# Patient Record
Sex: Female | Born: 1975
Health system: Southern US, Community
[De-identification: ages and names within clinical notes are randomized; demographics above are authoritative.]

## PROBLEM LIST (undated history)

## (undated) DIAGNOSIS — I871 Compression of vein: Secondary | ICD-10-CM

## (undated) DIAGNOSIS — Z8489 Family history of other specified conditions: Secondary | ICD-10-CM

## (undated) DIAGNOSIS — F329 Major depressive disorder, single episode, unspecified: Secondary | ICD-10-CM

## (undated) DIAGNOSIS — D649 Anemia, unspecified: Secondary | ICD-10-CM

## (undated) DIAGNOSIS — I82409 Acute embolism and thrombosis of unspecified deep veins of unspecified lower extremity: Secondary | ICD-10-CM

## (undated) DIAGNOSIS — F4310 Post-traumatic stress disorder, unspecified: Secondary | ICD-10-CM

## (undated) DIAGNOSIS — M797 Fibromyalgia: Secondary | ICD-10-CM

## (undated) DIAGNOSIS — F603 Borderline personality disorder: Secondary | ICD-10-CM

## (undated) DIAGNOSIS — T753XXA Motion sickness, initial encounter: Secondary | ICD-10-CM

## (undated) DIAGNOSIS — Z972 Presence of dental prosthetic device (complete) (partial): Secondary | ICD-10-CM

## (undated) DIAGNOSIS — F32A Depression, unspecified: Secondary | ICD-10-CM

## (undated) DIAGNOSIS — Z95828 Presence of other vascular implants and grafts: Secondary | ICD-10-CM

## (undated) DIAGNOSIS — J45909 Unspecified asthma, uncomplicated: Secondary | ICD-10-CM

## (undated) HISTORY — PX: IVC FILTER PLACEMENT (ARMC HX): HXRAD1551

## (undated) HISTORY — PX: CHOLECYSTECTOMY: SHX55

## (undated) HISTORY — PX: LEG SURGERY: SHX1003

## (undated) HISTORY — PX: BREAST CYST ASPIRATION: SHX578

## (undated) HISTORY — PX: TUBAL LIGATION: SHX77

---

## 2004-06-16 ENCOUNTER — Emergency Department: Payer: Self-pay | Admitting: Emergency Medicine

## 2005-07-10 ENCOUNTER — Emergency Department: Payer: Self-pay | Admitting: Emergency Medicine

## 2006-03-05 ENCOUNTER — Emergency Department: Payer: Self-pay | Admitting: Emergency Medicine

## 2006-07-30 ENCOUNTER — Emergency Department: Payer: Self-pay | Admitting: General Practice

## 2006-10-05 ENCOUNTER — Emergency Department: Payer: Self-pay | Admitting: Internal Medicine

## 2006-11-22 ENCOUNTER — Emergency Department: Payer: Self-pay | Admitting: Emergency Medicine

## 2007-10-27 ENCOUNTER — Emergency Department: Payer: Self-pay | Admitting: Emergency Medicine

## 2008-01-16 ENCOUNTER — Emergency Department: Payer: Self-pay | Admitting: Emergency Medicine

## 2008-06-20 ENCOUNTER — Emergency Department: Payer: Self-pay | Admitting: Emergency Medicine

## 2008-08-20 ENCOUNTER — Emergency Department: Payer: Self-pay | Admitting: Unknown Physician Specialty

## 2008-11-05 ENCOUNTER — Emergency Department: Payer: Self-pay | Admitting: Emergency Medicine

## 2009-01-06 ENCOUNTER — Ambulatory Visit: Payer: Self-pay | Admitting: Oncology

## 2009-01-25 ENCOUNTER — Inpatient Hospital Stay: Payer: Self-pay | Admitting: *Deleted

## 2009-02-03 ENCOUNTER — Emergency Department: Payer: Self-pay | Admitting: Unknown Physician Specialty

## 2009-02-06 ENCOUNTER — Ambulatory Visit: Payer: Self-pay | Admitting: Oncology

## 2009-02-09 ENCOUNTER — Ambulatory Visit: Payer: Self-pay | Admitting: Oncology

## 2009-03-09 ENCOUNTER — Ambulatory Visit: Payer: Self-pay | Admitting: Oncology

## 2009-03-18 ENCOUNTER — Ambulatory Visit: Payer: Self-pay | Admitting: Pain Medicine

## 2009-03-22 ENCOUNTER — Ambulatory Visit: Payer: Self-pay | Admitting: Pain Medicine

## 2009-04-20 ENCOUNTER — Ambulatory Visit: Payer: Self-pay | Admitting: Pain Medicine

## 2009-05-05 ENCOUNTER — Ambulatory Visit: Payer: Self-pay | Admitting: Pain Medicine

## 2009-06-01 ENCOUNTER — Ambulatory Visit: Payer: Self-pay | Admitting: Pain Medicine

## 2009-09-23 ENCOUNTER — Emergency Department: Payer: Self-pay | Admitting: Emergency Medicine

## 2010-04-25 ENCOUNTER — Emergency Department: Payer: Self-pay | Admitting: Emergency Medicine

## 2010-06-15 DIAGNOSIS — F4310 Post-traumatic stress disorder, unspecified: Secondary | ICD-10-CM | POA: Insufficient documentation

## 2010-06-15 DIAGNOSIS — F603 Borderline personality disorder: Secondary | ICD-10-CM | POA: Insufficient documentation

## 2010-06-15 DIAGNOSIS — I82509 Chronic embolism and thrombosis of unspecified deep veins of unspecified lower extremity: Secondary | ICD-10-CM | POA: Insufficient documentation

## 2010-06-15 DIAGNOSIS — G609 Hereditary and idiopathic neuropathy, unspecified: Secondary | ICD-10-CM | POA: Insufficient documentation

## 2010-07-05 DIAGNOSIS — I87009 Postthrombotic syndrome without complications of unspecified extremity: Secondary | ICD-10-CM | POA: Insufficient documentation

## 2010-11-02 DIAGNOSIS — F1190 Opioid use, unspecified, uncomplicated: Secondary | ICD-10-CM | POA: Insufficient documentation

## 2011-02-01 ENCOUNTER — Emergency Department: Payer: Self-pay | Admitting: Emergency Medicine

## 2011-03-04 ENCOUNTER — Emergency Department: Payer: Self-pay | Admitting: Emergency Medicine

## 2011-03-04 LAB — BASIC METABOLIC PANEL
BUN: 5 mg/dL — ABNORMAL LOW (ref 7–18)
Chloride: 108 mmol/L — ABNORMAL HIGH (ref 98–107)
Creatinine: 0.73 mg/dL (ref 0.60–1.30)
EGFR (Non-African Amer.): >60
Glucose: 116 mg/dL — ABNORMAL HIGH (ref 65–99)
Osmolality: 287 (ref 275–301)
Potassium: 4 mmol/L (ref 3.5–5.1)
Sodium: 145 mmol/L (ref 136–145)

## 2011-03-04 LAB — CBC
HCT: 39.6 % (ref 35.0–47.0)
HGB: 13 g/dL (ref 12.0–16.0)
MCV: 89 fL (ref 80–100)
Platelet: 373 10*3/uL (ref 150–440)
RBC: 4.42 10*6/uL (ref 3.80–5.20)
RDW: 17.1 % — ABNORMAL HIGH (ref 11.5–14.5)
WBC: 9.6 10*3/uL (ref 3.6–11.0)

## 2011-03-04 LAB — HEPATIC FUNCTION PANEL A (ARMC)
Bilirubin, Direct: <0.1 mg/dL (ref 0.00–0.20)
Bilirubin,Total: 0.4 mg/dL (ref 0.2–1.0)

## 2011-03-04 LAB — LIPASE, BLOOD: Lipase: 52 U/L — ABNORMAL LOW (ref 73–393)

## 2011-03-04 LAB — PROTIME-INR: INR: 1.2

## 2011-05-08 ENCOUNTER — Emergency Department: Payer: Self-pay | Admitting: Emergency Medicine

## 2011-05-08 LAB — CBC WITH DIFFERENTIAL/PLATELET
Basophil %: 1.1 %
Eosinophil %: 1.3 %
HCT: 41.8 % (ref 35.0–47.0)
Lymphocyte %: 32.1 %
MCV: 88 fL (ref 80–100)
Monocyte %: 5.6 %
Neutrophil #: 8.8 10*3/uL — ABNORMAL HIGH (ref 1.4–6.5)
Neutrophil %: 59.9 %
RBC: 4.74 10*6/uL (ref 3.80–5.20)
RDW: 16.6 % — ABNORMAL HIGH (ref 11.5–14.5)

## 2011-05-08 LAB — URINALYSIS, COMPLETE
RBC,UR: 3014 /HPF (ref 0–5)
Squamous Epithelial: 1
Squamous Epithelial: 2
WBC UR: 37 /HPF (ref 0–5)
WBC UR: 14 /HPF (ref 0–5)

## 2011-05-08 LAB — COMPREHENSIVE METABOLIC PANEL
Albumin: 3.5 g/dL (ref 3.4–5.0)
BUN: 9 mg/dL (ref 7–18)
Bilirubin,Total: 0.4 mg/dL (ref 0.2–1.0)
Calcium, Total: 8.9 mg/dL (ref 8.5–10.1)
Creatinine: 0.96 mg/dL (ref 0.60–1.30)
Glucose: 114 mg/dL — ABNORMAL HIGH (ref 65–99)
Osmolality: 275 (ref 275–301)
SGOT(AST): 29 U/L (ref 15–37)
SGPT (ALT): 27 U/L
Sodium: 138 mmol/L (ref 136–145)
Total Protein: 7.8 g/dL (ref 6.4–8.2)

## 2011-05-08 LAB — DRUG SCREEN, URINE
Amphetamines, Ur Screen: NEGATIVE (ref ?–1000)
Barbiturates, Ur Screen: NEGATIVE (ref ?–200)
Cannabinoid 50 Ng, Ur ~~LOC~~: NEGATIVE (ref ?–50)
Cocaine Metabolite,Ur ~~LOC~~: NEGATIVE (ref ?–300)
MDMA (Ecstasy)Ur Screen: NEGATIVE (ref ?–500)
Methadone, Ur Screen: NEGATIVE (ref ?–300)
Phencyclidine (PCP) Ur S: NEGATIVE (ref ?–25)
Tricyclic, Ur Screen: NEGATIVE (ref ?–1000)

## 2011-05-08 LAB — PREGNANCY, URINE: Pregnancy Test, Urine: NEGATIVE m[IU]/mL

## 2011-05-10 LAB — URINE CULTURE

## 2012-01-22 DIAGNOSIS — G4700 Insomnia, unspecified: Secondary | ICD-10-CM | POA: Insufficient documentation

## 2012-01-22 DIAGNOSIS — J984 Other disorders of lung: Secondary | ICD-10-CM | POA: Insufficient documentation

## 2012-01-22 DIAGNOSIS — G8929 Other chronic pain: Secondary | ICD-10-CM | POA: Insufficient documentation

## 2012-01-22 DIAGNOSIS — F1210 Cannabis abuse, uncomplicated: Secondary | ICD-10-CM | POA: Insufficient documentation

## 2012-01-22 DIAGNOSIS — I2699 Other pulmonary embolism without acute cor pulmonale: Secondary | ICD-10-CM | POA: Insufficient documentation

## 2012-01-22 DIAGNOSIS — Z720 Tobacco use: Secondary | ICD-10-CM | POA: Insufficient documentation

## 2012-01-22 DIAGNOSIS — F32A Depression, unspecified: Secondary | ICD-10-CM | POA: Insufficient documentation

## 2012-01-22 DIAGNOSIS — F329 Major depressive disorder, single episode, unspecified: Secondary | ICD-10-CM | POA: Insufficient documentation

## 2012-01-22 DIAGNOSIS — M545 Low back pain, unspecified: Secondary | ICD-10-CM | POA: Insufficient documentation

## 2012-01-22 DIAGNOSIS — M797 Fibromyalgia: Secondary | ICD-10-CM | POA: Insufficient documentation

## 2012-01-22 DIAGNOSIS — F489 Nonpsychotic mental disorder, unspecified: Secondary | ICD-10-CM | POA: Insufficient documentation

## 2012-07-02 ENCOUNTER — Emergency Department: Payer: Self-pay | Admitting: Emergency Medicine

## 2012-07-02 LAB — CBC
HCT: 31.4 % — ABNORMAL LOW (ref 35.0–47.0)
HGB: 10.1 g/dL — ABNORMAL LOW (ref 12.0–16.0)
MCHC: 32.1 g/dL (ref 32.0–36.0)
Platelet: 658 10*3/uL — ABNORMAL HIGH (ref 150–440)
RBC: 4.5 10*6/uL (ref 3.80–5.20)
RDW: 20.7 % — ABNORMAL HIGH (ref 11.5–14.5)
WBC: 14.1 10*3/uL — ABNORMAL HIGH (ref 3.6–11.0)

## 2012-07-02 LAB — COMPREHENSIVE METABOLIC PANEL
Alkaline Phosphatase: 149 U/L — ABNORMAL HIGH (ref 50–136)
Anion Gap: 7 (ref 7–16)
Bilirubin,Total: 0.2 mg/dL (ref 0.2–1.0)
Calcium, Total: 9.2 mg/dL (ref 8.5–10.1)
Chloride: 105 mmol/L (ref 98–107)
Co2: 25 mmol/L (ref 21–32)
Creatinine: 0.68 mg/dL (ref 0.60–1.30)
EGFR (Non-African Amer.): >60
Osmolality: 272 (ref 275–301)
SGPT (ALT): 14 U/L (ref 12–78)
Total Protein: 7.6 g/dL (ref 6.4–8.2)

## 2012-07-06 ENCOUNTER — Emergency Department: Payer: Self-pay | Admitting: Emergency Medicine

## 2012-07-06 LAB — COMPREHENSIVE METABOLIC PANEL
Alkaline Phosphatase: 147 U/L — ABNORMAL HIGH (ref 50–136)
Anion Gap: 6 — ABNORMAL LOW (ref 7–16)
BUN: 10 mg/dL (ref 7–18)
Calcium, Total: 8.6 mg/dL (ref 8.5–10.1)
EGFR (African American): >60
Osmolality: 280 (ref 275–301)
Potassium: 3.9 mmol/L (ref 3.5–5.1)
SGOT(AST): 11 U/L — ABNORMAL LOW (ref 15–37)
SGPT (ALT): 9 U/L — ABNORMAL LOW (ref 12–78)

## 2012-07-06 LAB — CBC
HCT: 29.4 % — ABNORMAL LOW (ref 35.0–47.0)
HGB: 9.2 g/dL — ABNORMAL LOW (ref 12.0–16.0)
MCHC: 31.3 g/dL — ABNORMAL LOW (ref 32.0–36.0)
MCV: 70 fL — ABNORMAL LOW (ref 80–100)
Platelet: 375 10*3/uL (ref 150–440)
RBC: 4.18 10*6/uL (ref 3.80–5.20)

## 2012-07-06 LAB — PROTIME-INR
INR: 1
Prothrombin Time: 13.1 secs (ref 11.5–14.7)

## 2012-07-13 DIAGNOSIS — F3341 Major depressive disorder, recurrent, in partial remission: Secondary | ICD-10-CM | POA: Insufficient documentation

## 2012-10-19 DIAGNOSIS — D649 Anemia, unspecified: Secondary | ICD-10-CM | POA: Insufficient documentation

## 2012-11-01 ENCOUNTER — Emergency Department: Payer: Self-pay | Admitting: Emergency Medicine

## 2012-11-02 LAB — CBC
HCT: 36.8 % (ref 35.0–47.0)
HGB: 11.8 g/dL — ABNORMAL LOW (ref 12.0–16.0)
MCH: 24.8 pg — ABNORMAL LOW (ref 26.0–34.0)
MCHC: 32.1 g/dL (ref 32.0–36.0)
Platelet: 481 10*3/uL — ABNORMAL HIGH (ref 150–440)
RBC: 4.75 10*6/uL (ref 3.80–5.20)
RDW: 32.3 % — ABNORMAL HIGH (ref 11.5–14.5)
WBC: 12.2 10*3/uL — ABNORMAL HIGH (ref 3.6–11.0)

## 2012-11-02 LAB — COMPREHENSIVE METABOLIC PANEL
BUN: 14 mg/dL (ref 7–18)
Bilirubin,Total: 0.3 mg/dL (ref 0.2–1.0)
Calcium, Total: 9.5 mg/dL (ref 8.5–10.1)
EGFR (African American): >60
Glucose: 128 mg/dL — ABNORMAL HIGH (ref 65–99)
Osmolality: 270 (ref 275–301)
Potassium: 3.8 mmol/L (ref 3.5–5.1)
SGPT (ALT): 17 U/L (ref 12–78)
Sodium: 134 mmol/L — ABNORMAL LOW (ref 136–145)

## 2012-11-02 LAB — URINALYSIS, COMPLETE
Glucose,UR: NEGATIVE mg/dL (ref 0–75)
Hyaline Cast: 10
Leukocyte Esterase: NEGATIVE
RBC,UR: 2 /HPF (ref 0–5)
Specific Gravity: 1.029 (ref 1.003–1.030)
WBC UR: 10 /HPF (ref 0–5)

## 2012-11-02 LAB — LIPASE, BLOOD: Lipase: 130 U/L (ref 73–393)

## 2013-06-20 DIAGNOSIS — I770 Arteriovenous fistula, acquired: Secondary | ICD-10-CM | POA: Insufficient documentation

## 2013-11-21 DIAGNOSIS — M2550 Pain in unspecified joint: Secondary | ICD-10-CM | POA: Insufficient documentation

## 2014-06-18 ENCOUNTER — Ambulatory Visit (HOSPITAL_COMMUNITY)
Admission: RE | Admit: 2014-06-18 | Discharge: 2014-06-18 | Disposition: A | Discharge status: Home / Self Care | Payer: Self-pay | Source: Ambulatory Visit | Attending: Family Medicine | Admitting: Family Medicine

## 2014-06-18 DIAGNOSIS — Z8269 Family history of other diseases of the musculoskeletal system and connective tissue: Secondary | ICD-10-CM | POA: Insufficient documentation

## 2014-06-18 DIAGNOSIS — Z31430 Encounter of female for testing for genetic disease carrier status for procreative management: Secondary | ICD-10-CM | POA: Insufficient documentation

## 2014-07-24 ENCOUNTER — Other Ambulatory Visit (HOSPITAL_COMMUNITY): Payer: Self-pay | Admitting: Maternal and Fetal Medicine

## 2015-02-09 ENCOUNTER — Inpatient Hospital Stay
Admission: EM | Admit: 2015-02-09 | Discharge: 2015-02-09 | DRG: 081 | Disposition: A | Discharge status: Home / Self Care | Payer: PPO | Attending: Internal Medicine | Admitting: Internal Medicine

## 2015-02-09 ENCOUNTER — Emergency Department: Payer: PPO

## 2015-02-09 ENCOUNTER — Encounter: Payer: Self-pay | Admitting: Emergency Medicine

## 2015-02-09 DIAGNOSIS — Z888 Allergy status to other drugs, medicaments and biological substances status: Secondary | ICD-10-CM | POA: Diagnosis not present

## 2015-02-09 DIAGNOSIS — R4781 Slurred speech: Secondary | ICD-10-CM | POA: Diagnosis not present

## 2015-02-09 DIAGNOSIS — R4182 Altered mental status, unspecified: Secondary | ICD-10-CM

## 2015-02-09 DIAGNOSIS — Z86718 Personal history of other venous thrombosis and embolism: Secondary | ICD-10-CM

## 2015-02-09 DIAGNOSIS — F17200 Nicotine dependence, unspecified, uncomplicated: Secondary | ICD-10-CM | POA: Diagnosis not present

## 2015-02-09 DIAGNOSIS — Z886 Allergy status to analgesic agent status: Secondary | ICD-10-CM

## 2015-02-09 DIAGNOSIS — Z881 Allergy status to other antibiotic agents status: Secondary | ICD-10-CM

## 2015-02-09 DIAGNOSIS — W1830XA Fall on same level, unspecified, initial encounter: Secondary | ICD-10-CM | POA: Diagnosis present

## 2015-02-09 DIAGNOSIS — T43025A Adverse effect of tetracyclic antidepressants, initial encounter: Secondary | ICD-10-CM | POA: Diagnosis not present

## 2015-02-09 DIAGNOSIS — F419 Anxiety disorder, unspecified: Secondary | ICD-10-CM | POA: Diagnosis present

## 2015-02-09 DIAGNOSIS — R400 Somnolence: Principal | ICD-10-CM | POA: Diagnosis present

## 2015-02-09 DIAGNOSIS — I82519 Chronic embolism and thrombosis of unspecified femoral vein: Secondary | ICD-10-CM | POA: Diagnosis not present

## 2015-02-09 DIAGNOSIS — Y92000 Kitchen of unspecified non-institutional (private) residence as  the place of occurrence of the external cause: Secondary | ICD-10-CM | POA: Diagnosis not present

## 2015-02-09 DIAGNOSIS — T50901A Poisoning by unspecified drugs, medicaments and biological substances, accidental (unintentional), initial encounter: Secondary | ICD-10-CM | POA: Diagnosis present

## 2015-02-09 DIAGNOSIS — F329 Major depressive disorder, single episode, unspecified: Secondary | ICD-10-CM | POA: Diagnosis not present

## 2015-02-09 LAB — URINE DRUG SCREEN, QUALITATIVE (ARMC ONLY)
AMPHETAMINES, UR SCREEN: NOT DETECTED
Barbiturates, Ur Screen: NOT DETECTED
Benzodiazepine, Ur Scrn: NOT DETECTED
CANNABINOID 50 NG, UR ~~LOC~~: POSITIVE — AB
COCAINE METABOLITE, UR ~~LOC~~: NOT DETECTED
MDMA (ECSTASY) UR SCREEN: NOT DETECTED
Methadone Scn, Ur: NOT DETECTED
OPIATE, UR SCREEN: NOT DETECTED
PHENCYCLIDINE (PCP) UR S: NOT DETECTED
Tricyclic, Ur Screen: NOT DETECTED

## 2015-02-09 LAB — BASIC METABOLIC PANEL
ANION GAP: 6 (ref 5–15)
BUN: 18 mg/dL (ref 6–20)
CALCIUM: 9.4 mg/dL (ref 8.9–10.3)
CHLORIDE: 104 mmol/L (ref 101–111)
CO2: 30 mmol/L (ref 22–32)
Creatinine, Ser: 0.89 mg/dL (ref 0.44–1.00)
GFR calc Af Amer: >60 mL/min (ref >60)
GFR calc non Af Amer: >60 mL/min (ref >60)
GLUCOSE: 100 mg/dL — AB (ref 65–99)
Potassium: 4.3 mmol/L (ref 3.5–5.1)
Sodium: 140 mmol/L (ref 135–145)

## 2015-02-09 LAB — CBC
HEMATOCRIT: 38 % (ref 35.0–47.0)
HEMOGLOBIN: 12.8 g/dL (ref 12.0–16.0)
MCH: 32.5 pg (ref 26.0–34.0)
MCHC: 33.6 g/dL (ref 32.0–36.0)
MCV: 96.7 fL (ref 80.0–100.0)
Platelets: 391 10*3/uL (ref 150–440)
RBC: 3.93 MIL/uL (ref 3.80–5.20)
RDW: 16.2 % — ABNORMAL HIGH (ref 11.5–14.5)
WBC: 13.3 10*3/uL — ABNORMAL HIGH (ref 3.6–11.0)

## 2015-02-09 LAB — TROPONIN I

## 2015-02-09 MED ORDER — POLYETHYLENE GLYCOL 3350 17 G PO PACK: 17.0000 g | PACK | Freq: Every day | ORAL | Status: DC | PRN

## 2015-02-09 MED ORDER — ACETAMINOPHEN 650 MG RE SUPP: 650.0000 mg | Freq: Four times a day (QID) | RECTAL | Status: DC | PRN

## 2015-02-09 MED ORDER — ACETAMINOPHEN 325 MG PO TABS: 650.0000 mg | ORAL_TABLET | Freq: Four times a day (QID) | ORAL | Status: DC | PRN

## 2015-02-09 MED ORDER — ONDANSETRON HCL 4 MG PO TABS: 4.0000 mg | ORAL_TABLET | Freq: Four times a day (QID) | ORAL | Status: DC | PRN

## 2015-02-09 MED ORDER — ONDANSETRON HCL 4 MG/2ML IJ SOLN: 4.0000 mg | Freq: Four times a day (QID) | INTRAMUSCULAR | Status: DC | PRN

## 2015-02-09 MED ORDER — SODIUM CHLORIDE 0.9 % IV SOLN: INTRAVENOUS | Status: DC

## 2015-02-09 MED ORDER — BISACODYL 5 MG PO TBEC: 5.0000 mg | DELAYED_RELEASE_TABLET | Freq: Every day | ORAL | Status: DC | PRN

## 2015-02-09 MED ORDER — ALBUTEROL SULFATE (2.5 MG/3ML) 0.083% IN NEBU: 2.5000 mg | INHALATION_SOLUTION | RESPIRATORY_TRACT | Status: DC | PRN

## 2015-02-09 MED ORDER — SODIUM CHLORIDE 0.9 % IJ SOLN: 3.0000 mL | Freq: Two times a day (BID) | INTRAMUSCULAR | Status: DC
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
## 2015-02-09 NOTE — ED Notes (Signed)
Pt went to CT

## 2015-02-09 NOTE — ED Provider Notes (Signed)
[facility] Emergency Department Provider Note  ____________________________________________  Time seen: 12:40 AM  I have reviewed the triage vital signs and the nursing notes.  History Limited secondary to altered mental status HISTORY  Chief Complaint Altered Mental Status    HPI Lindsey Willis is a 40 y.o. female presents via EMS with altered mental status. Per EMS initial call was for loss of consciousness and patient was found to be minimally conscious on the presentation. EMS personnel stated of 2 mg of Narcan was given at which time the patient alertness improved and she was able to converse 8. Patient very somnolent on presentation to the emergency department with a respiratory rate of 10. Patient admits to recent stent placement in her left lower extremity at outside hospital. Patient states that she "feels fine"    Past medical history  There are no active problems to display for this patient.   Past surgical history Bilateral lower extremity vascular stent placement No current outpatient prescriptions on file.  Allergies Review of patient's allergies indicates not on file.  No family history on file.  Social History Social History  Substance Use Topics  . Smoking status: Not on file  . Smokeless tobacco: Not on file  . Alcohol Use: Not on file    Review of Systems  Constitutional: Negative for fever. Eyes: Negative for visual changes. ENT: Negative for sore throat. Cardiovascular: Negative for chest pain. Respiratory: Negative for shortness of breath. Gastrointestinal: Negative for abdominal pain, vomiting and diarrhea. Genitourinary: Negative for dysuria. Musculoskeletal: Negative for back pain. Skin: Negative for rash. Neurological: Negative for headaches, focal weakness or numbness.   10-point ROS otherwise negative.  ____________________________________________   PHYSICAL EXAM:  VITAL SIGNS: ED Triage Vitals  Enc  Vitals Group     BP --      Pulse --      Resp --      Temp --      Temp src --      SpO2 --      Weight --      Height --      Head Cir --      Peak Flow --      Pain Score --      Pain Loc --      Pain Edu? --      Excl. in GC? --     Constitutional: Very somnolent but arousable to verbal stimuli with rapid return to sleep Eyes: Conjunctivae are normal. PERRL. Normal extraocular movements. ENT   Head: Normocephalic and atraumatic.   Nose: No congestion/rhinnorhea.   Mouth/Throat: Mucous membranes are moist.   Neck: No stridor. Hematological/Lymphatic/Immunilogical: No cervical lymphadenopathy. Cardiovascular: Normal rate, regular rhythm. Normal and symmetric distal pulses are present in all extremities. No murmurs, rubs, or gallops. Respiratory: Normal respiratory effort without tachypnea nor retractions. Breath sounds are clear and equal bilaterally. No wheezes/rales/rhonchi. Gastrointestinal: Soft and nontender. No distention. There is no CVA tenderness. Genitourinary: deferred Musculoskeletal: Nontender with normal range of motion in all extremities. No joint effusions.  No lower extremity tenderness nor edema. Neurologic:  Slurred speech. No gross focal neurologic deficits are appreciated. Speech is normal.  Skin:  Skin is warm, dry and intact. No rash noted. Psychiatric: Mood and affect are normal. Speech and behavior are normal. Patient exhibits appropriate insight and judgment.  ____________________________________________    LABS (pertinent positives/negatives)  Labs Reviewed  BASIC METABOLIC PANEL - Abnormal; Notable for the following:    Glucose,  Bld 100 (*)    All other components within normal limits  CBC - Abnormal; Notable for the following:    WBC 13.3 (*)    RDW 16.2 (*)    All other components within normal limits  URINE DRUG SCREEN, QUALITATIVE ([facility] ONLY) - Abnormal; Notable for the following:    Cannabinoid 50 Ng, Ur [place] POSITIVE (*)     All other components within normal limits  TROPONIN I     ____________________________________________   EKG  ED ECG REPORT I, Coner Gibbard, [place], the attending physician, personally viewed and interpreted this ECG.   Date: 02/09/2015  EKG Time: 12:30 AM  Rate: 75  Rhythm: Normal sinus rhythm  Axis: None  Intervals: Normal  ST&T Change: None   ____________________________________________    RADIOLOGY  CT Head Wo Contrast (Final result) Result time: 02/09/15 02:35:03   Final result by Rad Results In Interface (02/09/15 02:35:03)   Narrative:   CLINICAL DATA: Acute onset of loss of consciousness. Slurred speech. Initial encounter.  EXAM: CT HEAD WITHOUT CONTRAST  TECHNIQUE: Contiguous axial images were obtained from the base of the skull through the vertex without intravenous contrast.  COMPARISON: CT of the head performed 01/25/2009  FINDINGS: There is no evidence of acute infarction, mass lesion, or intra- or extra-axial hemorrhage on CT.  The posterior fossa, including the cerebellum, brainstem and fourth ventricle, is within normal limits. The third and lateral ventricles, and basal ganglia are unremarkable in appearance. The cerebral hemispheres are symmetric in appearance, with normal gray-white differentiation. No mass effect or midline shift is seen.  There is no evidence of fracture; visualized osseous structures are unremarkable in appearance. The orbits are within normal limits. Mucosal thickening is noted at the right maxillary sinus. The remaining paranasal sinuses and mastoid air cells are well-aerated. No significant soft tissue abnormalities are seen.  IMPRESSION: 1. No acute intracranial pathology seen on CT. 2. Mucosal thickening at the right maxillary sinus.   Electronically Signed By: Roanna Raider M.D. On: 02/09/2015 02:35        INITIAL IMPRESSION / ASSESSMENT AND PLAN / ED COURSE  Pertinent labs & imaging  results that were available during my care of the patient were reviewed by me and considered in my medical decision making (see chart for details).  Patient remained very somnolent but aroused to verbal stimuli with rapid return to sleep during emergency Department stay laboratory data positive for marijuana. Patient admits to taking 1 Percocet yesterday. No clear etiology for the patient's profound somnolence and slurred speech. I discussed with the patient and her daughters which she gave permission for me to speak freely in front of them that I recommend she be admitted to the hospital for further evaluation. Patient and agrees to plan. Patient evaluated by Dr. Marlene Bast at which point she adamantly stated that she wants to leave AGAINST MEDICAL ADVICE ____________________________________________   FINAL CLINICAL IMPRESSION(S) / ED DIAGNOSES  Final diagnoses:  Altered mental status, unspecified altered mental status type      Darci Current, MD 02/09/15 [id number]

## 2015-02-09 NOTE — Consult Note (Signed)
[facility] [place] at [facility]   PATIENT NAME: Lindsey Willis    MR#:  [id number]  DATE OF BIRTH:  [birth year]  DATE OF ADMISSION:  02/09/2015  PRIMARY CARE PHYSICIAN: No primary care provider on file.   CONSULT REQUESTING/REFERRING PHYSICIAN: Dr. Manson Passey - ED  REASON FOR CONSULT: Drowziness  CHIEF COMPLAINT:   Chief Complaint  Patient presents with  . Altered Mental Status    HISTORY OF PRESENT ILLNESS:  Lindsey Willis  is a 40 y.o. female with a known history of depression, DVT, anxiety presented to the emergency room after family found her on the floor in the kitchen extremely drowsy. Patient mentions that she has been feeling lightheaded with increased somnolence since starting Remeron 15 mg daily which was prescribed in place of Abilify recently by her primary care physician. She was trying to fix herself some fluid in the kitchen felt lightheaded and fell to the floor. Patient has been drowsy in the emergency room. Presently patient is alert and oriented 3 and is able to clearly give me history off her medication changes and also regarding her recent hospital admission at [facility]. No suicidal ideation. Patient has ambulated on her own to the bathroom and back. She does have some pain in her left lower extremity for which she has been investigated at [facility] and has chronic DVT.  PAST MEDICAL HISTORY:  History reviewed. No pertinent past medical history.  PAST SURGICAL HISTOIRY:  History reviewed. No pertinent past surgical history.  SOCIAL HISTORY:   Social History  Substance Use Topics  . Smoking status: Current Every Day Smoker  . Smokeless tobacco: Not on file  . Alcohol Use: No    FAMILY HISTORY:  History reviewed. No pertinent family history.  DRUG ALLERGIES:   Allergies  Allergen Reactions  . Vicodin [Hydrocodone-Acetaminophen] Other (See Comments)    Liver problems   . Iodine Rash  . Keflex [Cephalexin] Rash  . Levaquin [Levofloxacin In  D5w] Rash  . Percocet [Oxycodone-Acetaminophen] Rash    REVIEW OF SYSTEMS:   ROS  CONSTITUTIONAL: No fever. Positive Fatigue. EYES: No blurred or double vision.  EARS, NOSE, AND THROAT: No tinnitus or ear pain.  RESPIRATORY: No cough, shortness of breath, wheezing or hemoptysis.  CARDIOVASCULAR: No chest pain, orthopnea, edema.  GASTROINTESTINAL: No nausea, vomiting, diarrhea or abdominal pain.  GENITOURINARY: No dysuria, hematuria.  ENDOCRINE: No polyuria, nocturia,  HEMATOLOGY: No anemia, easy bruising or bleeding. SKIN: No rash or lesion. MUSCULOSKELETAL: No joint pain or arthritis. NEUROLOGIC: No tingling, numbness, weakness. PSYCHIATRY: anxiety and depression.  MEDICATIONS AT HOME:   Prior to Admission medications   Medication Sig Start Date End Date Taking? Authorizing Provider  albuterol (PROVENTIL HFA;VENTOLIN HFA) 108 (90 Base) MCG/ACT inhaler Inhale 1-2 puffs into the lungs every 6 (six) hours as needed for wheezing or shortness of breath (Patient does not use but is active prescription).   Yes Historical Provider, MD  clonazePAM (KLONOPIN) 0.5 MG tablet Take 0.5 mg by mouth 2 (two) times daily.   Yes Historical Provider, MD  clopidogrel (PLAVIX) 75 MG tablet Take 75 mg by mouth daily.   Yes Historical Provider, MD  DULoxetine (CYMBALTA) 60 MG capsule Take 60 mg by mouth daily.   Yes Historical Provider, MD  gabapentin (NEURONTIN) 800 MG tablet Take 800 mg by mouth 4 (four) times daily.   Yes Historical Provider, MD  mirtazapine (REMERON) 15 MG tablet Take 30 mg by mouth at bedtime.   Yes Historical Provider, MD  rivaroxaban (XARELTO) 20 MG TABS tablet Take 20 mg by mouth daily.   Yes Historical Provider, MD  rOPINIRole (REQUIP) 1 MG tablet Take 1 mg by mouth at bedtime.   Yes Historical Provider, MD  tiotropium (SPIRIVA) 18 MCG inhalation capsule Place 18 mcg into inhaler and inhale daily. Active prescription, patient just doesn't use   Yes Historical Provider, MD       VITAL SIGNS:  Blood pressure 106/66, pulse 83, temperature 97.5 F (36.4 C), temperature source Oral, resp. rate 16, height 5\' 3"  (1.6 m), weight 83.915 kg (185 lb), last menstrual period 01/27/2015, SpO2 95 %.  PHYSICAL EXAMINATION:  GENERAL:  40 y.o.-year-old patient lying in the bed with no acute distress.  EYES: Pupils equal, round, reactive to light and accommodation. No scleral icterus. Extraocular muscles intact.  HEENT: Head atraumatic, normocephalic. Oropharynx and nasopharynx clear.  NECK:  Supple, no jugular venous distention. No thyroid enlargement, no tenderness.  LUNGS: Normal breath sounds bilaterally, no wheezing, rales,rhonchi or crepitation. No use of accessory muscles of respiration.  CARDIOVASCULAR: S1, S2 normal. No murmurs, rubs, or gallops.  ABDOMEN: Soft, nontender, nondistended. Bowel sounds present. No organomegaly or mass.  EXTREMITIES: No pedal edema, cyanosis, or clubbing.  NEUROLOGIC: Cranial nerves II through XII are intact. Muscle strength 5/5 in all extremities. Sensation intact. Gait not checked.  PSYCHIATRIC: The patient is alert and oriented x 3.  SKIN: No obvious rash, lesion, or ulcer.   LABORATORY PANEL:   CBC  Recent Labs Lab 02/09/15 0049  WBC 13.3*  HGB 12.8  HCT 38.0  PLT 391   ------------------------------------------------------------------------------------------------------------------  Chemistries   Recent Labs Lab 02/09/15 0049  NA 140  K 4.3  CL 104  CO2 30  GLUCOSE 100*  BUN 18  CREATININE 0.89  CALCIUM 9.4   ------------------------------------------------------------------------------------------------------------------  Cardiac Enzymes  Recent Labs Lab 02/09/15 0049  TROPONINI <0.03   ------------------------------------------------------------------------------------------------------------------  RADIOLOGY:  Ct Head Wo Contrast  02/09/2015  CLINICAL DATA:  Acute onset of loss of consciousness.  Slurred speech. Initial encounter. EXAM: CT HEAD WITHOUT CONTRAST TECHNIQUE: Contiguous axial images were obtained from the base of the skull through the vertex without intravenous contrast. COMPARISON:  CT of the head performed 01/25/2009 FINDINGS: There is no evidence of acute infarction, mass lesion, or intra- or extra-axial hemorrhage on CT. The posterior fossa, including the cerebellum, brainstem and fourth ventricle, is within normal limits. The third and lateral ventricles, and basal ganglia are unremarkable in appearance. The cerebral hemispheres are symmetric in appearance, with normal gray-white differentiation. No mass effect or midline shift is seen. There is no evidence of fracture; visualized osseous structures are unremarkable in appearance. The orbits are within normal limits. Mucosal thickening is noted at the right maxillary sinus. The remaining paranasal sinuses and mastoid air cells are well-aerated. No significant soft tissue abnormalities are seen. IMPRESSION: 1. No acute intracranial pathology seen on CT. 2. Mucosal thickening at the right maxillary sinus. Electronically Signed   By: Roanna RaiderJeffery  Chang M.D.   On: 02/09/2015 02:35    EKG:   Orders placed or performed during the hospital encounter of 02/09/15  . ED EKG  . ED EKG  . EKG 12-Lead  . EKG 12-Lead    IMPRESSION AND PLAN:   * Somnolence due to new medication Remeron. Patient was found extremely drowsy on the floor at home and brought to the emergency room.  Advised patient to cut Remeron in half to 7.5 mg daily at bedtime for bed. Follow-up with  primary care physician. Urine drug screen negative for opiates or benzos. No suicidal ideation. Patient has ambulated to the bathroom and back on her own. Is alert and oriented 3 and is requesting discharge home.  * History of DVT Continue blood thinners    All the records are reviewed and case discussed with Consulting provider. Management plans discussed with the  patient, family and they are in agreement.  CODE STATUS: FULL  TOTAL TIME TAKING CARE OF THIS PATIENT: 40 minutes.    Milagros Loll R M.D on 02/09/2015 at 5:36 AM  Between 7am to 6pm - Pager - [phone]  After 6pm go to [email or web address] - password [email or web address]  [facility]  Office  [phone]  CC: Primary care Physician: No primary care provider on file.   Note: This dictation was prepared with Dragon dictation along with smaller phrase technology. Any transcriptional errors that result from this process are unintentional.

## 2015-02-09 NOTE — ED Notes (Signed)
Pt came out of her room in the ED saying: "take this F###   thing off of my arm, take all this cables off of me and give me my discharge papers so that I can F### leave. There is nothing wrong with me, my doctor changed my medication and is making me more tired than usual but I am fine. I am going to see my doctor in 2 days and I will tell him to adjust my medication. YOU GUYS CAN NOT KEEP ME HERE, TAKE ALL THIS SH### OFF CUS I AM LEAVING."  Pt was advised by Dr. Manson PasseyBrown to stay in the hospital for observation, and the Pt decided to leave against medical advise.

## 2015-02-11 DIAGNOSIS — I82402 Acute embolism and thrombosis of unspecified deep veins of left lower extremity: Secondary | ICD-10-CM | POA: Diagnosis not present

## 2015-02-11 DIAGNOSIS — M2550 Pain in unspecified joint: Secondary | ICD-10-CM | POA: Diagnosis not present

## 2015-02-11 DIAGNOSIS — F331 Major depressive disorder, recurrent, moderate: Secondary | ICD-10-CM | POA: Diagnosis not present

## 2015-02-11 DIAGNOSIS — F419 Anxiety disorder, unspecified: Secondary | ICD-10-CM | POA: Diagnosis not present

## 2015-02-22 ENCOUNTER — Emergency Department
Admission: EM | Admit: 2015-02-22 | Discharge: 2015-02-22 | Disposition: A | Discharge status: Home / Self Care | Payer: PPO | Attending: Emergency Medicine | Admitting: Emergency Medicine

## 2015-02-22 ENCOUNTER — Encounter: Payer: Self-pay | Admitting: Emergency Medicine

## 2015-02-22 DIAGNOSIS — Z7902 Long term (current) use of antithrombotics/antiplatelets: Secondary | ICD-10-CM | POA: Insufficient documentation

## 2015-02-22 DIAGNOSIS — F17200 Nicotine dependence, unspecified, uncomplicated: Secondary | ICD-10-CM | POA: Diagnosis not present

## 2015-02-22 DIAGNOSIS — Z79899 Other long term (current) drug therapy: Secondary | ICD-10-CM | POA: Insufficient documentation

## 2015-02-22 DIAGNOSIS — J020 Streptococcal pharyngitis: Secondary | ICD-10-CM | POA: Diagnosis not present

## 2015-02-22 DIAGNOSIS — J029 Acute pharyngitis, unspecified: Secondary | ICD-10-CM | POA: Diagnosis not present

## 2015-02-22 HISTORY — DX: Post-traumatic stress disorder, unspecified: F43.10

## 2015-02-22 HISTORY — DX: Depression, unspecified: F32.A

## 2015-02-22 HISTORY — DX: Unspecified asthma, uncomplicated: J45.909

## 2015-02-22 HISTORY — DX: Borderline personality disorder: F60.3

## 2015-02-22 HISTORY — DX: Anemia, unspecified: D64.9

## 2015-02-22 HISTORY — DX: Compression of vein: I87.1

## 2015-02-22 HISTORY — DX: Major depressive disorder, single episode, unspecified: F32.9

## 2015-02-22 HISTORY — DX: Acute embolism and thrombosis of unspecified deep veins of unspecified lower extremity: I82.409

## 2015-02-22 HISTORY — DX: Fibromyalgia: M79.7

## 2015-02-22 MED ORDER — MAGIC MOUTHWASH W/LIDOCAINE: 5.0000 mL | Freq: Four times a day (QID) | ORAL | Status: DC

## 2015-02-22 MED ORDER — DOXYCYCLINE HYCLATE 100 MG PO TABS: 100.0000 mg | ORAL_TABLET | Freq: Two times a day (BID) | ORAL | Status: DC

## 2015-02-22 NOTE — Discharge Instructions (Signed)

## 2015-02-22 NOTE — ED Notes (Signed)
C/o "white stuff in my throat. I think I have strep".  Woke up this morning with sore throat.  Reports that the rapid strep test never works on her.  States " can I just have a shot instead of pills.  I take enough pills".
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
## 2015-02-24 DIAGNOSIS — J029 Acute pharyngitis, unspecified: Secondary | ICD-10-CM | POA: Diagnosis not present

## 2015-03-10 DIAGNOSIS — H538 Other visual disturbances: Secondary | ICD-10-CM | POA: Diagnosis not present

## 2015-03-11 DIAGNOSIS — F419 Anxiety disorder, unspecified: Secondary | ICD-10-CM | POA: Diagnosis not present

## 2015-04-08 ENCOUNTER — Encounter: Payer: Self-pay | Admitting: Psychiatry

## 2015-04-08 ENCOUNTER — Ambulatory Visit (INDEPENDENT_AMBULATORY_CARE_PROVIDER_SITE_OTHER): Payer: PPO | Admitting: Psychiatry

## 2015-04-08 ENCOUNTER — Ambulatory Visit: Payer: PPO | Admitting: Psychiatry

## 2015-04-08 DIAGNOSIS — F339 Major depressive disorder, recurrent, unspecified: Secondary | ICD-10-CM

## 2015-04-08 DIAGNOSIS — I871 Compression of vein: Secondary | ICD-10-CM | POA: Insufficient documentation

## 2015-04-08 DIAGNOSIS — I82409 Acute embolism and thrombosis of unspecified deep veins of unspecified lower extremity: Secondary | ICD-10-CM | POA: Insufficient documentation

## 2015-04-08 MED ORDER — DULOXETINE HCL 60 MG PO CPEP: 60.0000 mg | ORAL_CAPSULE | Freq: Two times a day (BID) | ORAL | Status: DC

## 2015-04-08 MED ORDER — MIRTAZAPINE 30 MG PO TABS: 30.0000 mg | ORAL_TABLET | Freq: Every day | ORAL | Status: AC
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
## 2015-05-06 ENCOUNTER — Ambulatory Visit: Payer: PPO | Admitting: Psychiatry

## 2015-05-28 DIAGNOSIS — Z79899 Other long term (current) drug therapy: Secondary | ICD-10-CM | POA: Diagnosis not present

## 2015-05-28 DIAGNOSIS — F1310 Sedative, hypnotic or anxiolytic abuse, uncomplicated: Secondary | ICD-10-CM | POA: Diagnosis not present

## 2015-05-28 DIAGNOSIS — Z888 Allergy status to other drugs, medicaments and biological substances status: Secondary | ICD-10-CM | POA: Diagnosis not present

## 2015-05-28 DIAGNOSIS — Z885 Allergy status to narcotic agent status: Secondary | ICD-10-CM | POA: Diagnosis not present

## 2015-05-28 DIAGNOSIS — G8929 Other chronic pain: Secondary | ICD-10-CM | POA: Diagnosis not present

## 2015-05-28 DIAGNOSIS — F329 Major depressive disorder, single episode, unspecified: Secondary | ICD-10-CM | POA: Diagnosis not present

## 2015-05-28 DIAGNOSIS — Z7951 Long term (current) use of inhaled steroids: Secondary | ICD-10-CM | POA: Diagnosis not present

## 2015-05-28 DIAGNOSIS — F4310 Post-traumatic stress disorder, unspecified: Secondary | ICD-10-CM | POA: Diagnosis not present

## 2015-05-28 DIAGNOSIS — R109 Unspecified abdominal pain: Secondary | ICD-10-CM | POA: Diagnosis not present

## 2015-05-28 DIAGNOSIS — Z7902 Long term (current) use of antithrombotics/antiplatelets: Secondary | ICD-10-CM | POA: Diagnosis not present

## 2015-05-28 DIAGNOSIS — R110 Nausea: Secondary | ICD-10-CM | POA: Diagnosis not present

## 2015-05-28 DIAGNOSIS — Z881 Allergy status to other antibiotic agents status: Secondary | ICD-10-CM | POA: Diagnosis not present

## 2015-05-28 DIAGNOSIS — F1290 Cannabis use, unspecified, uncomplicated: Secondary | ICD-10-CM | POA: Diagnosis not present

## 2015-05-28 DIAGNOSIS — Z91041 Radiographic dye allergy status: Secondary | ICD-10-CM | POA: Diagnosis not present

## 2015-05-31 DIAGNOSIS — R1084 Generalized abdominal pain: Secondary | ICD-10-CM | POA: Diagnosis not present

## 2015-06-10 DIAGNOSIS — F329 Major depressive disorder, single episode, unspecified: Secondary | ICD-10-CM | POA: Diagnosis not present

## 2015-06-10 DIAGNOSIS — I871 Compression of vein: Secondary | ICD-10-CM | POA: Diagnosis not present

## 2015-06-10 DIAGNOSIS — K760 Fatty (change of) liver, not elsewhere classified: Secondary | ICD-10-CM | POA: Diagnosis not present

## 2015-06-10 DIAGNOSIS — R918 Other nonspecific abnormal finding of lung field: Secondary | ICD-10-CM | POA: Diagnosis not present

## 2015-06-10 DIAGNOSIS — G8929 Other chronic pain: Secondary | ICD-10-CM | POA: Diagnosis not present

## 2015-06-10 DIAGNOSIS — R1084 Generalized abdominal pain: Secondary | ICD-10-CM | POA: Diagnosis not present

## 2015-06-10 DIAGNOSIS — N179 Acute kidney failure, unspecified: Secondary | ICD-10-CM | POA: Diagnosis not present

## 2015-06-10 DIAGNOSIS — R1011 Right upper quadrant pain: Secondary | ICD-10-CM | POA: Diagnosis not present

## 2015-06-10 DIAGNOSIS — R9431 Abnormal electrocardiogram [ECG] [EKG]: Secondary | ICD-10-CM | POA: Diagnosis not present

## 2015-06-10 DIAGNOSIS — R109 Unspecified abdominal pain: Secondary | ICD-10-CM | POA: Diagnosis not present

## 2015-06-10 DIAGNOSIS — I82513 Chronic embolism and thrombosis of femoral vein, bilateral: Secondary | ICD-10-CM | POA: Diagnosis not present

## 2015-06-10 DIAGNOSIS — R0602 Shortness of breath: Secondary | ICD-10-CM | POA: Diagnosis not present

## 2015-06-10 DIAGNOSIS — K659 Peritonitis, unspecified: Secondary | ICD-10-CM | POA: Diagnosis not present

## 2015-06-10 DIAGNOSIS — R079 Chest pain, unspecified: Secondary | ICD-10-CM | POA: Diagnosis not present

## 2015-06-10 DIAGNOSIS — A549 Gonococcal infection, unspecified: Secondary | ICD-10-CM | POA: Diagnosis not present

## 2015-06-10 DIAGNOSIS — K529 Noninfective gastroenteritis and colitis, unspecified: Secondary | ICD-10-CM | POA: Diagnosis not present

## 2015-06-10 DIAGNOSIS — R112 Nausea with vomiting, unspecified: Secondary | ICD-10-CM | POA: Diagnosis not present

## 2015-06-10 DIAGNOSIS — K91872 Postprocedural seroma of a digestive system organ or structure following a digestive system procedure: Secondary | ICD-10-CM | POA: Diagnosis not present

## 2015-06-10 DIAGNOSIS — Z1619 Resistance to other specified beta lactam antibiotics: Secondary | ICD-10-CM | POA: Diagnosis not present

## 2015-06-10 DIAGNOSIS — N733 Female acute pelvic peritonitis: Secondary | ICD-10-CM | POA: Diagnosis not present

## 2015-06-10 DIAGNOSIS — I82509 Chronic embolism and thrombosis of unspecified deep veins of unspecified lower extremity: Secondary | ICD-10-CM | POA: Diagnosis not present

## 2015-06-10 DIAGNOSIS — G2581 Restless legs syndrome: Secondary | ICD-10-CM | POA: Diagnosis not present

## 2015-06-10 DIAGNOSIS — M797 Fibromyalgia: Secondary | ICD-10-CM | POA: Diagnosis not present

## 2015-06-10 DIAGNOSIS — F4310 Post-traumatic stress disorder, unspecified: Secondary | ICD-10-CM | POA: Diagnosis not present

## 2015-06-10 DIAGNOSIS — K210 Gastro-esophageal reflux disease with esophagitis: Secondary | ICD-10-CM | POA: Diagnosis not present

## 2015-06-10 DIAGNOSIS — K5100 Ulcerative (chronic) pancolitis without complications: Secondary | ICD-10-CM | POA: Diagnosis not present

## 2015-06-10 DIAGNOSIS — F17210 Nicotine dependence, cigarettes, uncomplicated: Secondary | ICD-10-CM | POA: Diagnosis not present

## 2015-06-10 DIAGNOSIS — K650 Generalized (acute) peritonitis: Secondary | ICD-10-CM | POA: Diagnosis not present

## 2015-06-10 DIAGNOSIS — F1210 Cannabis abuse, uncomplicated: Secondary | ICD-10-CM | POA: Diagnosis not present

## 2015-06-10 DIAGNOSIS — Z9049 Acquired absence of other specified parts of digestive tract: Secondary | ICD-10-CM | POA: Diagnosis not present

## 2015-06-10 DIAGNOSIS — R188 Other ascites: Secondary | ICD-10-CM | POA: Diagnosis not present

## 2015-06-10 DIAGNOSIS — G894 Chronic pain syndrome: Secondary | ICD-10-CM | POA: Diagnosis not present

## 2015-06-10 DIAGNOSIS — N730 Acute parametritis and pelvic cellulitis: Secondary | ICD-10-CM | POA: Diagnosis not present

## 2015-06-10 DIAGNOSIS — I959 Hypotension, unspecified: Secondary | ICD-10-CM | POA: Diagnosis not present

## 2015-06-10 DIAGNOSIS — F39 Unspecified mood [affective] disorder: Secondary | ICD-10-CM | POA: Diagnosis not present

## 2015-06-10 DIAGNOSIS — A5424 Gonococcal female pelvic inflammatory disease: Secondary | ICD-10-CM | POA: Diagnosis not present

## 2015-06-10 DIAGNOSIS — Z7901 Long term (current) use of anticoagulants: Secondary | ICD-10-CM | POA: Diagnosis not present

## 2015-06-10 DIAGNOSIS — R000 Tachycardia, unspecified: Secondary | ICD-10-CM | POA: Diagnosis not present

## 2015-06-10 DIAGNOSIS — R740 Nonspecific elevation of levels of transaminase and lactic acid dehydrogenase [LDH]: Secondary | ICD-10-CM | POA: Diagnosis not present

## 2015-06-10 DIAGNOSIS — G8918 Other acute postprocedural pain: Secondary | ICD-10-CM | POA: Diagnosis not present

## 2015-06-10 DIAGNOSIS — N760 Acute vaginitis: Secondary | ICD-10-CM | POA: Diagnosis not present

## 2015-06-10 DIAGNOSIS — I82593 Chronic embolism and thrombosis of other specified deep vein of lower extremity, bilateral: Secondary | ICD-10-CM | POA: Diagnosis not present

## 2015-06-10 DIAGNOSIS — R234 Changes in skin texture: Secondary | ICD-10-CM | POA: Diagnosis not present

## 2015-06-10 DIAGNOSIS — F603 Borderline personality disorder: Secondary | ICD-10-CM | POA: Diagnosis not present

## 2015-06-10 DIAGNOSIS — G629 Polyneuropathy, unspecified: Secondary | ICD-10-CM | POA: Diagnosis not present

## 2015-06-10 DIAGNOSIS — Z516 Encounter for desensitization to allergens: Secondary | ICD-10-CM | POA: Diagnosis not present

## 2015-06-10 DIAGNOSIS — Z881 Allergy status to other antibiotic agents status: Secondary | ICD-10-CM | POA: Diagnosis not present

## 2015-06-10 DIAGNOSIS — A419 Sepsis, unspecified organism: Secondary | ICD-10-CM | POA: Diagnosis not present

## 2015-06-11 DIAGNOSIS — I82513 Chronic embolism and thrombosis of femoral vein, bilateral: Secondary | ICD-10-CM | POA: Diagnosis not present

## 2015-06-11 DIAGNOSIS — Z1619 Resistance to other specified beta lactam antibiotics: Secondary | ICD-10-CM | POA: Diagnosis not present

## 2015-06-11 DIAGNOSIS — M797 Fibromyalgia: Secondary | ICD-10-CM | POA: Diagnosis not present

## 2015-06-11 DIAGNOSIS — Z516 Encounter for desensitization to allergens: Secondary | ICD-10-CM | POA: Diagnosis not present

## 2015-06-11 DIAGNOSIS — A5424 Gonococcal female pelvic inflammatory disease: Secondary | ICD-10-CM | POA: Diagnosis not present

## 2015-06-11 DIAGNOSIS — N760 Acute vaginitis: Secondary | ICD-10-CM | POA: Diagnosis not present

## 2015-06-11 DIAGNOSIS — R740 Nonspecific elevation of levels of transaminase and lactic acid dehydrogenase [LDH]: Secondary | ICD-10-CM | POA: Diagnosis not present

## 2015-06-11 DIAGNOSIS — K529 Noninfective gastroenteritis and colitis, unspecified: Secondary | ICD-10-CM | POA: Diagnosis not present

## 2015-06-11 DIAGNOSIS — K5100 Ulcerative (chronic) pancolitis without complications: Secondary | ICD-10-CM | POA: Diagnosis not present

## 2015-06-11 DIAGNOSIS — G8929 Other chronic pain: Secondary | ICD-10-CM | POA: Diagnosis not present

## 2015-06-11 DIAGNOSIS — I959 Hypotension, unspecified: Secondary | ICD-10-CM | POA: Diagnosis not present

## 2015-06-11 DIAGNOSIS — R1011 Right upper quadrant pain: Secondary | ICD-10-CM | POA: Diagnosis not present

## 2015-06-11 DIAGNOSIS — R188 Other ascites: Secondary | ICD-10-CM | POA: Diagnosis not present

## 2015-06-11 DIAGNOSIS — F603 Borderline personality disorder: Secondary | ICD-10-CM | POA: Diagnosis not present

## 2015-06-11 DIAGNOSIS — K210 Gastro-esophageal reflux disease with esophagitis: Secondary | ICD-10-CM | POA: Diagnosis not present

## 2015-06-11 DIAGNOSIS — F1210 Cannabis abuse, uncomplicated: Secondary | ICD-10-CM | POA: Diagnosis not present

## 2015-06-11 DIAGNOSIS — R9431 Abnormal electrocardiogram [ECG] [EKG]: Secondary | ICD-10-CM | POA: Diagnosis not present

## 2015-06-11 DIAGNOSIS — G629 Polyneuropathy, unspecified: Secondary | ICD-10-CM | POA: Diagnosis not present

## 2015-06-11 DIAGNOSIS — F4310 Post-traumatic stress disorder, unspecified: Secondary | ICD-10-CM | POA: Diagnosis not present

## 2015-06-11 DIAGNOSIS — R234 Changes in skin texture: Secondary | ICD-10-CM | POA: Diagnosis not present

## 2015-06-11 DIAGNOSIS — K659 Peritonitis, unspecified: Secondary | ICD-10-CM | POA: Diagnosis not present

## 2015-06-11 DIAGNOSIS — F329 Major depressive disorder, single episode, unspecified: Secondary | ICD-10-CM | POA: Diagnosis not present

## 2015-06-11 DIAGNOSIS — I82509 Chronic embolism and thrombosis of unspecified deep veins of unspecified lower extremity: Secondary | ICD-10-CM | POA: Diagnosis not present

## 2015-06-11 DIAGNOSIS — R112 Nausea with vomiting, unspecified: Secondary | ICD-10-CM | POA: Diagnosis not present

## 2015-06-11 DIAGNOSIS — Z881 Allergy status to other antibiotic agents status: Secondary | ICD-10-CM | POA: Diagnosis not present

## 2015-06-11 DIAGNOSIS — N179 Acute kidney failure, unspecified: Secondary | ICD-10-CM | POA: Diagnosis not present

## 2015-06-11 DIAGNOSIS — F39 Unspecified mood [affective] disorder: Secondary | ICD-10-CM | POA: Diagnosis not present

## 2015-06-11 DIAGNOSIS — Z9049 Acquired absence of other specified parts of digestive tract: Secondary | ICD-10-CM | POA: Diagnosis not present

## 2015-06-11 DIAGNOSIS — Z7901 Long term (current) use of anticoagulants: Secondary | ICD-10-CM | POA: Diagnosis not present

## 2015-06-11 DIAGNOSIS — F17210 Nicotine dependence, cigarettes, uncomplicated: Secondary | ICD-10-CM | POA: Diagnosis not present

## 2015-06-11 DIAGNOSIS — G2581 Restless legs syndrome: Secondary | ICD-10-CM | POA: Diagnosis not present

## 2015-06-11 DIAGNOSIS — A419 Sepsis, unspecified organism: Secondary | ICD-10-CM | POA: Diagnosis not present

## 2015-06-11 DIAGNOSIS — R079 Chest pain, unspecified: Secondary | ICD-10-CM | POA: Diagnosis not present

## 2015-06-11 DIAGNOSIS — N733 Female acute pelvic peritonitis: Secondary | ICD-10-CM | POA: Diagnosis not present

## 2015-06-11 DIAGNOSIS — N730 Acute parametritis and pelvic cellulitis: Secondary | ICD-10-CM | POA: Diagnosis not present

## 2015-06-11 DIAGNOSIS — G8918 Other acute postprocedural pain: Secondary | ICD-10-CM | POA: Diagnosis not present

## 2015-06-12 DIAGNOSIS — A419 Sepsis, unspecified organism: Secondary | ICD-10-CM | POA: Diagnosis not present

## 2015-06-12 DIAGNOSIS — A5424 Gonococcal female pelvic inflammatory disease: Secondary | ICD-10-CM | POA: Diagnosis not present

## 2015-06-12 DIAGNOSIS — K650 Generalized (acute) peritonitis: Secondary | ICD-10-CM | POA: Diagnosis not present

## 2015-06-12 DIAGNOSIS — M797 Fibromyalgia: Secondary | ICD-10-CM | POA: Diagnosis not present

## 2015-06-13 DIAGNOSIS — K650 Generalized (acute) peritonitis: Secondary | ICD-10-CM | POA: Diagnosis not present

## 2015-06-13 DIAGNOSIS — A419 Sepsis, unspecified organism: Secondary | ICD-10-CM | POA: Diagnosis not present

## 2015-06-13 DIAGNOSIS — M797 Fibromyalgia: Secondary | ICD-10-CM | POA: Diagnosis not present

## 2015-06-13 DIAGNOSIS — A5424 Gonococcal female pelvic inflammatory disease: Secondary | ICD-10-CM | POA: Diagnosis not present

## 2015-06-14 DIAGNOSIS — K650 Generalized (acute) peritonitis: Secondary | ICD-10-CM | POA: Diagnosis not present

## 2015-06-14 DIAGNOSIS — A5424 Gonococcal female pelvic inflammatory disease: Secondary | ICD-10-CM | POA: Diagnosis not present

## 2015-06-14 DIAGNOSIS — M797 Fibromyalgia: Secondary | ICD-10-CM | POA: Diagnosis not present

## 2015-06-14 DIAGNOSIS — A419 Sepsis, unspecified organism: Secondary | ICD-10-CM | POA: Diagnosis not present

## 2015-06-15 DIAGNOSIS — A419 Sepsis, unspecified organism: Secondary | ICD-10-CM | POA: Diagnosis not present

## 2015-06-15 DIAGNOSIS — M797 Fibromyalgia: Secondary | ICD-10-CM | POA: Diagnosis not present

## 2015-06-15 DIAGNOSIS — K650 Generalized (acute) peritonitis: Secondary | ICD-10-CM | POA: Diagnosis not present

## 2015-06-15 DIAGNOSIS — A5424 Gonococcal female pelvic inflammatory disease: Secondary | ICD-10-CM | POA: Diagnosis not present

## 2015-06-16 DIAGNOSIS — K91872 Postprocedural seroma of a digestive system organ or structure following a digestive system procedure: Secondary | ICD-10-CM | POA: Diagnosis not present

## 2015-06-16 DIAGNOSIS — N730 Acute parametritis and pelvic cellulitis: Secondary | ICD-10-CM | POA: Diagnosis not present

## 2015-06-16 DIAGNOSIS — R234 Changes in skin texture: Secondary | ICD-10-CM | POA: Diagnosis not present

## 2015-06-16 DIAGNOSIS — R109 Unspecified abdominal pain: Secondary | ICD-10-CM | POA: Diagnosis not present

## 2015-06-17 DIAGNOSIS — F4310 Post-traumatic stress disorder, unspecified: Secondary | ICD-10-CM | POA: Diagnosis not present

## 2015-06-17 DIAGNOSIS — A5424 Gonococcal female pelvic inflammatory disease: Secondary | ICD-10-CM | POA: Diagnosis not present

## 2015-06-17 DIAGNOSIS — G2581 Restless legs syndrome: Secondary | ICD-10-CM | POA: Diagnosis not present

## 2015-06-17 DIAGNOSIS — I871 Compression of vein: Secondary | ICD-10-CM | POA: Diagnosis not present

## 2015-06-17 DIAGNOSIS — M797 Fibromyalgia: Secondary | ICD-10-CM | POA: Diagnosis not present

## 2015-06-18 DIAGNOSIS — F39 Unspecified mood [affective] disorder: Secondary | ICD-10-CM | POA: Diagnosis not present

## 2015-06-18 DIAGNOSIS — F603 Borderline personality disorder: Secondary | ICD-10-CM | POA: Diagnosis not present

## 2015-06-18 DIAGNOSIS — A549 Gonococcal infection, unspecified: Secondary | ICD-10-CM | POA: Diagnosis not present

## 2015-06-18 DIAGNOSIS — Z7901 Long term (current) use of anticoagulants: Secondary | ICD-10-CM | POA: Diagnosis not present

## 2015-06-18 DIAGNOSIS — Z4800 Encounter for change or removal of nonsurgical wound dressing: Secondary | ICD-10-CM | POA: Diagnosis not present

## 2015-06-18 DIAGNOSIS — Z452 Encounter for adjustment and management of vascular access device: Secondary | ICD-10-CM | POA: Diagnosis not present

## 2015-06-18 DIAGNOSIS — N733 Female acute pelvic peritonitis: Secondary | ICD-10-CM | POA: Diagnosis not present

## 2015-06-18 DIAGNOSIS — I82502 Chronic embolism and thrombosis of unspecified deep veins of left lower extremity: Secondary | ICD-10-CM | POA: Diagnosis not present

## 2015-06-22 ENCOUNTER — Other Ambulatory Visit
Admission: RE | Admit: 2015-06-22 | Discharge: 2015-06-22 | Disposition: A | Discharge status: Home / Self Care | Payer: PPO | Source: Skilled Nursing Facility | Attending: Internal Medicine | Admitting: Internal Medicine

## 2015-06-22 DIAGNOSIS — N739 Female pelvic inflammatory disease, unspecified: Secondary | ICD-10-CM | POA: Diagnosis not present

## 2015-06-22 DIAGNOSIS — A549 Gonococcal infection, unspecified: Secondary | ICD-10-CM | POA: Insufficient documentation

## 2015-06-23 LAB — CBC WITH DIFFERENTIAL/PLATELET
BASOS ABS: 0.1 10*3/uL (ref 0–0.1)
Eosinophils Absolute: 0.1 10*3/uL (ref 0–0.7)
Eosinophils Relative: 1 %
HCT: 39.6 % (ref 35.0–47.0)
Hemoglobin: 13.3 g/dL (ref 12.0–16.0)
Lymphocytes Relative: 26 %
Lymphs Abs: 3.2 10*3/uL (ref 1.0–3.6)
MCH: 30 pg (ref 26.0–34.0)
MCHC: 33.5 g/dL (ref 32.0–36.0)
MCV: 89.7 fL (ref 80.0–100.0)
Monocytes Absolute: 0.4 10*3/uL (ref 0.2–0.9)
NEUTROS ABS: 8.7 10*3/uL — AB (ref 1.4–6.5)
PLATELETS: 492 10*3/uL — AB (ref 150–440)
RBC: 4.42 MIL/uL (ref 3.80–5.20)
RDW: 15.6 % — AB (ref 11.5–14.5)
WBC: 12.5 10*3/uL — ABNORMAL HIGH (ref 3.6–11.0)

## 2015-06-23 LAB — CREATININE, SERUM
Creatinine, Ser: 0.86 mg/dL (ref 0.44–1.00)
GFR calc non Af Amer: >60 mL/min (ref >60)

## 2015-06-23 LAB — BUN: BUN: 7 mg/dL (ref 6–20)

## 2015-06-24 DIAGNOSIS — R1011 Right upper quadrant pain: Secondary | ICD-10-CM | POA: Diagnosis not present

## 2015-06-29 DIAGNOSIS — I82509 Chronic embolism and thrombosis of unspecified deep veins of unspecified lower extremity: Secondary | ICD-10-CM | POA: Diagnosis not present

## 2015-06-29 DIAGNOSIS — Z7901 Long term (current) use of anticoagulants: Secondary | ICD-10-CM | POA: Diagnosis not present

## 2015-06-29 DIAGNOSIS — F17210 Nicotine dependence, cigarettes, uncomplicated: Secondary | ICD-10-CM | POA: Diagnosis not present

## 2015-06-29 DIAGNOSIS — I2699 Other pulmonary embolism without acute cor pulmonale: Secondary | ICD-10-CM | POA: Diagnosis not present

## 2015-06-29 DIAGNOSIS — D72829 Elevated white blood cell count, unspecified: Secondary | ICD-10-CM | POA: Diagnosis not present

## 2015-06-29 DIAGNOSIS — R102 Pelvic and perineal pain: Secondary | ICD-10-CM | POA: Diagnosis not present

## 2015-06-29 DIAGNOSIS — F4310 Post-traumatic stress disorder, unspecified: Secondary | ICD-10-CM | POA: Diagnosis not present

## 2015-06-29 DIAGNOSIS — M545 Low back pain: Secondary | ICD-10-CM | POA: Diagnosis not present

## 2015-06-29 DIAGNOSIS — N760 Acute vaginitis: Secondary | ICD-10-CM | POA: Diagnosis not present

## 2015-06-29 DIAGNOSIS — K429 Umbilical hernia without obstruction or gangrene: Secondary | ICD-10-CM | POA: Diagnosis not present

## 2015-06-29 DIAGNOSIS — Z79899 Other long term (current) drug therapy: Secondary | ICD-10-CM | POA: Diagnosis not present

## 2015-06-29 DIAGNOSIS — F3341 Major depressive disorder, recurrent, in partial remission: Secondary | ICD-10-CM | POA: Diagnosis not present

## 2015-06-29 DIAGNOSIS — F1210 Cannabis abuse, uncomplicated: Secondary | ICD-10-CM | POA: Diagnosis not present

## 2015-06-29 DIAGNOSIS — Z8619 Personal history of other infectious and parasitic diseases: Secondary | ICD-10-CM | POA: Diagnosis not present

## 2015-06-29 DIAGNOSIS — G609 Hereditary and idiopathic neuropathy, unspecified: Secondary | ICD-10-CM | POA: Diagnosis not present

## 2015-06-29 DIAGNOSIS — G4700 Insomnia, unspecified: Secondary | ICD-10-CM | POA: Diagnosis not present

## 2015-06-29 DIAGNOSIS — R188 Other ascites: Secondary | ICD-10-CM | POA: Diagnosis not present

## 2015-06-29 DIAGNOSIS — K210 Gastro-esophageal reflux disease with esophagitis: Secondary | ICD-10-CM | POA: Diagnosis not present

## 2015-06-29 DIAGNOSIS — F603 Borderline personality disorder: Secondary | ICD-10-CM | POA: Diagnosis not present

## 2015-06-29 DIAGNOSIS — R1011 Right upper quadrant pain: Secondary | ICD-10-CM | POA: Diagnosis not present

## 2015-06-29 DIAGNOSIS — J984 Other disorders of lung: Secondary | ICD-10-CM | POA: Diagnosis not present

## 2015-06-29 DIAGNOSIS — L03311 Cellulitis of abdominal wall: Secondary | ICD-10-CM | POA: Diagnosis not present

## 2015-06-29 DIAGNOSIS — M797 Fibromyalgia: Secondary | ICD-10-CM | POA: Diagnosis not present

## 2015-06-29 DIAGNOSIS — D649 Anemia, unspecified: Secondary | ICD-10-CM | POA: Diagnosis not present

## 2015-06-29 DIAGNOSIS — G8929 Other chronic pain: Secondary | ICD-10-CM | POA: Diagnosis not present

## 2015-06-29 DIAGNOSIS — G629 Polyneuropathy, unspecified: Secondary | ICD-10-CM | POA: Diagnosis not present

## 2015-06-29 DIAGNOSIS — F17200 Nicotine dependence, unspecified, uncomplicated: Secondary | ICD-10-CM | POA: Diagnosis not present

## 2015-06-30 DIAGNOSIS — T814XXA Infection following a procedure, initial encounter: Secondary | ICD-10-CM | POA: Diagnosis not present

## 2015-06-30 DIAGNOSIS — M797 Fibromyalgia: Secondary | ICD-10-CM | POA: Diagnosis not present

## 2015-06-30 DIAGNOSIS — K429 Umbilical hernia without obstruction or gangrene: Secondary | ICD-10-CM | POA: Diagnosis not present

## 2015-06-30 DIAGNOSIS — L03311 Cellulitis of abdominal wall: Secondary | ICD-10-CM | POA: Diagnosis not present

## 2015-06-30 DIAGNOSIS — I82509 Chronic embolism and thrombosis of unspecified deep veins of unspecified lower extremity: Secondary | ICD-10-CM | POA: Diagnosis not present

## 2015-06-30 DIAGNOSIS — F4310 Post-traumatic stress disorder, unspecified: Secondary | ICD-10-CM | POA: Diagnosis not present

## 2015-06-30 DIAGNOSIS — R109 Unspecified abdominal pain: Secondary | ICD-10-CM | POA: Diagnosis not present

## 2015-07-01 DIAGNOSIS — L03311 Cellulitis of abdominal wall: Secondary | ICD-10-CM | POA: Diagnosis not present

## 2015-07-01 DIAGNOSIS — M797 Fibromyalgia: Secondary | ICD-10-CM | POA: Diagnosis not present

## 2015-07-01 DIAGNOSIS — F4310 Post-traumatic stress disorder, unspecified: Secondary | ICD-10-CM | POA: Diagnosis not present

## 2015-07-01 DIAGNOSIS — I82509 Chronic embolism and thrombosis of unspecified deep veins of unspecified lower extremity: Secondary | ICD-10-CM | POA: Diagnosis not present

## 2015-07-16 DIAGNOSIS — F39 Unspecified mood [affective] disorder: Secondary | ICD-10-CM | POA: Diagnosis not present

## 2015-07-16 DIAGNOSIS — N733 Female acute pelvic peritonitis: Secondary | ICD-10-CM | POA: Diagnosis not present

## 2015-07-16 DIAGNOSIS — Z7901 Long term (current) use of anticoagulants: Secondary | ICD-10-CM | POA: Diagnosis not present

## 2015-07-16 DIAGNOSIS — A549 Gonococcal infection, unspecified: Secondary | ICD-10-CM | POA: Diagnosis not present

## 2015-07-16 DIAGNOSIS — Z452 Encounter for adjustment and management of vascular access device: Secondary | ICD-10-CM | POA: Diagnosis not present

## 2015-07-16 DIAGNOSIS — F603 Borderline personality disorder: Secondary | ICD-10-CM | POA: Diagnosis not present

## 2015-07-16 DIAGNOSIS — I82502 Chronic embolism and thrombosis of unspecified deep veins of left lower extremity: Secondary | ICD-10-CM | POA: Diagnosis not present

## 2015-07-16 DIAGNOSIS — Z4800 Encounter for change or removal of nonsurgical wound dressing: Secondary | ICD-10-CM | POA: Diagnosis not present

## 2015-08-01 DIAGNOSIS — I82502 Chronic embolism and thrombosis of unspecified deep veins of left lower extremity: Secondary | ICD-10-CM | POA: Diagnosis not present

## 2015-08-01 DIAGNOSIS — Z452 Encounter for adjustment and management of vascular access device: Secondary | ICD-10-CM | POA: Diagnosis not present

## 2015-08-01 DIAGNOSIS — F603 Borderline personality disorder: Secondary | ICD-10-CM | POA: Diagnosis not present

## 2015-08-01 DIAGNOSIS — N733 Female acute pelvic peritonitis: Secondary | ICD-10-CM | POA: Diagnosis not present

## 2015-08-01 DIAGNOSIS — Z7901 Long term (current) use of anticoagulants: Secondary | ICD-10-CM | POA: Diagnosis not present

## 2015-08-01 DIAGNOSIS — F39 Unspecified mood [affective] disorder: Secondary | ICD-10-CM | POA: Diagnosis not present

## 2015-08-01 DIAGNOSIS — Z4800 Encounter for change or removal of nonsurgical wound dressing: Secondary | ICD-10-CM | POA: Diagnosis not present

## 2015-08-01 DIAGNOSIS — A549 Gonococcal infection, unspecified: Secondary | ICD-10-CM | POA: Diagnosis not present

## 2015-08-11 DIAGNOSIS — R1011 Right upper quadrant pain: Secondary | ICD-10-CM | POA: Diagnosis not present

## 2015-08-11 DIAGNOSIS — G8929 Other chronic pain: Secondary | ICD-10-CM | POA: Diagnosis not present

## 2015-08-11 DIAGNOSIS — Z23 Encounter for immunization: Secondary | ICD-10-CM | POA: Diagnosis not present

## 2015-08-11 DIAGNOSIS — Z720 Tobacco use: Secondary | ICD-10-CM | POA: Diagnosis not present

## 2015-08-11 DIAGNOSIS — Z13220 Encounter for screening for lipoid disorders: Secondary | ICD-10-CM | POA: Diagnosis not present

## 2015-08-11 DIAGNOSIS — I871 Compression of vein: Secondary | ICD-10-CM | POA: Diagnosis not present

## 2015-08-11 DIAGNOSIS — F419 Anxiety disorder, unspecified: Secondary | ICD-10-CM | POA: Diagnosis not present

## 2015-08-11 DIAGNOSIS — J4540 Moderate persistent asthma, uncomplicated: Secondary | ICD-10-CM | POA: Diagnosis not present

## 2015-08-11 DIAGNOSIS — R319 Hematuria, unspecified: Secondary | ICD-10-CM | POA: Diagnosis not present

## 2015-08-11 DIAGNOSIS — F332 Major depressive disorder, recurrent severe without psychotic features: Secondary | ICD-10-CM | POA: Diagnosis not present

## 2015-08-11 DIAGNOSIS — Z1239 Encounter for other screening for malignant neoplasm of breast: Secondary | ICD-10-CM | POA: Diagnosis not present

## 2015-08-11 DIAGNOSIS — M5442 Lumbago with sciatica, left side: Secondary | ICD-10-CM | POA: Diagnosis not present

## 2015-08-11 DIAGNOSIS — Z79899 Other long term (current) drug therapy: Secondary | ICD-10-CM | POA: Diagnosis not present

## 2015-08-13 DIAGNOSIS — R319 Hematuria, unspecified: Secondary | ICD-10-CM | POA: Diagnosis not present

## 2015-09-28 DIAGNOSIS — I871 Compression of vein: Secondary | ICD-10-CM | POA: Diagnosis not present

## 2015-10-06 ENCOUNTER — Other Ambulatory Visit: Payer: Self-pay | Admitting: Family Medicine

## 2015-10-06 DIAGNOSIS — Z1231 Encounter for screening mammogram for malignant neoplasm of breast: Secondary | ICD-10-CM

## 2015-10-12 ENCOUNTER — Ambulatory Visit: Admission: RE | Admit: 2015-10-12 | Payer: PPO | Source: Ambulatory Visit

## 2015-10-18 DIAGNOSIS — M79604 Pain in right leg: Secondary | ICD-10-CM | POA: Diagnosis not present

## 2015-10-18 DIAGNOSIS — K0889 Other specified disorders of teeth and supporting structures: Secondary | ICD-10-CM | POA: Diagnosis not present

## 2015-10-18 DIAGNOSIS — G8929 Other chronic pain: Secondary | ICD-10-CM | POA: Diagnosis not present

## 2015-10-18 DIAGNOSIS — M5442 Lumbago with sciatica, left side: Secondary | ICD-10-CM | POA: Diagnosis not present

## 2015-10-18 DIAGNOSIS — K644 Residual hemorrhoidal skin tags: Secondary | ICD-10-CM | POA: Diagnosis not present

## 2015-10-18 DIAGNOSIS — Z972 Presence of dental prosthetic device (complete) (partial): Secondary | ICD-10-CM | POA: Diagnosis not present

## 2015-10-20 ENCOUNTER — Other Ambulatory Visit: Payer: Self-pay | Admitting: *Deleted

## 2015-10-20 NOTE — Patient Outreach (Addendum)
[facility]) Care Management  10/20/2015  Roberts GaudyWendy M Pines 09/14/1975 [id number]   Subjective: Telephone call to patient's home number, message states you have reached the [employer] national payment system, call disconnected, and unable to leave a message.  Objective: Per chart review:  Patient has not had any recent inpatient hospitalization.   Patient had ED visit on 02/22/15 for sore throat and strep throat.   Also has ED visit on 02/09/15 for altered mental status.   Assessment : HTA Self Referral received on 10/07/15.    Patient has May Thurner Syndrome and other medical issues.  Has a MidwifeHealthcare Power of Attorney  and Kelly Servicesdvanced Directives in place.    Telephone screen, pending patient contact.    Plan: RNCM will attempt to locate additional contact numbers for patient via primary MD's office or patient's emergency contacts, within 10 business days. RNCM will call patient for 2nd telephone outreach attempt,  telephone screen, within 10 business days, if no return call.   Jasalyn Frysinger H. Gardiner Barefootooper RN, BSN, CCM [facility] Care [facility] Telephonic CM Phone: [phone] Fax: [phone]

## 2015-10-21 ENCOUNTER — Other Ambulatory Visit: Payer: PPO | Admitting: *Deleted

## 2015-10-21 DIAGNOSIS — F419 Anxiety disorder, unspecified: Secondary | ICD-10-CM | POA: Diagnosis not present

## 2015-10-21 DIAGNOSIS — F331 Major depressive disorder, recurrent, moderate: Secondary | ICD-10-CM | POA: Diagnosis not present

## 2015-10-21 NOTE — Patient Outreach (Addendum)
[facility]) Care Management  10/21/2015  Roberts GaudyWendy M Townshend 04/09/1975 [id number]   Subjective: Telephone call to patient's home number, message states you have reached the [employer] national payment system, call disconnected, and unable to leave a message. Telephone call to patient's emergency contact, daughter Toribio HarbourRaja Mims ( [phone]) home number, no answer, left HIPAA compliant message for patient, and request call back.  Telephone call to patient's primary MD's office (Dr. Pollyann SavoySheets), spoke with Marcelino DusterMichelle, and advised to call clinic [phone]) to reach provider.  Telephone call to patient's primary MD's clinic office times 3, no answer, message states you have reached [facility], and phone call disconnected automatically, unable to reach attendant.   Objective: Per chart review:  Patient has not had any recent inpatient hospitalization.   Patient had ED visit on 02/22/15 for sore throat and strep throat.   Also has ED visit on 02/09/15 for altered mental status.   Assessment : HTA Self Referral received on 10/07/15.    Patient has May Thurner Syndrome and other medical issues.  Has a MidwifeHealthcare Power of Attorney  and Kelly Servicesdvanced Directives in place.    Telephone screen, pending patient contact.    Plan: RNCM will call patient for 3rd telephone outreach attempt, will attempt to reach patient through emergency contact number,  To complete telephone screen, within 10 business days, if no return call.   Phill Steck H. Gardiner Barefootooper RN, BSN, CCM [facility] Care Management [facility] Telephonic CM Phone: [phone] Fax: [phone]

## 2015-10-22 ENCOUNTER — Other Ambulatory Visit: Payer: Self-pay | Admitting: *Deleted

## 2015-10-22 NOTE — Patient Outreach (Addendum)
[facility]) Care Management  10/22/2015  Lindsey Willis [birth year] [id number]   Subjective: Case discussed with Lindsey Willis at [facility] Care Management Assistant Director.  Per referral patient's primary provider is Lindsey Willis. Telephone call to patient's provider Lindsey Willis Nurse Willis) office, spoke with Lindsey Willis, and HIPAA verified.   Verified patient's primary provider is Lindsey Willis and received updated telephone number [phone]).   RNCM advised Lindsey Willis of RNCM's outreach attempts to patient and next steps.  Lindsey Willis states she will give update to provider and RNCM's Willis information if she has any questions.  Telephone call to patient's home number per provider's office, spoke with patient and HIPAA verified.  Discussed [facility] Care Management services and patient voiced understanding.   States she is doing ok and is in agreement to receive [facility] Care Management services.  Patient gave verbal authorization to speak with daugter Lindsey Willis) and son Lindsey Willis) regarding her healthcare needs as needed. Patient states her power of attorney is her son Lindsey Willis).   States she would like the following changes made to her chart: remove daughter Lindsey Willis and add daughter Lindsey Willis) with her Willis number [phone]).  Update patient's home number from [phone] to  [phone].   Update patient's primary provider from Dr. Starr Willis to Lindsey Willis.    Patient states she has May-Thurner syndrome (MTS), possible asthma, fibromyalgia, major depression, borderline personality disorder, and some type of heart valve issue (unsure of the name).  States her most recent hospitalization was in May of 2017 at [facility] for peritonitis.  States she was diagnosed with May-Thurner syndrome (MTS), in December of 2010.   Has stent in bilateral legs and IV filter in  heart placed in 2011.  Stent in  left leg is currently clotted and has a follow up appointment on 11/02/15 to discuss treatment options.   Patient states she has balance issues, considered about falling, and is unable to run due to May-Thurner syndrome (MTS).  Her hospitalization of leg issues was  February 2017.    Patient states she saw her therapist on 10/21/15 for depression follow up, and is planning to follow up on the treatment recommendation (to follow with provider for chronic pain management). States she has a very high pain threshold and rating of 6 is her baseline pain tolerance.   Patient in agreement to [facility] Care Management [facility] referral for home safety evaluation, possible assistance with obtaining shower chair care coordination (not covered by insurance, and care coordination of pending treatment options for May-Thurner syndrome (MTS) and clotted leg stent.    Objective:Per chart review: Patient has not had any recent inpatient hospitalization. Patient had ED visit on 02/22/15 for sore throat and strep throat. Also has ED visit on 02/09/15 for altered mental status.   Assessment : HTA Self Referral received on 10/07/15. Patient has May Thurner Syndrome and other medical issues. Has a Midwife and Kelly Services in place. Telephone screen completed and patient will be referred to [facility] Care Management [facility] for follow up.   No Telephonic RNCM needs at this time.    Plan: RNCM will refer patient to [facility] Care Management Community [facility] referral for home safety evaluation, possible assistance with obtaining shower chair care coordination (not covered by insurance, and care coordination of pending treatment options for May-Thurner syndrome (MTS) and clotted leg stent.   RNCM will send request to Lindsey Willis at  [facility]  Care Management to update the following information in patient's chart: remove daughter Lindsey Willis as emergency Willis and add daughter Lindsey Willis(Lindsey Willis) with her  Willis number [phone]).  Update patient's home number from [phone] to  [phone].   Update patient's primary provider from Dr. Starr SinclairJulia Willis to [facility] Nurse Willis.     Lindsey Willis H. Lindsey Barefootooper RN, BSN, CCM [facility] Care Management [facility] Telephonic CM Phone: [phone] Fax: [phone]

## 2015-10-26 ENCOUNTER — Ambulatory Visit
Admission: EM | Admit: 2015-10-26 | Discharge: 2015-10-26 | Disposition: A | Discharge status: Home / Self Care | Payer: PPO | Attending: Family Medicine | Admitting: Family Medicine

## 2015-10-26 ENCOUNTER — Encounter: Payer: Self-pay | Admitting: *Deleted

## 2015-10-26 DIAGNOSIS — K0889 Other specified disorders of teeth and supporting structures: Secondary | ICD-10-CM | POA: Diagnosis not present

## 2015-10-26 MED ORDER — PROMETHAZINE HCL 25 MG PO TABS: 25.0000 mg | ORAL_TABLET | Freq: Four times a day (QID) | ORAL | 0 refills | Status: DC | PRN

## 2015-10-26 NOTE — ED Provider Notes (Signed)
CSN: [id number]     Arrival date & time 10/26/15  1044 History   First MD Initiated Contact with Patient 10/26/15 1114     Chief Complaint  Patient presents with  . Dental Pain   (Consider location/radiation/quality/duration/timing/severity/associated sxs/prior Treatment) HPI   This is a 40 year old female who presents with dental pain. States that she had 6 teeth pulled yesterday was given Norco by her dentist but has made her very nauseated and she been vomiting them up. In review of her medical records she is allergic to almost all narcotic medications. She also tells me that she cannot take Toradol because she is currently on xeralto, and she does not tolerate Zofran. She states that the only medication that she can take for nausea as Phenergan. Unfortunately she drove today and I am unable to provide her with injectable Phenergan. She is also has a long history of narcotic abuse that is documented in her records.       Past Medical History:  Diagnosis Date  . Anemia   . Asthma   . Borderline personality disorder   . Depression   . DVT of leg (deep venous thrombosis) (HCC)   . Fibromyalgia   . May-Thurner syndrome   . PTSD (post-traumatic stress disorder)    Past Surgical History:  Procedure Laterality Date  . CHOLECYSTECTOMY    . IVC FILTER PLACEMENT ([facility] HX)    . LEG SURGERY    . TUBAL LIGATION     Family History  Problem Relation Age of Onset  . Schizophrenia Mother   . Drug abuse Mother   . Heart defect Father   . Heart Problems Father   . Drug abuse Father   . Bipolar disorder Sister   . Bipolar disorder Maternal Uncle   . Schizophrenia Maternal Uncle    Social History  Substance Use Topics  . Smoking status: Current Every Day Smoker    Packs/day: 1.00    Types: Cigarettes    Start date: 04/07/1988  . Smokeless tobacco: Never Used  . Alcohol use No   OB History    Gravida Para Term Preterm AB Living   3 3 2 1  0 3   SAB TAB Ectopic Multiple Live Births    0 0 0 0       Review of Systems  Constitutional: Positive for activity change and appetite change. Negative for chills, fatigue and fever.  HENT: Positive for dental problem.   All other systems reviewed and are negative.   Allergies  Amitriptyline; Hydrocodone; Levofloxacin; Vicodin [hydrocodone-acetaminophen]; Iodine; Keflex [cephalexin]; Levaquin [levofloxacin in d5w]; and Percocet [oxycodone-acetaminophen]  Home Medications   Prior to Admission medications   Medication Sig Start Date End Date Taking? Authorizing Provider  albuterol (PROVENTIL HFA;VENTOLIN HFA) 108 (90 Base) MCG/ACT inhaler Inhale 1-2 puffs into the lungs every 6 (six) hours as needed for wheezing or shortness of breath (Patient does not use but is active prescription).   Yes Historical Provider, MD  beclomethasone (QVAR) 40 MCG/ACT inhaler Inhale 2 puffs into the lungs 2 (two) times daily.   Yes Historical Provider, MD  clonazePAM (KLONOPIN) 0.5 MG tablet Take 0.5 mg by mouth 2 (two) times daily.   Yes Historical Provider, MD  DULoxetine (CYMBALTA) 60 MG capsule Take 1 capsule (60 mg total) by mouth 2 (two) times daily. 04/08/15  Yes Himabindu Ravi, MD  gabapentin (NEURONTIN) 800 MG tablet Take 800 mg by mouth 4 (four) times daily.   Yes Historical Provider, MD  mirtazapine (  REMERON) 30 MG tablet Take 1 tablet (30 mg total) by mouth at bedtime. 04/08/15  Yes Himabindu Ravi, MD  rivaroxaban (XARELTO) 20 MG TABS tablet Take 20 mg by mouth daily.   Yes Historical Provider, MD  rOPINIRole (REQUIP) 1 MG tablet Take 1 mg by mouth at bedtime.   Yes Historical Provider, MD  tiotropium (SPIRIVA) 18 MCG inhalation capsule Place 18 mcg into inhaler and inhale daily. Active prescription, patient just doesn't use   Yes Historical Provider, MD  clopidogrel (PLAVIX) 75 MG tablet Take 75 mg by mouth daily.    Historical Provider, MD  doxycycline (VIBRA-TABS) 100 MG tablet Take 1 tablet (100 mg total) by mouth 2 (two) times daily.  02/22/15   Delorise Royals Cuthriell, PA-C  magic mouthwash w/lidocaine SOLN Take 5 mLs by mouth 4 (four) times daily. 02/22/15   Delorise Royals Cuthriell, PA-C  promethazine (PHENERGAN) 25 MG tablet Take 1 tablet (25 mg total) by mouth every 6 (six) hours as needed for nausea or vomiting. 10/26/15   Lutricia Feil, PA-C   Meds Ordered and Administered this Visit  Medications - No data to display  BP 140/84 (BP Location: Left Arm)   Pulse 80   Temp 98 F (36.7 C) (Tympanic)   Resp 16   Ht 5\' 3"  (1.6 m)   Wt 168 lb (76.2 kg)   LMP 10/11/2015 (Approximate)   SpO2 99%   BMI 29.76 kg/m  No data found.   Physical Exam  Constitutional: She is oriented to person, place, and time. She appears well-developed and well-nourished. No distress.  HENT:  Head: Normocephalic and atraumatic.  Examination of her mouth shows her to be edentulous. She has had sutures on the gumline from the extractions. There is no drainage seen from any of the areas.  Eyes: EOM are normal. Pupils are equal, round, and reactive to light.  Neck: Normal range of motion. Neck supple.  Musculoskeletal: Normal range of motion. She exhibits no edema, tenderness or deformity.  Neurological: She is alert and oriented to person, place, and time.  Skin: Skin is warm and dry. She is not diaphoretic.  Psychiatric: She has a normal mood and affect. Her behavior is normal. Judgment and thought content normal.  Nursing note and vitals reviewed.   Urgent Care Course   Clinical Course    Procedures (including critical care time)  Labs Review Labs Reviewed - No data to display  Imaging Review No results found.   Visual Acuity Review  Right Eye Distance:   Left Eye Distance:   Bilateral Distance:    Right Eye Near:   Left Eye Near:    Bilateral Near:         MDM   1. Pain, dental    I have discussed with the patient her dental pain in her need to return to the dentist who extracted her teeth. I told her I can help  her with her nausea and vomiting but her pain will need to be addressed by the person who performed the extractions. Since she is driving I cannot give her injectable Phenergan but I will give her a limited supply that she can take orally in hopes that she will be able to tolerate the pain medication prescribed for her. She is going to leave our office and proceed to her dentist in [place] since she has not heard from him from her initial phone call this morning. If she continues to have difficulties she should go to the  emergency department where they have a larger formulary to choose from.    Lutricia Feil, PA-C 10/26/15 1143

## 2015-10-26 NOTE — ED Triage Notes (Signed)
Pt had 6 teeth extracted yesterday and was rx Norco for post-op pain. Pt is vomiting medication and now in 10/10 pain.

## 2015-10-28 ENCOUNTER — Other Ambulatory Visit: Payer: Self-pay | Admitting: *Deleted

## 2015-10-28 NOTE — Patient Outreach (Signed)
[facility]) Care Management  10/28/2015  Roberts GaudyWendy M Curb 12/02/1975 [id number]   Referral received from telephonic health coach, placed initial Community Care Coordinator call to updated telephone number provided,  unsuccessful call and unable to leave a message.  Plan Will await return call, if no response will attempt call in the next business day.   Egbert GaribaldiKimberly Glover, RN, [facility] Care Management [phone]- Mobile [phone]- Toll Free Main Office

## 2015-10-29 ENCOUNTER — Other Ambulatory Visit: Payer: Self-pay | Admitting: *Deleted

## 2015-10-29 NOTE — Patient Outreach (Signed)
[facility]) Care Management  10/29/2015  [name] 10/23/1975 [id number]   Placed call to Lindsey Willis to updated telephone number listed, no answer, message states "customer unable to take calls at this time"  Plan Will place 3rd telephone attempt in the next 7 days.    Egbert GaribaldiKimberly Ronda Rajkumar, RN, [facility] Care Management [phone] Mobile [phone]- Toll Free Main Office

## 2015-11-02 DIAGNOSIS — I871 Compression of vein: Secondary | ICD-10-CM | POA: Diagnosis not present

## 2015-11-08 ENCOUNTER — Encounter: Payer: Self-pay | Admitting: *Deleted

## 2015-11-08 ENCOUNTER — Other Ambulatory Visit: Payer: Self-pay | Admitting: *Deleted

## 2015-11-08 NOTE — Patient Outreach (Addendum)
[facility](THN) Care Management  11/08/2015  Lindsey GaudyWendy M Willis 09/12/1975 [id number]   Successful telephone call to Lindsey Willis, Hippa questions answered correctly. Explained reason for call , verified understanding. Lindsey Willis discussed her main concern now is being able to get a shower chair, and how this would help her in daily chair, she discussed that it is difficult to stand for long periods of time due to her chronic leg pain. Lindsey Willis discussed her daily pain level is a 6 and she is able to manage most days with this.  Patient discussed that she has medicare and medicaid at this time and unsure about the process of obtaining a shower chair.   Lindsey Willis is agreeable to a how visit for assessment of other needs.  Patient reports that she is able to drive herself to appointments, she discussed that she her Medical doctor and [facility] and [facility].   Lindsey Willis states she continues to smoke and declines further education, states she has done everything else the doctors told her to do, smoking is the one thing she will continue to do, and she verbalizes the effects of smoking on her current condition.   Patient discussed her weight loss since her surgery in May, at least 30 lbs, but reports she is eating well and tries to eat a balanced diet avoiding fried foods.   Plan Will plan home visit on next week for home safety evaluation and assess for further care management needs.  Will send barrier letter to PCP Will place social worker consult regarding assistance with obtaining a shower chair.    [facility] CM Care Plan Problem One   Flowsheet Row Most Recent Value  Care Plan Problem One  Knowledge  deficit of self care managment needs  evidenced by request of assistance with obtaiing needed equiipment   Role Documenting the Problem One  Care Management Coordinator  Care Plan for Problem One  Active  THN Long Term Goal (31-90 days)  Patient will  report increased knowledge of chronic disease safety  self care managment in the next 31 day   THN Long Term Goal Start Date  11/08/15  Interventions for Problem One Long Term Goal  Discussed importance of attending all medical appointments and notifying MD of worsening symptoms   THN CM Short Term Goal #1 (0-30 days)  Patient will report being able to obtain a shower chair in the next 30 days   THN CM Short Term Goal #1 Start Date  11/08/15  Interventions for Short Term Goal #1  Identiifed patient reported need for shower chair, will contact PCP if order needed   THN CM Short Term Goal #2 (0-30 days)  Patient will be able to report at least 2 fall prevention strategies in the next 30 days   THN CM Short Term Goal #2 Start Date  11/08/15  Interventions for Short Term Goal #2  Discussed importance of consistent use of cane to help with balance , will schedule home visit for further safety evaluation      Egbert GaribaldiKimberly Domanique Huesman, RN, [facility] Care Management [phone] Mobile [phone]- Toll Free Main Office .

## 2015-11-08 NOTE — Addendum Note (Signed)
Addended by: Egbert GaribaldiGLOVER, Aarushi Hemric A on: 11/08/2015 12:09 PM   Modules accepted: Orders

## 2015-11-11 ENCOUNTER — Ambulatory Visit
Admission: RE | Admit: 2015-11-11 | Discharge: 2015-11-11 | Disposition: A | Discharge status: Home / Self Care | Payer: PPO | Source: Ambulatory Visit | Attending: Family Medicine | Admitting: Family Medicine

## 2015-11-11 DIAGNOSIS — Z1231 Encounter for screening mammogram for malignant neoplasm of breast: Secondary | ICD-10-CM | POA: Diagnosis not present

## 2015-11-15 ENCOUNTER — Other Ambulatory Visit: Payer: Self-pay | Admitting: *Deleted

## 2015-11-15 NOTE — Patient Outreach (Signed)
[facility](THN) Care Management  11/15/2015  Roberts GaudyWendy M Gazzola 03/14/1975 [id number]   [facility] Ander PurpuraKim Glover to assist patient with a shower chair.  Per patient, she has chronic leg pain and it is difficult for her to stand for long periods of time.  Patient receives Medicare/Medicaid.  She resides with her daughter and 2 grandchildren in an apartment.  Patient also receives food stamps.  Patient states that the shower chair is the only need she has at this time.  Financial form completed, patient eligible for assistance through the [facility] giving committee fund for a shower chair.   Plan:  Home visit scheduled for 11/25/15 to provide patient with needed DME due to chronic pain and patient's inability to stand for long periods of time.  Shower chair to be provided to assist in avoiding falls.    Adriana ReamsChrystal Myron Lona, LCSW [facility] Care Management [phone]

## 2015-11-16 ENCOUNTER — Other Ambulatory Visit: Payer: Self-pay | Admitting: *Deleted

## 2015-11-16 ENCOUNTER — Encounter: Payer: Self-pay | Admitting: *Deleted

## 2015-11-16 NOTE — Patient Outreach (Addendum)
[facility](THN) Care Management   11/16/2015  Roberts GaudyWendy M Raske 11/28/1975 [id number]  Roberts GaudyWendy M Schwimmer is an 40 y.o. female  Subjective:  Patient discussed her history of stents to left leg, blood clot problem and May-Thurner syndrome. Patient discussed she had a recent CT and awaiting follow up appointment with vascular MD regarding plan for further treatment.  Patient has discussed upcoming visit with hematology regarding evaluation for possible change in antiplatelet.  Toniann FailWendy denies having a recent fall,does not use her cane in home , she keeps it in the car for when she drives out. Patient her difficulty with standing to take a shower, due to discomfort in back and left leg.   Patient has all medication available and denies problems with cost. Patient reports consistent follow up with all of her [facility]     Objective:  BP 112/72 (BP Location: Left Arm, Patient Position: Sitting)   Pulse 77   Resp 18   LMP 10/09/2015   SpO2 98%  Review of Systems  Constitutional: Negative.   HENT: Negative.   Eyes: Negative.   Respiratory: Negative.   Cardiovascular: Negative.   Gastrointestinal: Negative.   Genitourinary: Negative.   Musculoskeletal: Positive for back pain.  Skin: Negative.   Neurological: Negative.   Endo/Heme/Allergies: Negative.   Psychiatric/Behavioral: Positive for depression. Negative for suicidal ideas.    Physical Exam  Constitutional: She is oriented to person, place, and time. She appears well-developed and well-nourished.  Cardiovascular: Normal rate and normal heart sounds.   Respiratory: Effort normal.  GI: Soft.  Musculoskeletal:  Complaint of left leg weaker history   Neurological: She is alert and oriented to person, place, and time.  Skin: Skin is warm and dry.  Psychiatric: She has a normal mood and affect. Her behavior is normal. Judgment and thought content normal.    Encounter Medications:   Outpatient  Encounter Prescriptions as of 11/16/2015  Medication Sig Note  . albuterol (PROVENTIL HFA;VENTOLIN HFA) 108 (90 Base) MCG/ACT inhaler Inhale 1-2 puffs into the lungs every 6 (six) hours as needed for wheezing or shortness of breath (Patient does not use but is active prescription).   . beclomethasone (QVAR) 40 MCG/ACT inhaler Inhale 2 puffs into the lungs 2 (two) times daily.   . clonazePAM (KLONOPIN) 0.5 MG tablet Take 0.5 mg by mouth 2 (two) times daily.   . DULoxetine (CYMBALTA) 60 MG capsule Take 1 capsule (60 mg total) by mouth 2 (two) times daily.   Marland Kitchen. gabapentin (NEURONTIN) 800 MG tablet Take 800 mg by mouth 4 (four) times daily.   . mirtazapine (REMERON) 30 MG tablet Take 1 tablet (30 mg total) by mouth at bedtime.   . rivaroxaban (XARELTO) 20 MG TABS tablet Take 20 mg by mouth daily.   Marland Kitchen. rOPINIRole (REQUIP) 1 MG tablet Take 1 mg by mouth at bedtime.   Marland Kitchen. tiotropium (SPIRIVA) 18 MCG inhalation capsule Place 18 mcg into inhaler and inhale daily. Active prescription, patient just doesn't use   . clopidogrel (PLAVIX) 75 MG tablet Take 75 mg by mouth daily. 11/16/2015: Not taking  . doxycycline (VIBRA-TABS) 100 MG tablet Take 1 tablet (100 mg total) by mouth 2 (two) times daily. (Patient not taking: Reported on 11/16/2015) 11/16/2015: Not taking  . magic mouthwash w/lidocaine SOLN Take 5 mLs by mouth 4 (four) times daily. (Patient not taking: Reported on 11/16/2015) 11/16/2015: Not taking  . promethazine (PHENERGAN) 25 MG tablet Take 1 tablet (25 mg total) by mouth  every 6 (six) hours as needed for nausea or vomiting. (Patient not taking: Reported on 11/16/2015) 11/16/2015: Not taking   No facility-administered encounter medications on file as of 11/16/2015.     Functional Status:   In your present state of health, do you have any difficulty performing the following activities: 11/08/2015  Hearing? N  Vision? N  Difficulty concentrating or making decisions? N  Walking or climbing stairs? Y   Dressing or bathing? N  Doing errands, shopping? N  Preparing Food and eating ? N  Using the Toilet? N  In the past six months, have you accidently leaked urine? N  Do you have problems with loss of bowel control? N  Managing your Medications? N  Managing your Finances? N  Housekeeping or managing your Housekeeping? N  Some recent data might be hidden    Fall/Depression Screening:    PHQ 2/9 Scores 11/16/2015 10/22/2015  PHQ - 2 Score 4 4  PHQ- 9 Score 9 11   Fall Risk  11/08/2015  Falls in the past year? No  Risk for fall due to : Impaired balance/gait  Risk for fall due to (comments): fall pevention discussed   Assessment:  Initial home visit. Patient discussed her biggest concern are her chronic pain and continued treatment related to May-Thurner syndrome and being able to get a shower chair to make it easier and safer to shower.   Chronic Pain, May-Thurner-  Consistent follow up with MD Will call Vascular  MD for appointment if hasn't heard from office in the next 2 weeks .   Asthma - stable, uses Albuterol inhaler about monthly- continues to smokes, declines being ready to quit. Plans to get Flu shot in November at PCP office.   Fall Risk/Home Safety evaluation - High Fall Risk,   Benefit from shower chair help with ease of showering and prevent fall,  has a regula/standard size  tub in home .  Reinforcement of using her cane, and keeping a clear walking pathway in home .   Depression - Positive for depression , reports consistent follow up with psychiatrist at [facility].     Plan:  Care planning and goal setting reviewed. Will plan telephone follow up in the next 3 weeks.   [facility] CM Care Plan Problem One   Flowsheet Row Most Recent Value  Care Plan Problem One  Knowledge  deficit of self care managment needs  evidenced by request of assistance with obtaiing needed equiipment   Role Documenting the Problem One  Care Management Coordinator  Care Plan for Problem One  Active   THN Long Term Goal (31-90 days)  Patient will report increased knowledge of chronic disease safety  self care managment in the next 31 day   THN Long Term Goal Start Date  11/08/15  Interventions for Problem One Long Term Goal  Reinforced attending all appointments and notifying MD of worsening of symptoms of pain, numbness or color change in extremity.  THN CM Short Term Goal #1 (0-30 days)  Patient will report being able to obtain a shower chair in the next 30 days   THN CM Short Term Goal #1 Start Date  11/08/15  Interventions for Short Term Goal #1  Bathroom evaluation patient , patient has regular tub, will notify LCSW that is assisting with getting a shower chair   THN CM Short Term Goal #2 (0-30 days)  Patient will be able to report at least 2 fall prevention strategies in the next 30 days  THN CM Short Term Goal #2 Start Date  11/08/15  Interventions for Short Term Goal #2  Reviewed EMMI handout of bathroom safety and preventing falls   THN CM Short Term Goal #3 (0-30 days)  Patient will make an appointment to have Flu shot in the next 20 days   THN CM Short Term Goal #3 Start Date  11/16/15  Interventions for Short Tern Goal #3  Discussed importance and benefits of Flu Shot      Egbert Garibaldi, [place], [facility] Care Management [phone]- Mobile [phone]- Toll Free Main Office

## 2015-11-25 ENCOUNTER — Other Ambulatory Visit: Payer: Self-pay | Admitting: *Deleted

## 2015-11-25 ENCOUNTER — Encounter: Payer: Self-pay | Admitting: *Deleted

## 2015-11-25 NOTE — Patient Outreach (Signed)
[facility]) Care Management  [facility] Social Work  11/25/2015  Lindsey Willis [birth year] [id number]  Subjective:  Patient is a 40 year old female. Per patient, standing for long periods of time difficult. Needs shower char to avoid falls.  Objective:   Encounter Medications:  Outpatient Encounter Prescriptions as of 11/25/2015  Medication Sig Note  . albuterol (PROVENTIL HFA;VENTOLIN HFA) 108 (90 Base) MCG/ACT inhaler Inhale 1-2 puffs into the lungs every 6 (six) hours as needed for wheezing or shortness of breath (Patient does not use but is active prescription).   . beclomethasone (QVAR) 40 MCG/ACT inhaler Inhale 2 puffs into the lungs 2 (two) times daily.   . clonazePAM (KLONOPIN) 0.5 MG tablet Take 0.5 mg by mouth 2 (two) times daily.   . DULoxetine (CYMBALTA) 60 MG capsule Take 1 capsule (60 mg total) by mouth 2 (two) times daily.   Marland Kitchen gabapentin (NEURONTIN) 800 MG tablet Take 800 mg by mouth 4 (four) times daily.   . mirtazapine (REMERON) 30 MG tablet Take 1 tablet (30 mg total) by mouth at bedtime.   . rivaroxaban (XARELTO) 20 MG TABS tablet Take 20 mg by mouth daily.   Marland Kitchen rOPINIRole (REQUIP) 1 MG tablet Take 1 mg by mouth at bedtime.   Marland Kitchen tiotropium (SPIRIVA) 18 MCG inhalation capsule Place 18 mcg into inhaler and inhale daily. Active prescription, patient just doesn't use   . clopidogrel (PLAVIX) 75 MG tablet Take 75 mg by mouth daily. 11/16/2015: Not taking  . doxycycline (VIBRA-TABS) 100 MG tablet Take 1 tablet (100 mg total) by mouth 2 (two) times daily. (Patient not taking: Reported on 11/25/2015) 11/16/2015: Not taking  . magic mouthwash w/lidocaine SOLN Take 5 mLs by mouth 4 (four) times daily. (Patient not taking: Reported on 11/25/2015) 11/16/2015: Not taking  . promethazine (PHENERGAN) 25 MG tablet Take 1 tablet (25 mg total) by mouth every 6 (six) hours as needed for nausea or vomiting. (Patient not taking: Reported on 11/25/2015) 11/16/2015: Not taking    No facility-administered encounter medications on file as of 11/25/2015.     Functional Status:  In your present state of health, do you have any difficulty performing the following activities: 11/08/2015  Hearing? N  Vision? N  Difficulty concentrating or making decisions? N  Walking or climbing stairs? Y  Dressing or bathing? N  Doing errands, shopping? N  Preparing Food and eating ? N  Using the Toilet? N  In the past six months, have you accidently leaked urine? N  Do you have problems with loss of bowel control? N  Managing your Medications? N  Managing your Finances? N  Housekeeping or managing your Housekeeping? N  Some recent data might be hidden    Fall/Depression Screening:  PHQ 2/9 Scores 11/16/2015 10/22/2015  PHQ - 2 Score 4 4  PHQ- 9 Score 9 11    Assessment:  Patient is a 40 year old female Currently sees psychiatrist at [facility] for medication management. Patitent has been referred to a therapist through the pain clinic who will address both her pain issues and mental health needs.  Patient  has her initial  appointment on 12/08/15 with a pain doctor at the [facility]. Patient provided with a shower chair to reduce falls, per patient she has difficulty standing for long periods of time.  Patient resides with her daughter and 2 grandchildren.  Positive support through family. Patient receives Medicaid and food stamps in the amount of $69.00.  Patient drives her own car  to medical appointments.  Plan: This Child psychotherapistsocial worker will follow up with patient within one month regarding shower chair use and to assess for additional community resource needs.    Adriana ReamsChrystal Shaletta Hinostroza, LCSW [facility] Care Management [phone]

## 2015-12-07 ENCOUNTER — Ambulatory Visit: Payer: PPO | Admitting: *Deleted

## 2015-12-07 ENCOUNTER — Encounter: Payer: Self-pay | Admitting: *Deleted

## 2015-12-07 ENCOUNTER — Other Ambulatory Visit: Payer: Self-pay | Admitting: *Deleted

## 2015-12-07 NOTE — Patient Outreach (Signed)
[facility]) [employer] [facility]) Care Management  [place]  12/07/2015   SHOSHANA JOHAL August 30, 1975 [id number]  Subjective:  Patient reports she is hanging in there, discussed recent death in family on last week. Patient states she had to cancel her MD appointment on last week, but plans to reschedule, she discussed her appointment with pain clinic on tomorrow and states she will be able to drive herself there. Patient states she is tolerating using her shower chair, and it is working out well.    Encounter Medications:  Outpatient Encounter Prescriptions as of 12/07/2015  Medication Sig Note  . albuterol (PROVENTIL HFA;VENTOLIN HFA) 108 (90 Base) MCG/ACT inhaler Inhale 1-2 puffs into the lungs every 6 (six) hours as needed for wheezing or shortness of breath (Patient does not use but is active prescription).   . beclomethasone (QVAR) 40 MCG/ACT inhaler Inhale 2 puffs into the lungs 2 (two) times daily.   . clonazePAM (KLONOPIN) 0.5 MG tablet Take 0.5 mg by mouth 2 (two) times daily.   . clopidogrel (PLAVIX) 75 MG tablet Take 75 mg by mouth daily. 11/16/2015: Not taking  . doxycycline (VIBRA-TABS) 100 MG tablet Take 1 tablet (100 mg total) by mouth 2 (two) times daily. (Patient not taking: Reported on 11/25/2015) 11/16/2015: Not taking  . DULoxetine (CYMBALTA) 60 MG capsule Take 1 capsule (60 mg total) by mouth 2 (two) times daily.   Marland Kitchen gabapentin (NEURONTIN) 800 MG tablet Take 800 mg by mouth 4 (four) times daily.   . magic mouthwash w/lidocaine SOLN Take 5 mLs by mouth 4 (four) times daily. (Patient not taking: Reported on 11/25/2015) 11/16/2015: Not taking  . mirtazapine (REMERON) 30 MG tablet Take 1 tablet (30 mg total) by mouth at bedtime.   . promethazine (PHENERGAN) 25 MG tablet Take 1 tablet (25 mg total) by mouth every 6 (six) hours as needed for nausea or vomiting. (Patient not taking: Reported on 11/25/2015) 11/16/2015: Not  taking  . rivaroxaban (XARELTO) 20 MG TABS tablet Take 20 mg by mouth daily.   Marland Kitchen rOPINIRole (REQUIP) 1 MG tablet Take 1 mg by mouth at bedtime.   Marland Kitchen tiotropium (SPIRIVA) 18 MCG inhalation capsule Place 18 mcg into inhaler and inhale daily. Active prescription, patient just doesn't use    No facility-administered encounter medications on file as of 12/07/2015.     Functional Status:  In your present state of health, do you have any difficulty performing the following activities: 11/08/2015  Hearing? N  Vision? N  Difficulty concentrating or making decisions? N  Walking or climbing stairs? Y  Dressing or bathing? N  Doing errands, shopping? N  Preparing Food and eating ? N  Using the Toilet? N  In the past six months, have you accidently leaked urine? N  Do you have problems with loss of bowel control? N  Managing your Medications? N  Managing your Finances? N  Housekeeping or managing your Housekeeping? N  Some recent data might be hidden    Fall/Depression Screening: PHQ 2/9 Scores 11/16/2015 10/22/2015  PHQ - 2 Score 4 4  PHQ- 9 Score 9 11    Assessment:  Recent Loss :  Reports coping, emotional support provided.  Pain Control- to attend initial visit with pain doctor on 11/1. Fall/Safety : Tolerating use of shower chair, no recent falls Flu Shot : Patient will reschedule office visit to get flu shot, she prefer to get it at office than at pharmacy.  Plan:  Will follow up with patient within 3 weeks and if no further care management needs identified will close case to community care coordinator.  [facility] CM Care Plan Problem One   Flowsheet Row Most Recent Value  Care Plan Problem One  Knowledge  deficit of self care managment needs  evidenced by request of assistance with obtaiing needed equiipment   Role Documenting the Problem One  Care Management Coordinator  Care Plan for Problem One  Active  THN Long Term Goal (31-90 days)  Patient will report increased knowledge of  chronic disease safety  self care managment in the next 31 day   THN Long Term Goal Start Date  11/08/15  Interventions for Problem One Long Term Goal  Reinforced importance of rescheuling missed appointment from last week.  THN CM Short Term Goal #1 (0-30 days)  Patient will report being able to obtain a shower chair in the next 30 days   THN CM Short Term Goal #1 Start Date  11/08/15  [facility] CM Short Term Goal #1 Met Date  12/07/15  THN CM Short Term Goal #2 (0-30 days)  Patient will be able to report at least 2 fall prevention strategies in the next 30 days   THN CM Short Term Goal #2 Start Date  11/08/15  [facility] CM Short Term Goal #2 Met Date  12/07/15  THN CM Short Term Goal #3 (0-30 days)  Patient will make an appointment to have Flu shot in the next 20 days   THN CM Short Term Goal #3 Start Date  11/16/15  Interventions for Short Tern Goal #3  Reinforced importance and benefits of Flu Shot and scheduling to have completed       Joylene Draft, RN, [place] Management [phone]- Mobile [phone]- [place]

## 2015-12-07 NOTE — Patient Outreach (Signed)
[facility](THN) Care Management  12/07/2015  Roberts GaudyWendy M Laprade 04/01/1975 [id number]   Phone call to patient to follow up on supplies provided during last visit.  Per patient, the shower chair is working out well. Patient verbalized having no additional community resource needs.  Patient informed that her case will be closed to social work at this time.  Patient informed to contact this social worker if any additional needs arise in the future.   Adriana ReamsChrystal Land, LCSW [facility] Care Management [phone]

## 2015-12-08 DIAGNOSIS — M797 Fibromyalgia: Secondary | ICD-10-CM | POA: Diagnosis not present

## 2015-12-08 DIAGNOSIS — M5442 Lumbago with sciatica, left side: Secondary | ICD-10-CM | POA: Diagnosis not present

## 2015-12-08 DIAGNOSIS — M533 Sacrococcygeal disorders, not elsewhere classified: Secondary | ICD-10-CM | POA: Diagnosis not present

## 2015-12-08 DIAGNOSIS — G8929 Other chronic pain: Secondary | ICD-10-CM | POA: Diagnosis not present

## 2015-12-13 DIAGNOSIS — Z23 Encounter for immunization: Secondary | ICD-10-CM | POA: Diagnosis not present

## 2015-12-13 DIAGNOSIS — G8929 Other chronic pain: Secondary | ICD-10-CM | POA: Diagnosis not present

## 2015-12-13 DIAGNOSIS — M545 Low back pain: Secondary | ICD-10-CM | POA: Diagnosis not present

## 2015-12-14 ENCOUNTER — Other Ambulatory Visit: Payer: Self-pay | Admitting: *Deleted

## 2015-12-14 NOTE — Patient Outreach (Signed)
[facility](THN) Care Management  12/14/2015  [name] 07/19/1975 [id number]  Incoming call from Lindsey Willis, she discussed that she had an [place]. Patient discussed that the  doctor had written her a prescription for a TENS unit and ambulatory referral to out patient aquatic therapy to help with pain from diagnosis of sciatica problem,pain in her left leg . Patient expressed that she did not want to have to travel to [place] to attend therapy and asked for assistance in locating services in the area.   Pl[facility] to inquire, they are able to evaluate patient for outpatient services of TENS unit and aquatic therapy . [facility] for therapy, that would take place at pool [place], and then they will discuss next appointment to evaluate for TENS unit.  Placed call to Lindsey Willis to relay information, she is agreeable to setting up appointment, first available November 15, at 3 :15, informed patient that they will mail her additional information and emphasized  patient to bring paper prescription that she has been given to her appointment.     Plan Will follow up with patient in the next 2 weeks by telephone.   Egbert GaribaldiKimberly Glover, RN, [facility] Care Management [phone]- Mobile [phone]- Toll Free Main Office

## 2015-12-17 ENCOUNTER — Ambulatory Visit: Payer: PPO | Admitting: *Deleted

## 2015-12-22 ENCOUNTER — Encounter: Payer: Self-pay | Admitting: Physical Therapy

## 2015-12-22 ENCOUNTER — Ambulatory Visit: Payer: PPO | Attending: Anesthesiology | Admitting: Physical Therapy

## 2015-12-22 DIAGNOSIS — R262 Difficulty in walking, not elsewhere classified: Secondary | ICD-10-CM

## 2015-12-22 DIAGNOSIS — M6281 Muscle weakness (generalized): Secondary | ICD-10-CM | POA: Diagnosis not present

## 2015-12-22 DIAGNOSIS — G8929 Other chronic pain: Secondary | ICD-10-CM | POA: Diagnosis not present

## 2015-12-22 DIAGNOSIS — M545 Low back pain: Secondary | ICD-10-CM | POA: Insufficient documentation

## 2015-12-22 NOTE — Therapy (Signed)
[place] [facility] MAIN [facility] SERVICES [address] [place], [place], [address] Phone: [phone]   Fax:  [phone]  Physical Therapy Evaluation  Patient Details  Name: Lindsey Willis MRN: [id number] Date of Birth: 1975-06-09 Referring Provider: Dr. Consuela Mimes  Encounter Date: 12/22/2015      PT End of Session - 12/22/15 1628    Visit Number 1   Number of Visits 12   Date for PT Re-Evaluation 02/02/16   Authorization Type gcode 1   Authorization Time Period 10   PT Start Time 1545   PT Stop Time 1620   PT Time Calculation (min) 35 min   Activity Tolerance Patient limited by pain   Behavior During Therapy Anxious      Past Medical History:  Diagnosis Date  . Anemia   . Asthma   . Borderline personality disorder   . Depression   . DVT of leg (deep venous thrombosis) (HCC)   . Fibromyalgia   . May-Thurner syndrome   . PTSD (post-traumatic stress disorder)     Past Surgical History:  Procedure Laterality Date  . BREAST CYST ASPIRATION Right    neg  . CHOLECYSTECTOMY    . IVC FILTER PLACEMENT ([facility] HX)    . LEG SURGERY    . TUBAL LIGATION      There were no vitals filed for this visit.       Subjective Assessment - 12/22/15 1543    Subjective 40 yo Female reports chronic pain. She reports that she is here for evaluation because her doctor referred her. She reports living in constant pain daily. She reports most pain is mostly in left leg, and radiates into low back down right posterior hip; Patient has been diagnoses with May Thurner's disease which is a vessel disease which leads to chronic DVTs. She has had multiple stents and is on anti-coagulants. Pt reports having nerve damage in spine and reports numbness/tingling in LLE; She also reports having neuropathy in BLE and bilateral hands; Patient presents to [facility] with [facility] demonstrating a signifiant antalgic gait;    Pertinent History Personal factors affecting rehab: May  Thurner's, DVTs, Nerve damage in spine;    Limitations Standing;Walking   How long can you sit comfortably? 15 min   How long can you stand comfortably? 4-5 minutes; will force self to stand longer for household chores but will take frequent breaks;    How long can you walk comfortably? 300-500 feet max.   Diagnostic tests CAT scan Sept 2017- awaiting on results;    Patient Stated Goals reduce RLE hip pain; improve strength; be able to be more active with less discomfort;    Currently in Pain? Yes   Pain Score 7    Pain Location Hip   Pain Orientation Right;Posterior;Lower  lower back into right hip;    Pain Descriptors / Indicators Tender;Tightness;Jabbing;Aching   Pain Type Chronic pain   Pain Radiating Towards low back radiating into right hip;    Pain Onset More than a month ago   Pain Frequency Intermittent   Aggravating Factors  prolonged sitting/standing; lifting, housework;    Pain Relieving Factors rest, topical pain relief cream;    Effect of Pain on Daily Activities decreased tolerance with ADLs; no change with hot shower, icy/hot, pain meds;             [facility] PT Assessment - 12/22/15 0001      Assessment   Medical Diagnosis Chronic back pain/sciatica  Referring Provider Dr. Consuela MimesPadma Guluar   Onset Date/Surgical Date --  more than 1 year   Hand Dominance Right   Next MD Visit January 19, 2016   Prior Therapy Has had home health after being in the hospital, but no outpatient therapy for this condition;      Precautions   Precautions Fall     Restrictions   Weight Bearing Restrictions No     Balance Screen   Has the patient fallen in the past 6 months No   Has the patient had a decrease in activity level because of a fear of falling?  Yes   Is the patient reluctant to leave their home because of a fear of falling?  Yes     Home Environment   Additional Comments Lives in single story home, no stairs to enter; lives with daughter;      Prior Function   Level  of Independence Independent;Independent with gait  has been using SPC for last 6-7 years;    Vocation On disability   Leisure Have peace and quiet;      Cognition   Overall Cognitive Status Within Functional Limits for tasks assessed     Observation/Other Assessments   Modified Oswertry 60% (extreme disability)     Sensation   Additional Comments has numbness/tingling in LLE down to foot; has numbness in BLE feet and in hands; however intact deep pressure; intact light touch sensation during testing;      Coordination   Gross Motor Movements are Fluid and Coordinated Yes   Fine Motor Movements are Fluid and Coordinated Yes     Posture/Postural Control   Posture Comments will sit with heavy lean to left side, increased lumbar flexion; moderate slumped posture;      AROM   Overall AROM Comments decreased LLE hip flexion in sitting; decreased LLE knee flexion/extension; decreased LLE ankle DF due to weakness;      Strength   Overall Strength Comments RLE: Hip 3+/5, knee 4/5, ankle 4/5; LLE: hip 3-/5, knee 3+/5, ankle 3-/5     Palpation   Palpation comment has severe tenderness along right SI join and right low back;      Transfers   Comments modified independent with pushing from chair with BUE;      Ambulation/Gait   Gait Comments demonstrates significant antalgic gait pattern with increased lateral trunk swing; Patient uses a SPC in LUE but will lean over cane when walking demonstrating a 2 point gait pattern; very slow gait speed;      Standardized Balance Assessment   10 Meter Walk 0.4 m/s with SPC (high fall risk, limited home ambulator)     High Level Balance   High Level Balance Comments static standing balance is fair; patient able to stand without AD without loss of balance but unable to withstand pertubations;                            PT Education - 12/22/15 1628    Education provided Yes   Education Details recommendations, plan of care, pool     Person(s) Educated Patient   Methods Explanation   Comprehension Verbalized understanding             PT Long Term Goals - 12/22/15 1631      PT LONG TERM GOAL #1   Title Patient will increase BLE gross strength to 4+/5 as to improve functional strength for independent gait, increased  standing tolerance and increased ADL ability.   Time 6   Period Weeks   Status New     PT LONG TERM GOAL #2   Title Patient will report a worst pain of 4/10 on VAS in    right hip         to improve tolerance with ADLs and reduced symptoms with activities.    Time 6   Period Weeks   Status New     PT LONG TERM GOAL #3   Title  Patient will reduce modified Oswestry score to <40 as to demonstrate minimal disability with ADLs including improved sleeping tolerance, walking/sitting tolerance etc for better mobility with ADLs.    Time 6   Period Weeks   Status New     PT LONG TERM GOAL #4   Title Patient will be independent in home exercise program to improve strength/mobility for better functional independence with ADLs.   Time 6   Period Weeks   Status New     PT LONG TERM GOAL #5   Title Patient will increase 10 meter walk test to >1.46m/s as to improve gait speed for better community ambulation and to reduce fall risk.   Time 6   Period Weeks   Status New               Plan - 01/07/16 1628    Clinical Impression Statement 40 yo female reports to therapy with chronic pain in LLE due to May Turner's disease. This is a blood vessel disease which leads to multiple clots and DVTs. She has had several stents put in and is on an anticoagulant. Patient reports increased pain along low back and right posterior hip. She was severely tender and unable to tolerate increased palpation. However pain seems to be around right SI joint. Patient was referred for TENs/aquatic therapy. She is not appropriate for TENs due to contra-indications due to clot disorder. She would benefit from aquatic therapy  to reduce symptoms and improve strength.    Rehab Potential Fair   Clinical Impairments Affecting Rehab Potential positive: young in age, family support; negative: chronic pain, co-morbidities, smoker; Patient's clinical presentation is evolving due to increased LE pain which radiates down to knee;    PT Frequency 2x / week   PT Duration 6 weeks   PT Treatment/Interventions Aquatic Therapy;Cryotherapy;Gait training;Traction;Moist Heat;Stair training;Functional mobility training;Therapeutic activities;Therapeutic exercise;Balance training;Neuromuscular re-education;Manual techniques;Patient/family education   PT Next Visit Plan aquatic therapy- address RLE hip pain;   Recommended Other Services aquatic therapy   Consulted and Agree with Plan of Care Patient      Patient will benefit from skilled therapeutic intervention in order to improve the following deficits and impairments:  Abnormal gait, Pain, Postural dysfunction, Decreased activity tolerance, Decreased range of motion, Decreased strength, Impaired flexibility, Decreased balance, Decreased safety awareness  Visit Diagnosis: Chronic right-sided low back pain, with sciatica presence unspecified - Plan: PT plan of care cert/re-cert  Difficulty in walking, not elsewhere classified - Plan: PT plan of care cert/re-cert  Muscle weakness (generalized) - Plan: PT plan of care cert/re-cert      G-Codes - 01-07-2016 1633    Functional Assessment Tool Used 10 meter walk, modified oswestry, clinical judgement   Functional Limitation Mobility: Walking and moving around   Mobility: Walking and Moving Around Current Status (J1914) At least 60 percent but less than 80 percent impaired, limited or restricted   Mobility: Walking and Moving Around Goal Status (N8295) At least 40 percent  but less than 60 percent impaired, limited or restricted       Problem List Patient Active Problem List   Diagnosis Date Noted  . Deep vein thrombosis (DVT) (HCC)  04/08/2015  . May-Thurner syndrome 04/08/2015  . Overdose 02/09/2015  . Ache in joint 11/21/2013  . Arteriovenous fistula (HCC) 06/20/2013  . Absolute anemia 10/19/2012  . Recurrent major depressive disorder, in partial remission (HCC) 07/13/2012  . Chronic pain 01/22/2012  . Clinical depression 01/22/2012  . Disease of lung 01/22/2012  . Fibromyalgia 01/22/2012  . LBP (low back pain) 01/22/2012  . Cannot sleep 01/22/2012  . Emotional neurotic disorder 01/22/2012  . Cannabis abuse, continuous 01/22/2012  . Pulmonary embolism with infarction (HCC) 01/22/2012  . Esophagitis, reflux 01/22/2012  . Current tobacco use 01/22/2012  . Narcotic drug use 11/02/2010  . Post-phlebitic syndrome 07/05/2010  . Borderline personality disorder 06/15/2010  . Chronic deep vein thrombosis (DVT) of lower extremity (HCC) 06/15/2010  . Hereditary and idiopathic neuropathy 06/15/2010  . Neurosis, posttraumatic 06/15/2010    Rhonin Trott PT, DPT 12/22/2015, 4:35 PM   [facility] MAIN [facility] SERVICES [address] [place], [place], [address] Phone: [phone]   Fax:  [phone]  Name: Lindsey Willis MRN: [id number] Date of Birth: [birth year]

## 2015-12-28 ENCOUNTER — Encounter: Payer: Self-pay | Admitting: *Deleted

## 2015-12-28 ENCOUNTER — Other Ambulatory Visit: Payer: Self-pay | Admitting: *Deleted

## 2015-12-28 NOTE — Patient Outreach (Signed)
[place] [facility]) Care Management  12/28/2015  Lindsey Willis 1976-01-10 [id number]   Telephone assessment call  Follow up call to patient, she discussed her recent visit to outpatient therapy, states she will not be able use the TENS unit due to contraindicated due to chronic diagnosis of May-Thurner Syndrome ,and chronic clots. Patient states she is appropriate for water therapy but the pool is currently under construction,so she will begin outpatient physical therapy, she is unsure that she will be able to tolerate because of increased pain with therapy in th past,they are unsure when pool construction will be completed.   Patient discussed her recent visit with vascular doctor and CT scan that shows clots at [name] filter,with plans for follow up in January to discuss options for care. Patient discussed being pleased that she now has all of her follow up with doctors at  [facility] and is able to drive herself to appointments denies any new concerns related to transportation   Patient attending all medical appointments, reports taking medications as prescribed. She has schedule to attend therapy appointments. Patient denies any new care management need concerns, states needs have been met,agreeable with case closure and  Encouraged patient to contact us for future needs, made sure she had my contact information.   Plan Will close case , will send MD case closure letter and patient case closure letter.   Lindsey Draft, RN, [place] Management [phone]- Mobile [phone]- Toll Free Main Office

## 2016-01-04 ENCOUNTER — Ambulatory Visit: Payer: PPO | Admitting: Physical Therapy

## 2016-01-04 DIAGNOSIS — R262 Difficulty in walking, not elsewhere classified: Secondary | ICD-10-CM

## 2016-01-04 DIAGNOSIS — M6281 Muscle weakness (generalized): Secondary | ICD-10-CM

## 2016-01-04 DIAGNOSIS — G8929 Other chronic pain: Secondary | ICD-10-CM

## 2016-01-04 DIAGNOSIS — M545 Low back pain: Principal | ICD-10-CM

## 2016-01-04 NOTE — Patient Instructions (Addendum)
  Proper body mechanics with getting out of a chair to decrease strain  on back &pelvic floor   Avoid holding your breath when Getting out of the chair:  Scoot to front part of chair chair Heels behind feet, feet are hip width apart, nose over toes  Inhale like you are smelling roses Exhale to stand    __________  Bonita QuinYou are now ready to begin training the deep core muscles system: diaphragm, transverse abdominis, pelvic floor . These muscles must work together as a team.           The key to these exercises to train the brain to coordinate the timing of these muscles and to have them turn on for long periods of time to hold you upright against gravity (especially important if you are on your feet all day).These muscles are postural muscles and play a role stabilizing your spine and bodyweight. By doing these repetitions slowly and correctly instead of doing crunches, you will achieve a flatter belly without a lower pooch. You are also placing your spine in a more neutral position and breathing properly which in turn, decreases your risk for problems related to your pelvic floor, abdominal, and low back such as pelvic organ prolapse, hernias, diastasis recti (separation of superficial muscles), disk herniations, spinal fractures. These exercises set a solid foundation for you to later progress to resistance/ strength training with therabands and weights and return to other typical fitness exercises with a stronger deeper core.   Do level 1 : 10 reps Do level 2: 10 reps (left and right = 1 rep) x 3 sets , 2 x day Do not progress to level 3 for 3-4 weeks. You know you are ready when you do not have any rocking of pelvis nor arching in your back     ______  Sidelying on Left , hip extension (swinging thigh back over pillows, keep leg line with hip width)  10 reps x 2  on exhale

## 2016-01-05 NOTE — Therapy (Signed)
[facility] [address] [place], [place], [address] Phone: [phone]   Fax:  [phone]  Physical Therapy Treatment  Patient Details  Name: Lindsey Willis MRN: [id number] Date of Birth: 06/09/1975 Referring Provider: Dr. Consuela MimesPadma Guluar  Encounter Date: 01/04/2016    Past Medical History:  Diagnosis Date  . Anemia   . Asthma   . Borderline personality disorder   . Depression   . DVT of leg (deep venous thrombosis) (HCC)   . Fibromyalgia   . May-Thurner syndrome   . PTSD (post-traumatic stress disorder)     Past Surgical History:  Procedure Laterality Date  . BREAST CYST ASPIRATION Right    neg  . CHOLECYSTECTOMY    . IVC FILTER PLACEMENT ([facility] HX)    . LEG SURGERY    . TUBAL LIGATION      There were no vitals filed for this visit.      Subjective Assessment - 01/05/16 2141    Subjective R hip started to hurt one month ago that radiates down her lateral thigh and stops to mid thigh level. 7-8/10 pain level.  The pain occurs erratically. Pt states this pain is worse compared to the pain she experiences in other body parts. Pt has tried gel cream prescribed by her pain MD but it does not last.  PT inquired re: pelvic health related questions: Pt had 3 vaginal deliveries with stitches, tubal ligation, exploratory surgery, surgery for gallbladder removal and peritonitis. SUI, bowel movements occur daily without straining. LBP associated with nerve damage following  nerve blocks on L Leg      Pertinent History Personal factors affecting rehab: May Thurner's, DVTs, Nerve damage in spine;    Limitations Standing;Walking   How long can you sit comfortably? 15 min   How long can you stand comfortably? 4-5 minutes; will force self to stand longer for household chores but will take frequent breaks;    How long can you walk comfortably? 300-500 feet max.   Diagnostic tests CAT scan Sept 2017- awaiting on results;    Patient Stated Goals reduce RLE hip pain; improve strength; be able to be more active with less discomfort;    Pain Onset More than a month ago            [facility] - 01/05/16 2141      AROM   Overall AROM Comments AROM hip ext in sidelying -35 deg.  (post Tx : 0 deg)        Palpation   SI assessment  R PSIS more posterior, ASIS symmetric, leg length equal, increased tenderness along lateral border of sacrum at S2-4 level . (decreased tnderness and increased mobility post Tx)    Palpation comment Palpation at R TP at L5 with radiating pain to mid thigh (centralization of pain to PSIS post Tx)                       [facility] Adult PT Treatment/Exercise - 01/05/16 2141      Ambulation/Gait   Gait Comments post Tx: increased hip ext, increased stance phase on R foot,  increased weightbearing decreased L trunk lean on SPC after PT adjusted cane higher,       Posture/Postural Control   Posture Comments no      Therapeutic Activites    Therapeutic Activities --  see pt instructions     Manual Therapy   Manual therapy comments inferior/ superior  glide along R lateral border of sacrum, sustaiend light pressure in supine under sacrum to indrease tolerance to touch at inflamed area,  STM with MWM (clam shells, and deepcore level 2, hip extension )                      PT Long Term Goals - 01/04/16 1339      PT LONG TERM GOAL #1   Title Patient will increase BLE gross strength to 4+/5 as to improve functional strength for independent gait, increased standing tolerance and increased ADL ability.   Time 6   Period Weeks   Status On-going     PT LONG TERM GOAL #2   Title Patient will report a worst pain of 4/10 on VAS in    right hip         to improve tolerance with ADLs and reduced symptoms with activities.    Time 6   Period Weeks   Status On-going     PT LONG TERM GOAL #3   Title  Patient will reduce modified Oswestry score to <40 as to  demonstrate minimal disability with ADLs including improved sleeping tolerance, walking/sitting tolerance etc for better mobility with ADLs.    Time 6   Period Weeks   Status On-going     PT LONG TERM GOAL #4   Title Patient will be independent in home exercise program to improve strength/mobility for better functional independence with ADLs.   Time 6   Period Weeks   Status On-going     PT LONG TERM GOAL #5   Title Patient will increase 10 meter walk test to >1.86m/s as to improve gait speed for better community ambulation and to reduce fall risk.   Time 6   Period Weeks   Status On-going     Additional Long Term Goals   Additional Long Term Goals Yes     PT LONG TERM GOAL #6   Title Pt will demo no lumbopelvic perturbations with deep core level 2-3 for 5 reps in order to progress to upright, loaded exercises.  Time 6  Period Weeks  Status On-going               Plan - 01/05/16 2142    Clinical Impression Statement Pt was seen by [facility] PT today and will continue to have her future sessions with her for land appts to address her sacroiliac dysfunctions under a specialized PT. Pool sessions are withheld due to pool repairs. Pt reported her pain decreased from 7/10 to 5/10 in her R hip following Tx today. Additional changes following manual Tx and training include: less antalgic t/f from sit to stand and without use of SPC,  centralization of her pain,  increased hip extension ROM from -30 deg to 0 deg, less deviations with gait.  Pt reported she was able to walk out of the clinic with 25-30% less difficulty compared to arriving to the clinic. Pt will continue to benefit from skilled PT to address her radiculopathy, pelvic girdle hypomobility, poor body mechanics, and gait deviations.     Rehab Potential Fair   Clinical Impairments Affecting Rehab Potential positive: young in age, family support; negative: chronic pain, co-morbidities, smoker; Patient's clinical  presentation is evolving due to increased LE pain which radiates down to knee;    PT Frequency 2x / week   PT Duration 6 weeks   PT Treatment/Interventions Aquatic Therapy;Cryotherapy;Gait training;Traction;Moist Heat;Stair training;Functional mobility training;Therapeutic activities;Therapeutic exercise;Balance training;Neuromuscular  re-education;Manual techniques;Patient/family education   PT Next Visit Plan aquatic therapy- address RLE hip pain;   Consulted and Agree with Plan of Care Patient      Patient will benefit from skilled therapeutic intervention in order to improve the following deficits and impairments:  Abnormal gait, Pain, Postural dysfunction, Decreased activity tolerance, Decreased range of motion, Decreased strength, Impaired flexibility, Decreased balance, Decreased safety awareness  Visit Diagnosis: Chronic right-sided low back pain, with sciatica presence unspecified  Difficulty in walking, not elsewhere classified  Muscle weakness (generalized)     Problem List Patient Active Problem List   Diagnosis Date Noted  . Deep vein thrombosis (DVT) (HCC) 04/08/2015  . May-Thurner syndrome 04/08/2015  . Overdose 02/09/2015  . Ache in joint 11/21/2013  . Arteriovenous fistula (HCC) 06/20/2013  . Absolute anemia 10/19/2012  . Recurrent major depressive disorder, in partial remission (HCC) 07/13/2012  . Chronic pain 01/22/2012  . Clinical depression 01/22/2012  . Disease of lung 01/22/2012  . Fibromyalgia 01/22/2012  . LBP (low back pain) 01/22/2012  . Cannot sleep 01/22/2012  . Emotional neurotic disorder 01/22/2012  . Cannabis abuse, continuous 01/22/2012  . Pulmonary embolism with infarction (HCC) 01/22/2012  . Esophagitis, reflux 01/22/2012  . Current tobacco use 01/22/2012  . Narcotic drug use 11/02/2010  . Post-phlebitic syndrome 07/05/2010  . Borderline personality disorder 06/15/2010  . Chronic deep vein thrombosis (DVT) of lower extremity (HCC)  06/15/2010  . Hereditary and idiopathic neuropathy 06/15/2010  . Neurosis, posttraumatic 06/15/2010    Mariane MastersYeung,Shin Yiing ,PT, DPT, E-RYT  01/05/2016, 9:42 PM  [facility] [address] [place], [place], [address] Phone: [phone]   Fax:  [phone]  Name: Lindsey GaudyWendy M Willis MRN: [id number] Date of Birth: 07/28/1975

## 2016-01-11 ENCOUNTER — Ambulatory Visit: Payer: PPO | Attending: Anesthesiology | Admitting: Physical Therapy

## 2016-01-11 DIAGNOSIS — M6281 Muscle weakness (generalized): Secondary | ICD-10-CM | POA: Insufficient documentation

## 2016-01-11 DIAGNOSIS — R262 Difficulty in walking, not elsewhere classified: Secondary | ICD-10-CM | POA: Insufficient documentation

## 2016-01-11 DIAGNOSIS — M5441 Lumbago with sciatica, right side: Secondary | ICD-10-CM | POA: Insufficient documentation

## 2016-01-11 DIAGNOSIS — G8929 Other chronic pain: Secondary | ICD-10-CM | POA: Diagnosis not present

## 2016-01-11 DIAGNOSIS — M545 Low back pain: Secondary | ICD-10-CM | POA: Insufficient documentation

## 2016-01-11 NOTE — Patient Instructions (Addendum)
To help with circulation:  Mini squats , exhale of rise (knees are behind your toes)  10 x 3 x day and use this technique when lifting   Heel raises when sitting for > 20 min   10 reps   Continue with deep core level 2 before the point of pain

## 2016-01-13 NOTE — Therapy (Signed)
[place] [facility] [address] [place], [place], [address] Phone: [phone]   Fax:  [phone]  Physical Therapy Treatment  Patient Details  Name: Lindsey Willis MRN: [id number] Date of Birth: 06/30/1975 Referring Provider: Dr. Consuela MimesPadma Guluar  Encounter Date: 01/11/2016      PT End of Session - 01/12/16 2356    Visit Number 3   Number of Visits 12   Date for PT Re-Evaluation 02/02/16   Authorization Type gcode [id number]   PT Start Time 1145   PT Stop Time 1240   PT Time Calculation (min) 55 min   Activity Tolerance Patient tolerated treatment well;No increased pain   Behavior During Therapy WFL for tasks assessed/performed      Past Medical History:  Diagnosis Date  . Anemia   . Asthma   . Borderline personality disorder   . Depression   . DVT of leg (deep venous thrombosis) (HCC)   . Fibromyalgia   . May-Thurner syndrome   . PTSD (post-traumatic stress disorder)     Past Surgical History:  Procedure Laterality Date  . BREAST CYST ASPIRATION Right    neg  . CHOLECYSTECTOMY    . IVC FILTER PLACEMENT ([facility] HX)    . LEG SURGERY    . TUBAL LIGATION      There were no vitals filed for this visit.      Subjective Assessment - 01/12/16 2350    Subjective Pt states she has not had radiating pain across the past week. Pt reported she felt better with soreness and not pain after last session. Today pt her R hip hurts.     Pertinent History Personal factors affecting rehab: May Thurner's, DVTs, Nerve damage in spine;    Limitations Standing;Walking   How long can you sit comfortably? 15 min   How long can you stand comfortably? 4-5 minutes; will force self to stand longer for household chores but will take frequent breaks;    How long can you walk comfortably? 300-500 feet max.   Diagnostic tests CAT scan Sept 2017- awaiting on results;    Patient Stated Goals reduce RLE hip pain; improve strength; be able to be more  active with less discomfort;    Pain Onset More than a month ago            [facility] PT Assessment - 01/13/16 0006      Palpation   SI assessment  R PSIS more posterior      Ambulation/Gait   Gait Comments pt is more equal weightbearing on BLE, and less antalgic gait   heel striking with posterior COM                     [facility] Adult PT Treatment/Exercise - 01/12/16 2350      Therapeutic Activites    Therapeutic Activities --  bed mobility techniques, gait training     Manual Therapy   Manual therapy comments more tolerable to touch at this session, Grade II-III mob with MWM at sacrum , long axis distraction more relieving whereas, AP mob or hip flex 90 deg caused tearful pain above R iliac crest                     PT Long Term Goals - 01/13/16 0014      PT LONG TERM GOAL #1   Title Patient will increase BLE gross strength to 4+/5 as to improve functional strength for  independent gait, increased standing tolerance and increased ADL ability.   Time 6   Period Weeks   Status On-going     PT LONG TERM GOAL #2   Title Patient will report a worst pain of 4/10 on VAS in    right hip         to improve tolerance with ADLs and reduced symptoms with activities.    Time 6   Period Weeks   Status On-going     PT LONG TERM GOAL #3   Title  Patient will reduce modified Oswestry score to <40 as to demonstrate minimal disability with ADLs including improved sleeping tolerance, walking/sitting tolerance etc for better mobility with ADLs.    Time 6   Period Weeks   Status On-going     PT LONG TERM GOAL #4   Title Patient will be independent in home exercise program to improve strength/mobility for better functional independence with ADLs.   Time 6   Period Weeks   Status On-going     PT LONG TERM GOAL #5   Title Patient will increase 10 meter walk test to >1.8157m/s as to improve gait speed for better community ambulation and to reduce fall risk.   Time 6    Period Weeks   Status On-going     PT LONG TERM GOAL #6   Title Pt will demo no pelvic obliquities (equal alignment of PSIS ) across 2 visits in order to progress to hip extension exercises and improve gait   Time 12   Period Weeks   Status New             Patient will benefit from skilled therapeutic intervention in order to improve the following deficits and impairments:  Abnormal gait, Pain, Postural dysfunction, Decreased activity tolerance, Decreased range of motion, Decreased strength, Impaired flexibility, Decreased balance, Decreased safety awareness  Visit Diagnosis: Chronic right-sided low back pain, with sciatica presence unspecified  Difficulty in walking, not elsewhere classified  Muscle weakness (generalized)     Problem List Patient Active Problem List   Diagnosis Date Noted  . Deep vein thrombosis (DVT) (HCC) 04/08/2015  . May-Thurner syndrome 04/08/2015  . Overdose 02/09/2015  . Ache in joint 11/21/2013  . Arteriovenous fistula (HCC) 06/20/2013  . Absolute anemia 10/19/2012  . Recurrent major depressive disorder, in partial remission (HCC) 07/13/2012  . Chronic pain 01/22/2012  . Clinical depression 01/22/2012  . Disease of lung 01/22/2012  . Fibromyalgia 01/22/2012  . LBP (low back pain) 01/22/2012  . Cannot sleep 01/22/2012  . Emotional neurotic disorder 01/22/2012  . Cannabis abuse, continuous 01/22/2012  . Pulmonary embolism with infarction (HCC) 01/22/2012  . Esophagitis, reflux 01/22/2012  . Current tobacco use 01/22/2012  . Narcotic drug use 11/02/2010  . Post-phlebitic syndrome 07/05/2010  . Borderline personality disorder 06/15/2010  . Chronic deep vein thrombosis (DVT) of lower extremity (HCC) 06/15/2010  . Hereditary and idiopathic neuropathy 06/15/2010  . Neurosis, posttraumatic 06/15/2010    Mariane MastersYeung,Shin Yiing ,PT, DPT, E-RYT  01/13/2016, 12:15 AM  [place] [facility] [address] [place], [place], [address] Phone: [phone]   Fax:  [phone]  Name: Lindsey Willis MRN: [id number] Date of Birth: 02/27/1975

## 2016-01-18 ENCOUNTER — Ambulatory Visit: Payer: PPO | Admitting: Physical Therapy

## 2016-01-18 DIAGNOSIS — G8929 Other chronic pain: Secondary | ICD-10-CM

## 2016-01-18 DIAGNOSIS — M545 Low back pain: Principal | ICD-10-CM

## 2016-01-18 DIAGNOSIS — R262 Difficulty in walking, not elsewhere classified: Secondary | ICD-10-CM

## 2016-01-18 DIAGNOSIS — M6281 Muscle weakness (generalized): Secondary | ICD-10-CM

## 2016-01-19 DIAGNOSIS — G8929 Other chronic pain: Secondary | ICD-10-CM | POA: Diagnosis not present

## 2016-01-19 DIAGNOSIS — G2581 Restless legs syndrome: Secondary | ICD-10-CM | POA: Diagnosis not present

## 2016-01-19 DIAGNOSIS — M5442 Lumbago with sciatica, left side: Secondary | ICD-10-CM | POA: Diagnosis not present

## 2016-01-19 DIAGNOSIS — M797 Fibromyalgia: Secondary | ICD-10-CM | POA: Diagnosis not present

## 2016-01-19 DIAGNOSIS — G609 Hereditary and idiopathic neuropathy, unspecified: Secondary | ICD-10-CM | POA: Diagnosis not present

## 2016-01-19 NOTE — Therapy (Signed)
[facility] [address] [place], [place], [address] Phone: [phone]   Fax:  [phone]  Physical Therapy Treatment  Patient Details  Name: Lindsey Willis MRN: [id number] Date of Birth: 02/07/1975 Referring Provider: Dr. Consuela MimesPadma Guluar  Encounter Date: 01/18/2016      PT End of Session - 01/19/16 2101    Visit Number 4   Number of Visits 12   Date for PT Re-Evaluation 02/02/16   Authorization Type gcode 4   PT Start Time 1120   PT Stop Time 1225   PT Time Calculation (min) 65 min   Activity Tolerance Patient tolerated treatment well;No increased pain   Behavior During Therapy WFL for tasks assessed/performed      Past Medical History:  Diagnosis Date  . Anemia   . Asthma   . Borderline personality disorder   . Depression   . DVT of leg (deep venous thrombosis) (HCC)   . Fibromyalgia   . May-Thurner syndrome   . PTSD (post-traumatic stress disorder)     Past Surgical History:  Procedure Laterality Date  . BREAST CYST ASPIRATION Right    neg  . CHOLECYSTECTOMY    . IVC FILTER PLACEMENT ([facility] HX)    . LEG SURGERY    . TUBAL LIGATION      There were no vitals filed for this visit.      Subjective Assessment - 01/18/16 1418    Subjective Pt states she has not had radiating pain across the past 2 weeks. Today, she feels R lateral aspect of her hip hurting when she steps foward. Walking is painful but it is not like the pain that she has had with DVTs.     Pertinent History Personal factors affecting rehab: May Thurner's, DVTs, Nerve damage in spine;    Limitations Standing;Walking   How long can you sit comfortably? 15 min   How long can you stand comfortably? 4-5 minutes; will force self to stand longer for household chores but will take frequent breaks;    How long can you walk comfortably? 300-500 feet max.   Diagnostic tests CAT scan Sept 2017- awaiting on results;    Patient Stated Goals reduce  RLE hip pain; improve strength; be able to be more active with less discomfort;    Pain Onset More than a month ago            [facility] - 01/19/16 2044      Floor to Stand   Comments pre-Tx: antalgic, flinching pain. Post Tx with cue to ext R knee, pt reported less pain     AROM   Overall AROM Comments screaming pain w/ R hip flex at 90deg (pre Tx), less intense pain and more tolerant of hip flexion to ~80 w/ heel slide (post Tx)      Palpation   SI assessment  tenderness at R PSIS, increased fascial restrictions over anterior thigh, above iliac crest. (post Tx: decreased)     Ambulation/Gait   Assistive device --  antalgic w/SPC, less antalgic with hiking poles   Gait Pattern --  step thru pattern improved w/ hiking poles   Gait Comments hel striking, hyperextended knee, narrow BOS (post Tx: pt reported less pain to hips with unlocked knees and less heel striking                      [facility] Treatment/Exercise - 01/19/16 2044  Therapeutic Activites    Therapeutic Activities --  10 x 4 ft hiking poles gait training     Neuro Re-ed    Neuro Re-ed Details  gait cues: wider BOS, forefoot striking, step through pattern with hiking poles, sit to stand: ext R knee to decrease R hip pain     Manual Therapy   Manual therapy comments long axis distraction RLE, fascial releases above iliac crest , medial and lateral thigh.                      PT Long Term Goals - 01/19/16 2109      PT LONG TERM GOAL #1   Title Patient will increase BLE gross strength to 4+/5 as to improve functional strength for independent gait, increased standing tolerance and increased ADL ability.   Time 6   Period Weeks   Status On-going     PT LONG TERM GOAL #2   Title Patient will report a worst pain of 4/10 on VAS in    right hip         to improve tolerance with ADLs and reduced symptoms with activities.    Time 6   Period Weeks   Status On-going      PT LONG TERM GOAL #3   Title  Patient will reduce modified Oswestry score to <40 as to demonstrate minimal disability with ADLs including improved sleeping tolerance, walking/sitting tolerance etc for better mobility with ADLs.    Time 6   Period Weeks   Status On-going     PT LONG TERM GOAL #4   Title Patient will be independent in home exercise program to improve strength/mobility for better functional independence with ADLs.   Time 6   Period Weeks   Status On-going     PT LONG TERM GOAL #5   Title Patient will increase 10 meter walk test to >1.731m/s as to improve gait speed for better community ambulation and to reduce fall risk.   Time 6   Period Weeks   Status On-going     Additional Long Term Goals   Additional Long Term Goals Yes     PT LONG TERM GOAL #6   Title Pt will demo no pelvic obliquities (equal alignment of PSIS ) across 2 visits in order to progress to hip extension exercises and improve gait   Time 12   Period Weeks   Status Achieved     PT LONG TERM GOAL #7   Title Pt will demo more forefoot weight bear with less hypextendsion of her knees in gait in order to show good carry over with training and to walk with improved mechanics and less pain   Time 12   Period Weeks   Status New               Plan - 01/18/16 1410    Clinical Impression Statement Pt reported 20% decrease in pain following Tx today. Pt showed good carry over with less pelvic obliquities but pt is limited with R hip flexion at 90 deg (supine and sit <>stand) due to extreme pain. Sit to stand t/f was modified with less hip flexion to decrease pain. Gait was modified with BUE support on hiking poles to faciliate a step through pattern to maintain hip mobility and correct  pt's compensatory pattern that accompanied the use of SPC. Pt tolerated fascial releases with gentle manual Tx over anterior thigh and above iliac crest of R hip and  was able to tolerate heel slide to increase hip flexion  following Tx. Plan to communicate with pt's MD and get imagining reports to r/o avascular necrosis, labral tear, or possible other associated issues related to her stents.  Modify POC appropriately with continued skilled PT.   Rehab Potential Fair   Clinical Impairments Affecting Rehab Potential positive: young in age, family support; negative: chronic pain, co-morbidities, smoker; Patient's clinical presentation is evolving due to increased LE pain which radiates down to knee;    PT Frequency 2x / week   PT Duration 6 weeks   PT Treatment/Interventions Aquatic Therapy;Cryotherapy;Gait training;Traction;Moist Heat;Stair training;Functional mobility training;Therapeutic activities;Therapeutic exercise;Balance training;Neuromuscular re-education;Manual techniques;Patient/family education   PT Next Visit Plan aquatic therapy- address RLE hip pain;   Consulted and Agree with Plan of Care Patient      Patient will benefit from skilled therapeutic intervention in order to improve the following deficits and impairments:  Abnormal gait, Pain, Postural dysfunction, Decreased activity tolerance, Decreased range of motion, Decreased strength, Impaired flexibility, Decreased balance, Decreased safety awareness  Visit Diagnosis: Chronic right-sided low back pain, with sciatica presence unspecified  Difficulty in walking, not elsewhere classified  Muscle weakness (generalized)     Problem List Patient Active Problem List   Diagnosis Date Noted  . Deep vein thrombosis (DVT) (HCC) 04/08/2015  . May-Thurner syndrome 04/08/2015  . Overdose 02/09/2015  . Ache in joint 11/21/2013  . Arteriovenous fistula (HCC) 06/20/2013  . Absolute anemia 10/19/2012  . Recurrent major depressive disorder, in partial remission (HCC) 07/13/2012  . Chronic pain 01/22/2012  . Clinical depression 01/22/2012  . Disease of lung 01/22/2012  . Fibromyalgia 01/22/2012  . LBP (low back pain) 01/22/2012  . Cannot sleep  01/22/2012  . Emotional neurotic disorder 01/22/2012  . Cannabis abuse, continuous 01/22/2012  . Pulmonary embolism with infarction (HCC) 01/22/2012  . Esophagitis, reflux 01/22/2012  . Current tobacco use 01/22/2012  . Narcotic drug use 11/02/2010  . Post-phlebitic syndrome 07/05/2010  . Borderline personality disorder 06/15/2010  . Chronic deep vein thrombosis (DVT) of lower extremity (HCC) 06/15/2010  . Hereditary and idiopathic neuropathy 06/15/2010  . Neurosis, posttraumatic 06/15/2010    Mariane Masters ,PT, DPT, E-RYT  01/19/2016, 9:11 PM  [place] [facility] MAIN [facility] SERVICES [address] [place], [place], [address] Phone: [phone]   Fax:  [phone]  Name: ANESSIA OAKLAND MRN: [id number] Date of Birth: 1975/06/26

## 2016-01-25 ENCOUNTER — Ambulatory Visit: Payer: PPO | Admitting: Physical Therapy

## 2016-01-25 DIAGNOSIS — G8929 Other chronic pain: Secondary | ICD-10-CM

## 2016-01-25 DIAGNOSIS — R262 Difficulty in walking, not elsewhere classified: Secondary | ICD-10-CM

## 2016-01-25 DIAGNOSIS — M545 Low back pain: Secondary | ICD-10-CM | POA: Diagnosis not present

## 2016-01-25 DIAGNOSIS — M6281 Muscle weakness (generalized): Secondary | ICD-10-CM

## 2016-01-25 NOTE — Therapy (Addendum)
[place] [facility] [address] [place], [place], [address] Phone: [phone]   Fax:  [phone]  Physical Therapy Treatment  Patient Details  Name: Lindsey Willis MRN: [id number] Date of Birth: 05/08/1975 Referring Provider: Dr. Consuela MimesPadma Guluar  Encounter Date: 01/25/2016    Past Medical History:  Diagnosis Date  . Anemia   . Asthma   . Borderline personality disorder   . Depression   . DVT of leg (deep venous thrombosis) (HCC)   . Fibromyalgia   . May-Thurner syndrome   . PTSD (post-traumatic stress disorder)     Past Surgical History:  Procedure Laterality Date  . BREAST CYST ASPIRATION Right    neg  . CHOLECYSTECTOMY    . IVC FILTER PLACEMENT ([facility] HX)    . LEG SURGERY    . TUBAL LIGATION      There were no vitals filed for this visit.      Subjective Assessment - 01/25/16 1325    Subjective Pt reported she continues to not have radiating pain down her R leg. Pt was prescribed mm relaxers last week which helped her to manage the remaining pain that exists in her R upper glut/ lumbar area on the R.  Pt did not take any muscle relaxers today prior to the session.    Pertinent History Personal factors affecting rehab: May Thurner's, DVTs, Nerve damage in spine;    Limitations Standing;Walking   How long can you sit comfortably? 15 min   How long can you stand comfortably? 4-5 minutes; will force self to stand longer for household chores but will take frequent breaks;    How long can you walk comfortably? 300-500 feet max.   Diagnostic tests CAT scan Sept 2017- awaiting on results;    Patient Stated Goals reduce RLE hip pain; improve strength; be able to be more active with less discomfort;    Pain Onset More than a month ago            [facility] - 01/25/16 1344      AROM   Overall AROM Comments increased pain with L sidestepping and seated trunk rotation ~20deg L and R. less pain with minimal  steps R sidestepping and stepping backwards.      Palpation   SI assessment  tenderness at the level of R iliac crest, (flinching, severe senstivity to palpation).      Ambulation/Gait   Gait Comments upper trap/ paraspinal overuse on L with cane on LUE (pt's self-selected side). pt reported feel no LOB with cane on RUE, demonstrating less compensatory mm overuse in shoulders and lumbar spine                     [facility] Adult PT Treatment/Exercise - 01/25/16 1344      Neuro Re-ed    Neuro Re-ed Details  pelvic tilt seated and diaphragmatic breathing to minimize mm spasms in R hip/ back. Cued to hold cane in RUE to minimize overuse of upper trap and paraspinal/ thoracolumbar mm on L       Moist Heat Therapy   Number Minutes Moist Heat 5 Minutes   Moist Heat Location Lumbar Spine                     PT Long Term Goals - 01/19/16 2109      PT LONG TERM GOAL #1   Title Patient will increase BLE gross strength to 4+/5 as  to improve functional strength for independent gait, increased standing tolerance and increased ADL ability.   Time 6   Period Weeks   Status On-going     PT LONG TERM GOAL #2   Title Patient will report a worst pain of 4/10 on VAS in    right hip         to improve tolerance with ADLs and reduced symptoms with activities.    Time 6   Period Weeks   Status On-going     PT LONG TERM GOAL #3   Title  Patient will reduce modified Oswestry score to <40 as to demonstrate minimal disability with ADLs including improved sleeping tolerance, walking/sitting tolerance etc for better mobility with ADLs.    Time 6   Period Weeks   Status On-going     PT LONG TERM GOAL #4   Title Patient will be independent in home exercise program to improve strength/mobility for better functional independence with ADLs.   Time 6   Period Weeks   Status On-going     PT LONG TERM GOAL #5   Title Patient will increase 10 meter walk test to >1.69m/s as to improve  gait speed for better community ambulation and to reduce fall risk.   Time 6   Period Weeks   Status On-going     Additional Long Term Goals   Additional Long Term Goals Yes     PT LONG TERM GOAL #6   Title Pt will demo no pelvic obliquities (equal alignment of PSIS ) across 2 visits in order to progress to hip extension exercises and improve gait   Time 12   Period Weeks   Status Achieved     PT LONG TERM GOAL #7   Title Pt will demo more forefoot weight bear with less hypextendsion of her knees in gait in order to show good carry over with training and to walk with improved mechanics and less pain   Time 12   Period Weeks   Status New               Plan - 01/25/16 1357    Clinical Impression Statement Pt arrived 30 min late to appt. Pt appeared with less antalgic gait with SPC on RUE compared to last session. Pt showed good carry over with step through gait pattern with use of hiking poles in BUE and increased hip mobility. Pt 's remaining complaint resides in the R posterior iliac crest area with associated increased inflammation and severe tenderness to touch.  This pain was worsened with L sidestepping, seated trunk rotation ~20 deg B. Pt tolerated backward walking with hiking poles BUE after cues for smaller steps (~ hip ext 10deg).  Pt 's pain subsided with a heat pack and diaphragmatic breathing (paced breathing) and minor pelvic tilts. Pt was educated and shown anatomy images to understand more about her back spasms and the effect of using her cane incorrectly which caused decreased BOS for hip weightbearing given she reports needing more support for her L leg which has sustained nerve damage.   Suspect pt's R hip/back spasm is likely associated with her overuse of L upper trap/ paraspinal mm and heavy LUE  weight bearing over cane on L side and thus, impacting the muscular and fascial connection through the thoracolumbar system to R posterior hip/gluts. Pt demo'd stability and  ease with walking with cane adjusted to the RUE. Pt reported "pulling sensation" in her back /hip but no pain nor  did she feel unsteady after switching her cane to her RUE.  PT will request X-rays only if pt does not continue to show progress after next session. Pt continues to benefit from skilled PT.    Rehab Potential Fair   Clinical Impairments Affecting Rehab Potential positive: young in age, family support; negative: chronic pain, co-morbidities, smoker; Patient's clinical presentation is evolving due to increased LE pain which radiates down to knee;    PT Frequency 2x / week   PT Duration 6 weeks   PT Treatment/Interventions Aquatic Therapy;Cryotherapy;Gait training;Traction;Moist Heat;Stair training;Functional mobility training;Therapeutic activities;Therapeutic exercise;Balance training;Neuromuscular re-education;Manual techniques;Patient/family education   PT Next Visit Plan aquatic therapy- address RLE hip pain;   Consulted and Agree with Plan of Care Patient      Patient will benefit from skilled therapeutic intervention in order to improve the following deficits and impairments:  Abnormal gait, Pain, Postural dysfunction, Decreased activity tolerance, Decreased range of motion, Decreased strength, Impaired flexibility, Decreased balance, Decreased safety awareness  Visit Diagnosis: Chronic right-sided low back pain, with sciatica presence unspecified  Difficulty in walking, not elsewhere classified  Muscle weakness (generalized)     Problem List Patient Active Problem List   Diagnosis Date Noted  . Deep vein thrombosis (DVT) (HCC) 04/08/2015  . May-Thurner syndrome 04/08/2015  . Overdose 02/09/2015  . Ache in joint 11/21/2013  . Arteriovenous fistula (HCC) 06/20/2013  . Absolute anemia 10/19/2012  . Recurrent major depressive disorder, in partial remission (HCC) 07/13/2012  . Chronic pain 01/22/2012  . Clinical depression 01/22/2012  . Disease of lung 01/22/2012  .  Fibromyalgia 01/22/2012  . LBP (low back pain) 01/22/2012  . Cannot sleep 01/22/2012  . Emotional neurotic disorder 01/22/2012  . Cannabis abuse, continuous 01/22/2012  . Pulmonary embolism with infarction (HCC) 01/22/2012  . Esophagitis, reflux 01/22/2012  . Current tobacco use 01/22/2012  . Narcotic drug use 11/02/2010  . Post-phlebitic syndrome 07/05/2010  . Borderline personality disorder 06/15/2010  . Chronic deep vein thrombosis (DVT) of lower extremity (HCC) 06/15/2010  . Hereditary and idiopathic neuropathy 06/15/2010  . Neurosis, posttraumatic 06/15/2010    Mariane MastersYeung,Shin Yiing  ,PT, DPT, E-RYT  01/25/2016, 2:24 PM  [place] [facility] [address] [place], [place], [address] Phone: [phone]   Fax:  [phone]  Name: Lindsey Willis MRN: [id number] Date of Birth: 10/26/1975

## 2016-02-01 ENCOUNTER — Ambulatory Visit: Payer: PPO | Admitting: Physical Therapy

## 2016-02-01 DIAGNOSIS — G8929 Other chronic pain: Secondary | ICD-10-CM

## 2016-02-01 DIAGNOSIS — R262 Difficulty in walking, not elsewhere classified: Secondary | ICD-10-CM

## 2016-02-01 DIAGNOSIS — M6281 Muscle weakness (generalized): Secondary | ICD-10-CM

## 2016-02-01 DIAGNOSIS — M545 Low back pain: Secondary | ICD-10-CM

## 2016-02-01 NOTE — Therapy (Addendum)
[facility] MAIN [facility] SERVICES [address] , [place], [address] Phone: [phone]   Fax:  [phone]  Physical Therapy Treatment  Patient Details  Name: Lindsey Willis MRN: [id number] Date of Birth: 03/12/1975 Referring Provider: Dr. Consuela MimesPadma Guluar  Encounter Date: 02/01/2016      PT End of Session - 02/01/16 1152    Visit Number 5   Number of Visits 12   Date for PT Re-Evaluation 02/02/16   Authorization Type gcode 5   PT Start Time 1112   PT Stop Time 1155   PT Time Calculation (min) 43 min   Activity Tolerance Patient tolerated treatment well;No increased pain   Behavior During Therapy WFL for tasks assessed/performed      Past Medical History:  Diagnosis Date  . Anemia   . Asthma   . Borderline personality disorder   . Depression   . DVT of leg (deep venous thrombosis) (HCC)   . Fibromyalgia   . May-Thurner syndrome   . PTSD (post-traumatic stress disorder)     Past Surgical History:  Procedure Laterality Date  . BREAST CYST ASPIRATION Right    neg  . CHOLECYSTECTOMY    . IVC FILTER PLACEMENT ([facility] HX)    . LEG SURGERY    . TUBAL LIGATION      There were no vitals filed for this visit.      Subjective Assessment - 02/01/16 1143    Subjective Pt reports her pain is not hurting as much constantly compared to when she first came to PT. Pt is able to tolerate the pain better and her mm relaxer mm has also helped. Pt was able to stand on her feet all day prepping meals for xmas and had no worsening of pain.  Pt has not been able to cook a full meal before.     Pertinent History Personal factors affecting rehab: May Thurner's, DVTs, Nerve damage in spine;    Limitations Standing;Walking   How long can you sit comfortably? 15 min   How long can you stand comfortably? 4-5 minutes; will force self to stand longer for household chores but will take frequent breaks;    How long can you walk comfortably? 300-500 feet  max.   Diagnostic tests CAT scan Sept 2017- awaiting on results;    Patient Stated Goals reduce RLE hip pain; improve strength; be able to be more active with less discomfort;    Pain Onset More than a month ago            [facility] - 02/01/16 1144      AROM   Overall AROM Comments supine, hip ER (pore Tx: 1~10 deg,, post Tx ~20 deg).  hip flex/ knee flex. hip slight ER: hip flex at ~100 deg  R ,  without ER, hip flexion limited to 100 deg with pain!      Palpation   SI assessment  less tenderness at the level of R iliac crest, (flinching, less senstivity to palpation.    Palpation comment noted slight fascial restriction over inguinal and medial thigh R, referred pain to the medial thigh with fascial release over LQ R abdominal scar. )  Fascial releases over adductors referred pain to posterior iliac crest      Ambulation/Gait   Gait Comments improved gait with SPC on R, required shortening of cane to decrease R upper trap hike.  decreased R hip flexion on swing.  hip IR with stance pahse (  improved post Tx)                      [facility] Adult PT Treatment/Exercise - 02/01/16 1144      Therapeutic Activites    Therapeutic Activities --  see pt isntructions     Manual Therapy   Manual therapy comments fascial release and distraction RLE over areas indicated in assessment                 PT Education - 02/01/16 1152    Education provided Yes   Education Details HEP   Person(s) Educated Patient   Methods Explanation;Demonstration;Tactile cues;Verbal cues;Handout   Comprehension Returned demonstration;Verbalized understanding             PT Long Term Goals - 01/19/16 2109      PT LONG TERM GOAL #1   Title Patient will increase BLE gross strength to 4+/5 as to improve functional strength for independent gait, increased standing tolerance and increased ADL ability.   Time 6   Period Weeks   Status On-going     PT LONG TERM GOAL #2    Title Patient will report a worst pain of 4/10 on VAS in    right hip         to improve tolerance with ADLs and reduced symptoms with activities.    Time 6   Period Weeks   Status On-going     PT LONG TERM GOAL #3   Title  Patient will reduce modified Oswestry score to <40 as to demonstrate minimal disability with ADLs including improved sleeping tolerance, walking/sitting tolerance etc for better mobility with ADLs.    Time 6   Period Weeks   Status On-going     PT LONG TERM GOAL #4   Title Patient will be independent in home exercise program to improve strength/mobility for better functional independence with ADLs.   Time 6   Period Weeks   Status On-going     PT LONG TERM GOAL #5   Title Patient will increase 10 meter walk test to >1.2521m/s as to improve gait speed for better community ambulation and to reduce fall risk.   Time 6   Period Weeks   Status On-going     Additional Long Term Goals   Additional Long Term Goals Yes     PT LONG TERM GOAL #6   Title Pt will demo no pelvic obliquities (equal alignment of PSIS ) across 2 visits in order to progress to hip extension exercises and improve gait   Time 12   Period Weeks   Status Achieved     PT LONG TERM GOAL #7   Title Pt will demo more forefoot weight bear with less hypextension of her knees in gait in order to show good carry over with training and to walk with improved mechanics and less pain   Time 12   Period Weeks   Status Achieved                Plan - 02/01/16 1153    Clinical Impression Statement Pt is progressing well with improved step-through gait when using SPC on her RUE as recommended.  Pt had good carry over with no posterior pain compared to last session. Following Tx today, pt reported feeling 40% improvement with her anterior hip pain. Pt also showed improved  hip ER and  hip flex with slightly ER. Pt showed decreased fascial restrictions over medial thigh and below R  AIIS follow Tx. Pt  progressed to deep core level 3. Anticipate this exercise will help pt to increase hip flex during gait. Pt continues to benefit skilled PT.     Rehab Potential Fair   Clinical Impairments Affecting Rehab Potential positive: young in age, family support; negative: chronic pain, co-morbidities, smoker; Patient's clinical presentation is evolving due to increased LE pain which radiates down to knee;    PT Frequency 2x / week   PT Duration 6 weeks   PT Treatment/Interventions Aquatic Therapy;Cryotherapy;Gait training;Traction;Moist Heat;Stair training;Functional mobility training;Therapeutic activities;Therapeutic exercise;Balance training;Neuromuscular re-education;Manual techniques;Patient/family education   PT Next Visit Plan aquatic therapy- address RLE hip pain;   Consulted and Agree with Plan of Care Patient      Patient will benefit from skilled therapeutic intervention in order to improve the following deficits and impairments:  Abnormal gait, Pain, Postural dysfunction, Decreased activity tolerance, Decreased range of motion, Decreased strength, Impaired flexibility, Decreased balance, Decreased safety awareness  Visit Diagnosis: Difficulty in walking, not elsewhere classified  Muscle weakness (generalized)  Chronic right-sided low back pain, with sciatica presence unspecified     Problem List Patient Active Problem List   Diagnosis Date Noted  . Deep vein thrombosis (DVT) (HCC) 04/08/2015  . May-Thurner syndrome 04/08/2015  . Overdose 02/09/2015  . Ache in joint 11/21/2013  . Arteriovenous fistula (HCC) 06/20/2013  . Absolute anemia 10/19/2012  . Recurrent major depressive disorder, in partial remission (HCC) 07/13/2012  . Chronic pain 01/22/2012  . Clinical depression 01/22/2012  . Disease of lung 01/22/2012  . Fibromyalgia 01/22/2012  . LBP (low back pain) 01/22/2012  . Cannot sleep 01/22/2012  . Emotional neurotic disorder 01/22/2012  . Cannabis abuse, continuous  01/22/2012  . Pulmonary embolism with infarction (HCC) 01/22/2012  . Esophagitis, reflux 01/22/2012  . Current tobacco use 01/22/2012  . Narcotic drug use 11/02/2010  . Post-phlebitic syndrome 07/05/2010  . Borderline personality disorder 06/15/2010  . Chronic deep vein thrombosis (DVT) of lower extremity (HCC) 06/15/2010  . Hereditary and idiopathic neuropathy 06/15/2010  . Neurosis, posttraumatic 06/15/2010    Mariane Masters ,PT, DPT, E-RYT  02/01/2016, 1:23 PM  [facility] MAIN [facility] SERVICES [address] [place], [place], [address] Phone: [phone]   Fax:  [phone]  Name: Lindsey Willis MRN: [id number] Date of Birth: Feb 16, 1975

## 2016-02-01 NOTE — Patient Instructions (Signed)
You are now ready to begin training the deep core muscles system: diaphragm, transverse abdominis, pelvic floor . These muscles must work together as a team.           The key to these exercises to train the brain to coordinate the timing of these muscles and to have them turn on for long periods of time to hold you upright against gravity (especially important if you are on your feet all day).These muscles are postural muscles and play a role stabilizing your spine and bodyweight. By doing these repetitions slowly and correctly instead of doing crunches, you will achieve a flatter belly without a lower pooch. You are also placing your spine in a more neutral position and breathing properly which in turn, decreases your risk for problems related to your pelvic floor, abdominal, and low back such as pelvic organ prolapse, hernias, diastasis recti (separation of superficial muscles), disk herniations, spinal fractures. These exercises set a solid foundation for you to later progress to resistance/ strength training with therabands and weights and return to other typical fitness exercises with a stronger deeper core.    Do level 3: 10 reps (left and right = 1 rep) x 3 sets , 2 x day     Lay on back, start with toes pointed slightly out, roll R toes out to the R with the hip 10 reps before the point of pain     Walking with toes slightly pointed out.

## 2016-02-08 ENCOUNTER — Encounter: Payer: PPO | Admitting: Physical Therapy

## 2016-02-08 DIAGNOSIS — I82220 Acute embolism and thrombosis of inferior vena cava: Secondary | ICD-10-CM | POA: Diagnosis not present

## 2016-02-10 ENCOUNTER — Ambulatory Visit: Payer: PPO | Admitting: Physical Therapy

## 2016-02-15 ENCOUNTER — Ambulatory Visit: Payer: PPO | Attending: Anesthesiology | Admitting: Physical Therapy

## 2016-02-15 DIAGNOSIS — R262 Difficulty in walking, not elsewhere classified: Secondary | ICD-10-CM | POA: Insufficient documentation

## 2016-02-15 DIAGNOSIS — M6281 Muscle weakness (generalized): Secondary | ICD-10-CM | POA: Insufficient documentation

## 2016-02-15 DIAGNOSIS — G8929 Other chronic pain: Secondary | ICD-10-CM | POA: Diagnosis not present

## 2016-02-15 DIAGNOSIS — M545 Low back pain: Secondary | ICD-10-CM | POA: Diagnosis not present

## 2016-02-15 NOTE — Therapy (Signed)
[facility] MAIN [facility] SERVICES [address] [place], [place], [address] Phone: [phone]   Fax:  [phone]  Physical Therapy Treatment  Patient Details  Name: Lindsey Willis MRN: [id number] Date of Birth: 21-May-1975 Referring Provider: Dr. Consuela Mimes  Encounter Date: 02/15/2016      PT End of Session - 02/15/16 0907    Visit Number 6   Number of Visits 12   Date for PT Re-Evaluation 05/15/16   Authorization Type gcode 6   PT Start Time 0900   PT Stop Time 0933   PT Time Calculation (min) 33 min   Activity Tolerance Patient tolerated treatment well;No increased pain   Behavior During Therapy WFL for tasks assessed/performed      Past Medical History:  Diagnosis Date  . Anemia   . Asthma   . Borderline personality disorder   . Depression   . DVT of leg (deep venous thrombosis) (HCC)   . Fibromyalgia   . May-Thurner syndrome   . PTSD (post-traumatic stress disorder)     Past Surgical History:  Procedure Laterality Date  . BREAST CYST ASPIRATION Right    neg  . CHOLECYSTECTOMY    . IVC FILTER PLACEMENT ([facility] HX)    . LEG SURGERY    . TUBAL LIGATION      There were no vitals filed for this visit.      Subjective Assessment - 02/15/16 0905    Subjective Pt reports her R hip pain has not been hurting as often. It does not hurt when she wakes up. It only hurts by the end of the day.    Pertinent History Personal factors affecting rehab: May Thurner's, DVTs, Nerve damage in spine;    Limitations Standing;Walking   How long can you sit comfortably? 15 min   How long can you stand comfortably? 4-5 minutes; will force self to stand longer for household chores but will take frequent breaks;    How long can you walk comfortably? 300-500 feet max.   Diagnostic tests CAT scan Sept 2017- awaiting on results;    Patient Stated Goals reduce RLE hip pain; improve strength; be able to be more active with less discomfort;    Pain  Onset More than a month ago                     Adult Aquatic Therapy - 02/15/16 0906      Aquatic Therapy Subjective   Subjective Pt had no complaints        O: Pt entered/exited the pool via steps with single UE support on rail.  50 ft =1 lap  Exercises performed in 3'6" depth -  4'6" depth   4 laps - (1 lap equals left and right) Sidestepping with BUE on wall left/ right  Stretches : (Cuing provided for proper alignment)  ROM of each joint wall stretch  hip flexor stretch  Hip circles    2 laps- backwards with cue for shorter stride to decrease pain. BUE on noodles, emphasized PF and eccentric lowering of heel. Cued for propioception 2 laps- forward walking    Guided Body scan while pt practiced  relaxation on noodles under head, armpits, midback, posterior thighs pulled by PT across 2 laps.   Guided pt on transitioning to stand with minimal strain on neck and back.                  PT Long Term Goals -  02/01/16 1156      PT LONG TERM GOAL #1   Title Patient will increase BLE gross strength to 4+/5 as to improve functional strength for independent gait, increased standing tolerance and increased ADL ability.   Time 6   Period Weeks   Status On-going     PT LONG TERM GOAL #2   Title Patient will report a worst pain of 4/10 on VAS in    right hip         to improve tolerance with ADLs and reduced symptoms with activities.    Time 6   Period Weeks   Status On-going     PT LONG TERM GOAL #3   Title  Patient will reduce modified Oswestry score to <40 as to demonstrate minimal disability with ADLs including improved sleeping tolerance, walking/sitting tolerance etc for better mobility with ADLs.    Time 6   Period Weeks   Status On-going     PT LONG TERM GOAL #4   Title Patient will be independent in home exercise program to improve strength/mobility for better functional independence with ADLs.   Time 6   Period Weeks   Status  On-going     PT LONG TERM GOAL #5   Title Patient will increase 10 meter walk test to >1.4752m/s as to improve gait speed for better community ambulation and to reduce fall risk.   Time 6   Period Weeks   Status On-going     PT LONG TERM GOAL #6   Title Pt will demo no pelvic obliquities (equal alignment of PSIS ) across 2 visits in order to progress to hip extension exercises and improve gait   Time 12   Period Weeks   Status Achieved     PT LONG TERM GOAL #7   Title Pt will demo more forefoot weight bear with less hypextendsion of her knees in gait in order to show good carry over with training and to walk with improved mechanics and less pain   Time 12   Period Weeks   Status Achieved               Plan - 02/15/16 0908    Clinical Impression Statement Pt states her R hip pain has decreased in frequency and she no longer wakes up with pain. t tolerated today's aquatic session without complaints.  Abbreviated session to 33 min due to pt's first time to aquatic therapy and to avoid fatigue. Initiated mindfulness practice today into HEP and pt reported feeling less pain following practice. Pt continues to benefit from skilled PT.    Rehab Potential Fair   Clinical Impairments Affecting Rehab Potential positive: young in age, family support; negative: chronic pain, co-morbidities, smoker; Patient's clinical presentation is evolving due to increased LE pain which radiates down to knee;    PT Frequency 2x / week   PT Duration 6 weeks   PT Treatment/Interventions Aquatic Therapy;Cryotherapy;Gait training;Traction;Moist Heat;Stair training;Functional mobility training;Therapeutic activities;Therapeutic exercise;Balance training;Neuromuscular re-education;Manual techniques;Patient/family education   PT Next Visit Plan aquatic therapy- address RLE hip pain;   Consulted and Agree with Plan of Care Patient      Patient will benefit from skilled therapeutic intervention in order to improve  the following deficits and impairments:  Abnormal gait, Pain, Postural dysfunction, Decreased activity tolerance, Decreased range of motion, Decreased strength, Impaired flexibility, Decreased balance, Decreased safety awareness  Visit Diagnosis: Difficulty in walking, not elsewhere classified  Muscle weakness (generalized)  Chronic right-sided low back pain,  with sciatica presence unspecified     Problem List Patient Active Problem List   Diagnosis Date Noted  . Deep vein thrombosis (DVT) (HCC) 04/08/2015  . May-Thurner syndrome 04/08/2015  . Overdose 02/09/2015  . Ache in joint 11/21/2013  . Arteriovenous fistula (HCC) 06/20/2013  . Absolute anemia 10/19/2012  . Recurrent major depressive disorder, in partial remission (HCC) 07/13/2012  . Chronic pain 01/22/2012  . Clinical depression 01/22/2012  . Disease of lung 01/22/2012  . Fibromyalgia 01/22/2012  . LBP (low back pain) 01/22/2012  . Cannot sleep 01/22/2012  . Emotional neurotic disorder 01/22/2012  . Cannabis abuse, continuous 01/22/2012  . Pulmonary embolism with infarction (HCC) 01/22/2012  . Esophagitis, reflux 01/22/2012  . Current tobacco use 01/22/2012  . Narcotic drug use 11/02/2010  . Post-phlebitic syndrome 07/05/2010  . Borderline personality disorder 06/15/2010  . Chronic deep vein thrombosis (DVT) of lower extremity (HCC) 06/15/2010  . Hereditary and idiopathic neuropathy 06/15/2010  . Neurosis, posttraumatic 06/15/2010    Mariane Masters ,PT, DPT, E-RYT  02/15/2016, 9:08 AM  [facility] MAIN [facility] SERVICES [address] [place], [place], [address] Phone: [phone]   Fax:  [phone]  Name: Lindsey Willis MRN: [id number] Date of Birth: 12-08-1975

## 2016-02-24 ENCOUNTER — Ambulatory Visit: Payer: PPO | Admitting: Physical Therapy

## 2016-03-02 ENCOUNTER — Ambulatory Visit: Payer: PPO | Admitting: Physical Therapy

## 2016-03-02 DIAGNOSIS — R262 Difficulty in walking, not elsewhere classified: Secondary | ICD-10-CM | POA: Diagnosis not present

## 2016-03-02 DIAGNOSIS — M545 Low back pain: Secondary | ICD-10-CM

## 2016-03-02 DIAGNOSIS — M6281 Muscle weakness (generalized): Secondary | ICD-10-CM

## 2016-03-02 DIAGNOSIS — G8929 Other chronic pain: Secondary | ICD-10-CM

## 2016-03-03 NOTE — Therapy (Signed)
[place] MAIN [facility] SERVICES [address] [place], [place], [address] Phone: [phone]   Fax:  [phone]  Physical Therapy Treatment  Patient Details  Name: Lindsey Willis MRN: [id number] Date of Birth: [birth year] Referring Provider: Dr. Loanne Drilling  Encounter Date: 03/02/2016      PT End of Session - 03/03/16 1717    Visit Number 7   Number of Visits 12   Date for PT Re-Evaluation 05/15/16   Authorization Type gcode 7   PT Start Time 1030   PT Stop Time 1115   PT Time Calculation (min) 45 min   Activity Tolerance Patient tolerated treatment well;No increased pain   Behavior During Therapy WFL for tasks assessed/performed      Past Medical History:  Diagnosis Date  . Anemia   . Asthma   . Borderline personality disorder   . Depression   . DVT of leg (deep venous thrombosis) ([place])   . Fibromyalgia   . May-Thurner syndrome   . PTSD (post-traumatic stress disorder)     Past Surgical History:  Procedure Laterality Date  . BREAST CYST ASPIRATION Right    neg  . CHOLECYSTECTOMY    . IVC FILTER PLACEMENT ([facility] HX)    . LEG SURGERY    . TUBAL LIGATION      There were no vitals filed for this visit.      Subjective Assessment - 03/03/16 1716    Subjective Pt continues to report no radiating pain. Pt is able to decrease pain with HEP   Pertinent History Personal factors affecting rehab: May Thurner's, DVTs, Nerve damage in spine;    Limitations Standing;Walking   How long can you sit comfortably? 15 min   How long can you stand comfortably? 4-5 minutes; will force self to stand longer for household chores but will take frequent breaks;    How long can you walk comfortably? 300-500 feet max.   Diagnostic tests CAT scan Sept 2017- awaiting on results;    Patient Stated Goals reduce RLE hip pain; improve strength; be able to be more active with less discomfort;    Pain Onset More than a month ago                      Adult Aquatic Therapy - 03/03/16 1717      Aquatic Therapy Subjective   Subjective Pt had no complaints except for R hip ext during grapevine exercise          O: Pt entered/exited the pool via steps with single UE support on rail.  50 ft =1 lap  Exercises performed in 3'6" -4"   Stretches Added hipflexor stretch with simulated overhead reaching tandem stance  4 laps =79f side stepping with BUE (cued for decreased upper trap overuse)   4 laps = 25 ft each walking backwards with BUE on noodles, feet propioception   4 laps = grapevine (2 laps L, 2 laps R)   Stretches                PT Long Term Goals - 02/15/16 0912      PT LONG TERM GOAL #1   Title Patient will increase BLE gross strength to 4+/5 as to improve functional strength for independent gait, increased standing tolerance and increased ADL ability.   Time 6   Period Weeks   Status Partially Met     PT LONG TERM GOAL #2   Title Patient will  report a worst pain of 6/10 on VAS in    right hip occurs 50% of the week     to improve tolerance with ADLs and reduced symptoms with activities.  (02/15/15:     Time 6   Period Weeks   Status Achieved     PT LONG TERM GOAL #3   Title  Patient will reduce modified Oswestry score to <40 as to demonstrate minimal disability with ADLs including improved sleeping tolerance, walking/sitting tolerance etc for better mobility with ADLs.    Time 6   Period Weeks   Status Partially Met     PT LONG TERM GOAL #4   Title Patient will be independent in home exercise program to improve strength/mobility for better functional independence with ADLs.   Time 6   Period Weeks   Status Partially Met     PT LONG TERM GOAL #5   Title Patient will increase 10 meter walk test to >1.38ms as to improve gait speed for better community ambulation and to reduce fall risk.   Time 6   Period Weeks   Status On-going     Additional Long Term Goals    Additional Long Term Goals Yes     PT LONG TERM GOAL #6   Title Pt will demo no pelvic obliquities (equal alignment of PSIS ) across 2 visits in order to progress to hip extension exercises and improve gait   Time 12   Period Weeks   Status Achieved     PT LONG TERM GOAL #7   Title Pt will demo more forefoot weight bear with less hypextendsion of her knees in gait in order to show good carry over with training and to walk with improved mechanics and less pain   Time 12   Period Weeks   Status Achieved               Plan - 03/03/16 1718    Clinical Impression Statement Pt tolerated upgrade of aquatic Tx today with balance training but required modification to grapevine exercise. Pt required cuing with decreased  R hip ext due to R hip pain. Pt is showing increased endurance and good carry over with gait on land. Pt continues to benefit from skilled PT with a few more appts before d/c.    Rehab Potential Fair   Clinical Impairments Affecting Rehab Potential positive: young in age, family support; negative: chronic pain, co-morbidities, smoker; Patient's clinical presentation is evolving due to increased LE pain which radiates down to knee;    PT Frequency 2x / week   PT Duration 6 weeks   PT Treatment/Interventions Aquatic Therapy;Cryotherapy;Gait training;Traction;Moist Heat;Stair training;Functional mobility training;Therapeutic activities;Therapeutic exercise;Balance training;Neuromuscular re-education;Manual techniques;Patient/family education   PT Next Visit Plan aquatic therapy- address RLE hip pain;   Consulted and Agree with Plan of Care Patient      Patient will benefit from skilled therapeutic intervention in order to improve the following deficits and impairments:  Abnormal gait, Pain, Postural dysfunction, Decreased activity tolerance, Decreased range of motion, Decreased strength, Impaired flexibility, Decreased balance, Decreased safety awareness  Visit  Diagnosis: Difficulty in walking, not elsewhere classified  Muscle weakness (generalized)  Chronic right-sided low back pain, with sciatica presence unspecified     Problem List Patient Active Problem List   Diagnosis Date Noted  . Deep vein thrombosis (DVT) ([facility] 04/08/2015  . May-Thurner syndrome 04/08/2015  . Overdose 02/09/2015  . Ache in joint 11/21/2013  . Arteriovenous fistula ([facility] 06/20/2013  .  Absolute anemia 10/19/2012  . Recurrent major depressive disorder, in partial remission ([place]) 07/13/2012  . Chronic pain 01/22/2012  . Clinical depression 01/22/2012  . Disease of lung 01/22/2012  . Fibromyalgia 01/22/2012  . LBP (low back pain) 01/22/2012  . Cannot sleep 01/22/2012  . Emotional neurotic disorder 01/22/2012  . Cannabis abuse, continuous 01/22/2012  . Pulmonary embolism with infarction ([place]) 01/22/2012  . Esophagitis, reflux 01/22/2012  . Current tobacco use 01/22/2012  . Narcotic drug use 11/02/2010  . Post-phlebitic syndrome 07/05/2010  . Borderline personality disorder 06/15/2010  . Chronic deep vein thrombosis (DVT) of lower extremity ([place]) 06/15/2010  . Hereditary and idiopathic neuropathy 06/15/2010  . Neurosis, posttraumatic 06/15/2010    Jerl Mina ,PT, DPT, E-RYT  03/03/2016, 5:20 PM  [place] MAIN [facility] SERVICES [address] [place], [place], [address] Phone: [phone]   Fax:  [phone]  Name: CHATTIE GREESON MRN: [id number] Date of Birth: 07-Feb-1976

## 2016-03-08 DIAGNOSIS — G8929 Other chronic pain: Secondary | ICD-10-CM | POA: Diagnosis not present

## 2016-03-08 DIAGNOSIS — M5442 Lumbago with sciatica, left side: Secondary | ICD-10-CM | POA: Diagnosis not present

## 2016-03-08 DIAGNOSIS — M797 Fibromyalgia: Secondary | ICD-10-CM | POA: Diagnosis not present

## 2016-03-09 ENCOUNTER — Ambulatory Visit: Payer: PPO | Attending: Anesthesiology | Admitting: Physical Therapy

## 2016-03-09 DIAGNOSIS — M6281 Muscle weakness (generalized): Secondary | ICD-10-CM | POA: Insufficient documentation

## 2016-03-09 DIAGNOSIS — R262 Difficulty in walking, not elsewhere classified: Secondary | ICD-10-CM | POA: Diagnosis not present

## 2016-03-09 DIAGNOSIS — G8929 Other chronic pain: Secondary | ICD-10-CM | POA: Diagnosis not present

## 2016-03-09 DIAGNOSIS — M545 Low back pain: Secondary | ICD-10-CM | POA: Insufficient documentation

## 2016-03-09 NOTE — Therapy (Signed)
[place] [facility] MAIN [facility] SERVICES [address] [place], [place], [address] Phone: [phone]   Fax:  [phone]  Physical Therapy Treatment /Discharge Summary  Patient Details  Name: Lindsey Willis MRN: [id number] Date of Birth: 30-Mar-1975 Referring Provider: Dr. Consuela Mimes  Encounter Date: 03/09/2016      PT End of Session - 03/09/16 1140    Visit Number 8   Number of Visits 12   Date for PT Re-Evaluation 05/15/16   Authorization Type gcode 8   PT Start Time 1115   PT Stop Time 1200   PT Time Calculation (min) 45 min   Activity Tolerance Patient tolerated treatment well;No increased pain   Behavior During Therapy WFL for tasks assessed/performed      Past Medical History:  Diagnosis Date  . Anemia   . Asthma   . Borderline personality disorder   . Depression   . DVT of leg (deep venous thrombosis) ([facility])   . Fibromyalgia   . May-Thurner syndrome   . PTSD (post-traumatic stress disorder)     Past Surgical History:  Procedure Laterality Date  . BREAST CYST ASPIRATION Right    neg  . CHOLECYSTECTOMY    . IVC FILTER PLACEMENT ([facility] HX)    . LEG SURGERY    . TUBAL LIGATION      There were no vitals filed for this visit.      Subjective Assessment - 03/09/16 1118    Subjective Pt reports her RLE pain hurts but it is no way near where it was when she srtarted PT. Pt has more days when it hurts a little bit and not to the point to it is incapaciting. Pt has less bad days. Pt is only taking mm relaxants as needed.    Pertinent History Personal factors affecting rehab: May Thurner's, DVTs, Nerve damage in spine;    How long can you sit comfortably? 15 min   How long can you stand comfortably? 4-5 minutes; will force self to stand longer for household chores but will take frequent breaks;    How long can you walk comfortably? 300-500 feet max.   Diagnostic tests CAT scan Sept 2017- awaiting on results;    Patient Stated Goals  reduce RLE hip pain; improve strength; be able to be more active with less discomfort;                      Adult Aquatic Therapy - 03/09/16 1127      Aquatic Therapy Subjective   Subjective pt had no complaints         O: Pt entered/exited the pool via steps with single UE support on rail.  50 ft =1 lap  Exercises performed in 3'6" depth   Stretches : (Cuing provided for proper alignment) ROM of each joint wall stretch Mini squat-figure 4 (piriformis) hip flexor stretch on step hip flexor twist    4  laps walking backwards with BUE noodles  1/2 lap with segmental 180 deg turns to simulate turns on land  4 laps walking backwards, wider BOS in a zig zag pattern,  with slight trunk rotation for postural stability without BUE support on noodles   Relaxation  on noodles  5' (not charged)                  PT Long Term Goals - 03/09/16 1120      PT LONG TERM GOAL #1   Title Patient will  increase BLE gross strength to 4+/5 as to improve functional strength for independent gait, increased standing tolerance and increased ADL ability.   Status Achieved     PT LONG TERM GOAL #2   Title Patient will report a worst pain of 6/10 on VAS in    right hip occurs 50% of the week     to improve tolerance with ADLs and reduced symptoms with activities.  (02/15/15:     Status Achieved     PT LONG TERM GOAL #3   Title  Patient will reduce modified Oswestry score to <40 as to demonstrate minimal disability with ADLs including improved sleeping tolerance, walking/sitting tolerance etc for better mobility with ADLs. (2/1: 34%)     Time 6   Period Weeks   Status Achieved     PT LONG TERM GOAL #4   Title Patient will be independent in home exercise program to improve strength/mobility for better functional independence with ADLs.   Time 6   Period Weeks   Status Achieved     PT LONG TERM GOAL #5   Title Patient will increase 10 meter walk test to >1.9m/s as to  improve gait speed for better community ambulation and to reduce fall risk.   Time 6   Period Weeks   Status Achieved     PT LONG TERM GOAL #6   Title Pt will demo no pelvic obliquities (equal alignment of PSIS ) across 2 visits in order to progress to hip extension exercises and improve gait   Status Achieved     PT LONG TERM GOAL #7   Title Pt will demo more forefoot weight bear with less hypextendsion of her knees in gait in order to show good carry over with training and to walk with improved mechanics and less pain   Time 12   Period Weeks   Status Achieved               Plan - 03-19-2016 1141    Clinical Impression Statement Pt has achieved 100% of her goals . Pt reports her LBP/R hip/LE pain has improved "A Great Deal Better" according to the [facility] scale. Pt demo'd decrease score on her MODI questionnaire from 40% to 34%. Pt also demo'd improve gait, strength, no more pelvic obliquities, less spinal mm imbalance,  and increased hip ROM.  Pt is ready for d/c. Discussed continuation of HEP and joining a local pool.    Rehab Potential Fair   Clinical Impairments Affecting Rehab Potential positive: young in age, family support; negative: chronic pain, co-morbidities, smoker; Patient's clinical presentation is evolving due to increased LE pain which radiates down to knee;    PT Frequency 2x / week   PT Duration 6 weeks   PT Treatment/Interventions Aquatic Therapy;Cryotherapy;Gait training;Traction;Moist Heat;Stair training;Functional mobility training;Therapeutic activities;Therapeutic exercise;Balance training;Neuromuscular re-education;Manual techniques;Patient/family education   PT Next Visit Plan aquatic therapy- address RLE hip pain;   Consulted and Agree with Plan of Care Patient      Patient will benefit from skilled therapeutic intervention in order to improve the following deficits and impairments:  Abnormal gait, Pain, Postural dysfunction, Decreased activity tolerance,  Decreased range of motion, Decreased strength, Impaired flexibility, Decreased balance, Decreased safety awareness  Visit Diagnosis: Difficulty in walking, not elsewhere classified  Muscle weakness (generalized)  Chronic right-sided low back pain, with sciatica presence unspecified       G-Codes - 2016-03-19 1145    Functional Assessment Tool Used 10 meter walk, modified oswestry, clinical  judgement   Functional Limitation Mobility: Walking and moving around   Mobility: Walking and Moving Around Current Status [phone]([id number]) At least 20 percent but less than 40 percent impaired, limited or restricted   Mobility: Walking and Moving Around Goal Status [phone]([id number]) At least 20 percent but less than 40 percent impaired, limited or restricted   Mobility: Walking and Moving Around Discharge Status [phone]([id number]) At least 20 percent but less than 40 percent impaired, limited or restricted      Problem List Patient Active Problem List   Diagnosis Date Noted  . Deep vein thrombosis (DVT) ([facility]) 04/08/2015  . May-Thurner syndrome 04/08/2015  . Overdose 02/09/2015  . Ache in joint 11/21/2013  . Arteriovenous fistula ([facility]) 06/20/2013  . Absolute anemia 10/19/2012  . Recurrent major depressive disorder, in partial remission ([facility]) 07/13/2012  . Chronic pain 01/22/2012  . Clinical depression 01/22/2012  . Disease of lung 01/22/2012  . Fibromyalgia 01/22/2012  . LBP (low back pain) 01/22/2012  . Cannot sleep 01/22/2012  . Emotional neurotic disorder 01/22/2012  . Cannabis abuse, continuous 01/22/2012  . Pulmonary embolism with infarction ([facility]) 01/22/2012  . Esophagitis, reflux 01/22/2012  . Current tobacco use 01/22/2012  . Narcotic drug use 11/02/2010  . Post-phlebitic syndrome 07/05/2010  . Borderline personality disorder 06/15/2010  . Chronic deep vein thrombosis (DVT) of lower extremity ([facility]) 06/15/2010  . Hereditary and idiopathic neuropathy 06/15/2010  . Neurosis, posttraumatic 06/15/2010     Mariane MastersYeung,Shin Yiing ,PT, DPT, E-RYT  03/09/2016, 11:46 AM  [place] [facility] [address] [place], [place], [id number] Phone: [phone]   Fax:  [phone]  Name: Lindsey Willis MRN: [id number] Date of Birth: 11/29/1975

## 2016-06-27 DIAGNOSIS — M25561 Pain in right knee: Secondary | ICD-10-CM | POA: Diagnosis not present

## 2016-06-27 DIAGNOSIS — M5442 Lumbago with sciatica, left side: Secondary | ICD-10-CM | POA: Diagnosis not present

## 2016-06-27 DIAGNOSIS — G8929 Other chronic pain: Secondary | ICD-10-CM | POA: Diagnosis not present

## 2016-06-27 DIAGNOSIS — J4540 Moderate persistent asthma, uncomplicated: Secondary | ICD-10-CM | POA: Diagnosis not present

## 2016-06-27 DIAGNOSIS — M797 Fibromyalgia: Secondary | ICD-10-CM | POA: Diagnosis not present

## 2016-06-27 DIAGNOSIS — G2581 Restless legs syndrome: Secondary | ICD-10-CM | POA: Diagnosis not present

## 2016-06-27 DIAGNOSIS — Z114 Encounter for screening for human immunodeficiency virus [HIV]: Secondary | ICD-10-CM | POA: Diagnosis not present

## 2016-06-27 DIAGNOSIS — N949 Unspecified condition associated with female genital organs and menstrual cycle: Secondary | ICD-10-CM | POA: Diagnosis not present

## 2016-06-27 DIAGNOSIS — I871 Compression of vein: Secondary | ICD-10-CM | POA: Diagnosis not present

## 2016-06-27 DIAGNOSIS — F331 Major depressive disorder, recurrent, moderate: Secondary | ICD-10-CM | POA: Diagnosis not present

## 2016-06-27 DIAGNOSIS — G609 Hereditary and idiopathic neuropathy, unspecified: Secondary | ICD-10-CM | POA: Diagnosis not present

## 2016-06-27 DIAGNOSIS — N760 Acute vaginitis: Secondary | ICD-10-CM | POA: Diagnosis not present

## 2016-10-04 DIAGNOSIS — Z23 Encounter for immunization: Secondary | ICD-10-CM | POA: Diagnosis not present

## 2016-10-04 DIAGNOSIS — N760 Acute vaginitis: Secondary | ICD-10-CM | POA: Diagnosis not present

## 2016-10-04 DIAGNOSIS — B9689 Other specified bacterial agents as the cause of diseases classified elsewhere: Secondary | ICD-10-CM | POA: Diagnosis not present

## 2016-10-04 DIAGNOSIS — N898 Other specified noninflammatory disorders of vagina: Secondary | ICD-10-CM | POA: Diagnosis not present

## 2016-10-04 DIAGNOSIS — Z114 Encounter for screening for human immunodeficiency virus [HIV]: Secondary | ICD-10-CM | POA: Diagnosis not present

## 2016-10-27 DIAGNOSIS — F419 Anxiety disorder, unspecified: Secondary | ICD-10-CM | POA: Diagnosis not present

## 2016-10-27 DIAGNOSIS — F331 Major depressive disorder, recurrent, moderate: Secondary | ICD-10-CM | POA: Diagnosis not present

## 2016-11-03 DIAGNOSIS — G609 Hereditary and idiopathic neuropathy, unspecified: Secondary | ICD-10-CM | POA: Diagnosis not present

## 2016-11-03 DIAGNOSIS — G8929 Other chronic pain: Secondary | ICD-10-CM | POA: Diagnosis not present

## 2016-11-03 DIAGNOSIS — M797 Fibromyalgia: Secondary | ICD-10-CM | POA: Diagnosis not present

## 2016-11-03 DIAGNOSIS — M5442 Lumbago with sciatica, left side: Secondary | ICD-10-CM | POA: Diagnosis not present

## 2016-11-07 DIAGNOSIS — G8929 Other chronic pain: Secondary | ICD-10-CM | POA: Diagnosis not present

## 2016-11-07 DIAGNOSIS — E669 Obesity, unspecified: Secondary | ICD-10-CM | POA: Diagnosis not present

## 2016-11-07 DIAGNOSIS — M25561 Pain in right knee: Secondary | ICD-10-CM | POA: Diagnosis not present

## 2016-11-07 DIAGNOSIS — M222X1 Patellofemoral disorders, right knee: Secondary | ICD-10-CM | POA: Diagnosis not present

## 2016-11-15 ENCOUNTER — Encounter: Payer: Self-pay | Admitting: Physical Therapy

## 2016-11-15 ENCOUNTER — Ambulatory Visit: Payer: PPO | Attending: Pain Medicine | Admitting: Physical Therapy

## 2016-11-15 DIAGNOSIS — G8929 Other chronic pain: Secondary | ICD-10-CM | POA: Insufficient documentation

## 2016-11-15 DIAGNOSIS — M6281 Muscle weakness (generalized): Secondary | ICD-10-CM

## 2016-11-15 DIAGNOSIS — M545 Low back pain: Secondary | ICD-10-CM | POA: Insufficient documentation

## 2016-11-15 DIAGNOSIS — R262 Difficulty in walking, not elsewhere classified: Secondary | ICD-10-CM | POA: Insufficient documentation

## 2016-11-15 NOTE — Therapy (Signed)
[facility] MAIN [facility] SERVICES [address] [place], [place], [address] Phone: [phone]   Fax:  [phone]  Physical Therapy Evaluation  Patient Details  Name: Lindsey Willis MRN: [id number] Date of Birth: January 24, 1976 Referring Provider: Lindwood Qua  Encounter Date: 11/15/2016      PT End of Session - 11/15/16 1747    Visit Number 1   Number of Visits 17   Date for PT Re-Evaluation 01/10/17   Authorization Type 1/10 g codes   PT Start Time 0448   PT Stop Time 0540   PT Time Calculation (min) 52 min   Activity Tolerance Patient tolerated treatment well;Patient limited by pain   Behavior During Therapy [facility] for tasks assessed/performed  pt limited by pain      Past Medical History:  Diagnosis Date  . Anemia   . Asthma   . Borderline personality disorder (HCC)   . Depression   . DVT of leg (deep venous thrombosis) (HCC)   . Fibromyalgia   . May-Thurner syndrome   . PTSD (post-traumatic stress disorder)     Past Surgical History:  Procedure Laterality Date  . BREAST CYST ASPIRATION Right    neg  . CHOLECYSTECTOMY    . IVC FILTER PLACEMENT ([facility] HX)    . LEG SURGERY    . TUBAL LIGATION      There were no vitals filed for this visit.       Subjective Assessment - 11/15/16 1655    Subjective Pt states that she has intermittent-constant pain in her low back R>L with pain normally ranging around 6/10 on NPS.  Pt states that it is worse in the morning and tends to get a little better with movement.  Standing, walking, sitting for extended periods of time causes an increase in pain and stiffness.  Pt states that she has ocasional numbness and tingling going down the lateral aspect of her legs.  Pt has condition that causes reduced circulation in B LE's causing numbness at times.  Pt has hx of DVT's in B LE's.   Pertinent History Pt has had chronic low back pain for 10+ years.  Pt has hx of anxiety, asthma, chronic LBP,  major depressive disorder, fibromyalgia, May-Thurner Syndrome, RLS and PTSD.  Pt has had a hx of LE DVT's and pulmonary embolism with multiple stene placements and thrombolysis in B LE's with last stent placement occuring in 2012.  Pt is currently on anticoagulants. Pt has had 2 nerve blocks for leg pain in the past resulting in decreased sensation and function of L LE.  Pt also has decreased sensation of B feet.   Limitations Sitting;Walking;Writing;Lifting;House hold activities;Standing   How long can you sit comfortably? 5-10 minutes    How long can you stand comfortably? <5 minutes    How long can you walk comfortably? 5-10 minutes   Diagnostic tests none   Patient Stated Goals "reduce overall pain"   Currently in Pain? Yes   Pain Score 6    Pain Location Back   Pain Orientation Lower;Right;Left   Pain Descriptors / Indicators Sharp;Shooting;Spasm   Pain Type Chronic pain   Pain Radiating Towards Down through R hip   Pain Onset More than a month ago   Pain Frequency Intermittent   Aggravating Factors  standing, sitting, walking, daily activities   Pain Relieving Factors rest, medication   Effect of Pain on Daily Activities reduction of activities    Multiple Pain Sites No  PAIN: 6/10 6/10: best pain 6/10, worst 9/10 After session pt reported improved pain at 5/10  POSTURE:  Fwd head when sitting and standing, increased lumbar curve  PROM/AROM: Lumbar ROM: Ext <5 deg - increased pain  Flexion to medial joint of knees - increase pain after 2 reps of repeated movements Sidebending: mid thigh left, mid thigh to right - increased pain with movement Rotation: 10 degrees to right and left   Thoracic ROM: Ext, rotation - limited both directions   STRENGTH:  Graded on a 0-5 scale Muscle Group Left Right  Hip Flex 2/5 3+/5  Hip Abd 3+/5 3+/5  Hip Add 3-/5 3-/5  Hip Ext 2+/5 2/5  Knee Flex 3/5 5/5  Knee Ext 3/5 5/5  Ankle DF 5/5 5/5  Ankle PF    L ROM potentially  effected by L nerve damage from nerve blocks in past   SENSATION: Decreased sensation across all dermatomes of L LE except S2 dermatome  Palpation: Increased tenderness to palpation noted along B lumbar paraspinals, with severe tenderness to light touch along R aspect of lumbar spine. Pt has increased tenderness of B gluteals and piriformis with R>L.  Pt had increased pain and discomfort to light palpation along lumbar spine and gluteals - with increased symptoms noted during exam. Pt has hypomobility, increased pain, and increased tenderness with CPA at grade 1 mobilization along all lumbar vertebra - no symptoms are discomfort indicated beginning at T12 level.  SPECIAL TESTS: No special tests performed secondary to pt not be able to get into positions secondary to increased pain  FUNCTIONAL MOBILITY: Decreased speed when changing positions, transferring, rolling, etc. Secondary to heightened levels of pain in lumbar spine and R hip   GAIT:  Pt ambulates with SPC; decreased trunk rotation with ambulation, decreased arm swing, decrease velocity - increased pain when ambulating for increased distances  OUTCOME MEASURES: TEST Outcome Interpretation  5 times sit<>stand 50.38sec >88 yo, >15 sec indicates increased risk for falls  10 meter walk test             0.5    m/s <1.0 m/s indicates increased risk for falls; limited community ambulator  Outcome measures performed using SPC  Mod ODI: 72% indicating Extreme Disability   Treatment:  There Ex: Abdominal bracing in hooklying position x 10 reps with 10 sec hold - pt performed exercises while lying on moist hot pack   Manual: STM to lumbar paraspinals and B gluteals/piriformis with increased pain and tenderness noted with light to mod touch.  Roller to lumbar paraspinals and B   gluteals and piriformis with very minimal pressure secondary to increased pain with palpation.  Guarding and pain slightly reduced after intervention.       [facility]  PT Assessment - 11/15/16 1648      Assessment   Medical Diagnosis Low back pain   Referring Provider CARTER, JESSICA ELYSE   Onset Date/Surgical Date 02/06/09   Hand Dominance Right   Next MD Visit 01/2017   Prior Therapy yes     Precautions   Precautions None     Restrictions   Weight Bearing Restrictions No     Balance Screen   Has the patient fallen in the past 6 months No   Has the patient had a decrease in activity level because of a fear of falling?  Yes   Is the patient reluctant to leave their home because of a fear of falling?  No     Home Environment  Living Environment Private residence   Living Arrangements Alone   Available Help at Discharge Family;Friend(s)   Type of Home Apartment   Home Access Stairs to enter   Entrance Stairs-Number of Steps 6   Entrance Stairs-Rails Can reach both   Home Layout One level   Home Equipment [place] - single point;Shower seat     Prior Function   Level of Independence Independent   Vocation On disability            Objective measurements completed on examination: See above findings.                  PT Education - 11/15/16 1748    Education provided Yes   Education Details PT plan of care, HEP, pain science   Person(s) Educated Patient   Methods Explanation;Demonstration;Verbal cues;Handout   Comprehension Verbalized understanding          PT Short Term Goals - 11/16/16 0805      PT SHORT TERM GOAL #1   Title Pt will demonstrate improved pain levels with activty to 6/10 or <.   Baseline Worst: 9/10, Best: 6/10   Time 4   Period Weeks   Status New   Target Date 12/14/16     PT SHORT TERM GOAL #2   Title Pt will improve Mod ODI to 60% or less indicating severe disability in order to demonstrate self-reported improvements of pain and mobility.   Baseline 11/15/16: 72% indicating extreme disability   Time 8   Period Weeks   Status New   Target Date 12/14/16           PT Long Term  Goals - 11/16/16 0807      PT LONG TERM GOAL #1   Title Pt will demonstrate independence with HEP in order to manage pain and other symptoms.   Time 8   Period Weeks   Status New   Target Date 01/11/17     PT LONG TERM GOAL #2   Title Pt will improve gait speed to at least 1.0 m/s in order to demonstrate improvements in LE strength and decreaed pain levels in order to ambulate within limited community ambulator status.   Baseline 11/15/16: 0.5 m/s   Time 8   Period Weeks   Status New   Target Date 01/11/17     PT LONG TERM GOAL #3   Title Pt will reduce time to complete 5x sit to stand to <30 seconds in order to demonstrate improved LE strength and decreaed levels of pain, demonstrating improvement in functional mobility.   Baseline 11/15/16: 50.38 sec   Time 8   Period Weeks   Status New   Target Date 01/11/17     PT LONG TERM GOAL #4   Title Pt will improve Mod ODI to 40% or < indicating moderate level of disability in order to demonstrate self-reported improvements in pain and mobility.   Baseline 11/15/16: 72% indicating extreme disability   Time 8   Period Weeks   Status New   Target Date 01/11/17     PT LONG TERM GOAL #5   Title Pt will improve LE strength to at least 4/5 in all limited planes in order to improve mobility, gait, and ADL ability.   Baseline Gross strength assessment: 3/5 - 2/5 in L hip blex and B hip ext   Time 8   Period Weeks   Status New   Target Date 01/11/17  Plan - 12-11-2016 0750    Clinical Impression Statement Pt is a 41 y/o female who presents with constant low back pain, with pain levels varying based on activity.  Pt's pain is located in the lumbar spine with R > L, with pain levels ranging from 6/10 to 9/10 depending on level of activity or type of movement.  Pt has a Mod ODI score of 72%, indicating extreme disability.  Pt has decreased lumbar ROM secondary to pain with movements, as well as decreased core stability  and strength.  Pt also presents with decreased strength in B LE's and altered sensation with hyposensitvity noted in the LE across all dermatomes except S2.  Pt experiences increased pain and tenderness with palpation across lumbar paraspinals and B gluteals/piriformis, with tenderness greater on R compared to L.  Pt has increased tenderness to CPA across all lumbar vertebrae with grade 1 mobilizations.  Pt ambulates with SPC, demonstrating decreased gait speed, antalgic pattern with decreased flexion of hips and knees, decreased trunk swing and arm swing.  Pt has increased risk for falls as noted with decreased gait speed and 5x sit to stand scores.  Pt would benefit from skilled therapy services in order to decrease overall levels of pain, improve core strength and stability, improve LE strength, improve gait pattern and gait speed in order to improve functional mobility and quality of life.    History and Personal Factors relevant to plan of care: changing levels of pain, MDD, anxiety, PTSD, Chronic pain, May-thurner syndrome, hx of DVT's and pulmonary embolism   Clinical Presentation Unstable   Clinical Presentation due to: changing levels of pain, chronic pain, hx of DVT's and pulmonary embolism with multiple stent placements, May-thurner syndrome   Clinical Decision Making High   Rehab Potential Fair   Clinical Impairments Affecting Rehab Potential age, perception of dx, comorbidities   PT Frequency 2x / week   PT Duration 8 weeks   PT Treatment/Interventions ADLs/Self Care Home Management;Aquatic Therapy;Cryotherapy;Moist Heat;Traction;Ultrasound;Electrical Stimulation;Gait training;Stair training;Functional mobility training;Therapeutic activities;Therapeutic exercise;Balance training;Neuromuscular re-education;Patient/family education;Manual techniques;Passive range of motion;Dry needling;Taping   PT Next Visit Plan begin aquatic therapy   PT Home Exercise Plan abdominal bracing in hooklying     Consulted and Agree with Plan of Care Patient      Patient will benefit from skilled therapeutic intervention in order to improve the following deficits and impairments:  Abnormal gait, Decreased activity tolerance, Decreased endurance, Decreased mobility, Decreased range of motion, Decreased strength, Difficulty walking, Hypomobility, Increased muscle spasms, Pain  Visit Diagnosis: Chronic midline low back pain, with sciatica presence unspecified  Muscle weakness (generalized)  Difficulty in walking, not elsewhere classified      G-Codes - 12/11/16 1108    Functional Assessment Tool Used (Outpatient Only) 10 MW, ODI, 5 x sit to stand   Functional Limitation Mobility: Walking and moving around   Mobility: Walking and Moving Around Current Status [phone]) At least 60 percent but less than 80 percent impaired, limited or restricted   Mobility: Walking and Moving Around Goal Status [phone]) At least 40 percent but less than 60 percent impaired, limited or restricted       Problem List Patient Active Problem List   Diagnosis Date Noted  . Deep vein thrombosis (DVT) (HCC) 04/08/2015  . May-Thurner syndrome 04/08/2015  . Overdose 02/09/2015  . Ache in joint 11/21/2013  . Arteriovenous fistula (HCC) 06/20/2013  . Absolute anemia 10/19/2012  . Recurrent major depressive disorder, in partial remission (HCC) 07/13/2012  . Chronic  pain 01/22/2012  . Clinical depression 01/22/2012  . Disease of lung 01/22/2012  . Fibromyalgia 01/22/2012  . LBP (low back pain) 01/22/2012  . Cannot sleep 01/22/2012  . Emotional neurotic disorder 01/22/2012  . Cannabis abuse, continuous 01/22/2012  . Pulmonary embolism with infarction (HCC) 01/22/2012  . Esophagitis, reflux 01/22/2012  . Current tobacco use 01/22/2012  . Narcotic drug use 11/02/2010  . Post-phlebitic syndrome 07/05/2010  . Borderline personality disorder (HCC) 06/15/2010  . Chronic deep vein thrombosis (DVT) of lower extremity (HCC)  06/15/2010  . Hereditary and idiopathic neuropathy 06/15/2010  . Neurosis, posttraumatic 06/15/2010   This entire session was performed under direct supervision and direction of a licensed therapist/therapist assistant . I have personally read, edited and approve of the note as written. Stacey Drain, SPT Ezekiel Ina, PT, DPT  11/16/2016, 11:10 AM   [facility] MAIN [facility] SERVICES [address] [place], [place], [address] Phone: [phone]   Fax:  [phone]  Name: KELIN NIXON MRN: [id number] Date of Birth: 02-22-1975

## 2016-11-15 NOTE — Patient Instructions (Signed)
(  Home) Flexion: Pelvic Tilt    Lie with neck supported, knees bent, feet flat. Tighten and suck stomach in, pushing back down against surface. Do not push down with legs. Repeat 10 times per set. Do 1 sets per session. Do at least 3 times per day, can do at least 1 set per hour Copyright  VHI. All rights reserved.

## 2016-11-21 ENCOUNTER — Ambulatory Visit: Payer: PPO

## 2016-11-21 DIAGNOSIS — R262 Difficulty in walking, not elsewhere classified: Secondary | ICD-10-CM

## 2016-11-21 DIAGNOSIS — M545 Low back pain: Secondary | ICD-10-CM | POA: Diagnosis not present

## 2016-11-21 DIAGNOSIS — G8929 Other chronic pain: Secondary | ICD-10-CM

## 2016-11-21 DIAGNOSIS — M6281 Muscle weakness (generalized): Secondary | ICD-10-CM

## 2016-11-21 NOTE — Therapy (Signed)
Pt enters/exits pool via steps Participates in the following  Ambulation - 4 L fwd, arms on kick boards, slow/fluid stride - 2 L side arms on kick boards, slow/fluid stride (small)  LE - Minisquats - bicycle at bench with abd stab, 3 min - attempted scissor, unable due to pain  Noodle hang, 5 min  Hip exercises with abdominal stab - flex to ext neutral, B 20x  Stretching, B 3 x 15 sec each - SKTC, piriformis at bench and at wall - Hamstrings at step - gastroc/soleus at step - QL on R;  Instructed all stretches for home as well

## 2016-11-28 ENCOUNTER — Ambulatory Visit: Payer: PPO

## 2016-11-28 DIAGNOSIS — M545 Low back pain: Principal | ICD-10-CM

## 2016-11-28 DIAGNOSIS — R262 Difficulty in walking, not elsewhere classified: Secondary | ICD-10-CM

## 2016-11-28 DIAGNOSIS — G8929 Other chronic pain: Secondary | ICD-10-CM

## 2016-11-28 DIAGNOSIS — M6281 Muscle weakness (generalized): Secondary | ICD-10-CM

## 2016-11-28 NOTE — Therapy (Signed)
Pt enters/exits pool via steps Participates in the following  Ambulation - 4 L fwd, slow with kick boards - 6 L side step slow with kick boards  Core stabilization with UEs, 20x ea slow (straight legs vs wall sit) - flex/ext - abd/add - horizontal abd/add  Stretching, 4 x 15 seconds each, B - SKTC - piriformis - hamstrings - gastocs - QL

## 2016-12-05 ENCOUNTER — Ambulatory Visit: Payer: PPO

## 2016-12-05 DIAGNOSIS — G8929 Other chronic pain: Secondary | ICD-10-CM

## 2016-12-05 DIAGNOSIS — M6281 Muscle weakness (generalized): Secondary | ICD-10-CM

## 2016-12-05 DIAGNOSIS — R262 Difficulty in walking, not elsewhere classified: Secondary | ICD-10-CM

## 2016-12-05 DIAGNOSIS — M545 Low back pain: Secondary | ICD-10-CM | POA: Diagnosis not present

## 2016-12-05 NOTE — Therapy (Signed)
[facility] MAIN [facility] SERVICES [address] [place], [place], [address] Phone: [phone]   Fax:  [phone]  Physical Therapy Treatment  Patient Details  Name: Lindsey Willis MRN: [id number] Date of Birth: 07/16/1975 Referring Provider: Lindwood QuaARTER, JESSICA ELYSE  Encounter Date: 12/05/2016      PT End of Session - 12/05/16 1337    Visit Number 4   Number of Visits 17   Date for PT Re-Evaluation 01/10/17   Authorization Type 1/10 g codes   PT Start Time 0900   PT Stop Time 0945   PT Time Calculation (min) 45 min   Activity Tolerance Patient limited by pain      Past Medical History:  Diagnosis Date  . Anemia   . Asthma   . Borderline personality disorder (HCC)   . Depression   . DVT of leg (deep venous thrombosis) (HCC)   . Fibromyalgia   . May-Thurner syndrome   . PTSD (post-traumatic stress disorder)     Past Surgical History:  Procedure Laterality Date  . BREAST CYST ASPIRATION Right    neg  . CHOLECYSTECTOMY    . IVC FILTER PLACEMENT ([facility] HX)    . LEG SURGERY    . TUBAL LIGATION      There were no vitals filed for this visit.      Subjective Assessment - 12/05/16 1334    Subjective Pt reports increased soreness post last session for 3 days before beginning to ease. Currently 7-8/10 pain in R hip/low back. "Pain feels deep" Pain is aggravated by very minimal movement.   Pertinent History Pt has had chronic low back pain for 10+ years.  Pt has hx of anxiety, asthma, chronic LBP, major depressive disorder, fibromyalgia, May-Thurner Syndrome, RLS and PTSD.  Pt has had a hx of LE DVT's and pulmonary embolism with multiple stene placements and thrombolysis in B LE's with last stent placement occuring in 2012.  Pt is currently on anticoagulants. Pt has had 2 nerve blocks for leg pain in the past resulting in decreased sensation and function of L LE.  Pt also has decreased sensation of B feet.     Ambulation, very slow, UE  supported by kick boards - 4 L fwd - 4 L side  Supine float with noodles with relaxation techniques  Supine float with noodles with attempted stretching - gentle, passive stretching of B flanks, quads, gastrocs  Unable to tolerate stretching except calves, which range is only to neutral                             PT Education - 12/05/16 1336    Education provided Yes   Education Details Re educated on slow movement with ambulation through water for slow/fluid movement and allowing increased step length if possible. Also educated on relaxation technique in the water.   Person(s) Educated Patient   Methods Explanation;Verbal cues;Tactile cues          PT Short Term Goals - 12/05/16 1345      PT SHORT TERM GOAL #1   Title Pt will demonstrate improved pain levels with activty to 6/10 or <.   Baseline Worst: 9/10, Best: 6/10   Time 4   Period Weeks   Status New     PT SHORT TERM GOAL #2   Title Pt will improve Mod ODI to 60% or less indicating severe disability in order to demonstrate self-reported improvements  of pain and mobility.   Baseline 11/15/16: 72% indicating extreme disability   Time 8   Period Weeks   Status New           PT Long Term Goals - 11/28/16 1323      PT LONG TERM GOAL #1   Title Pt will demonstrate independence with HEP in order to manage pain and other symptoms.   Time 8   Period Weeks   Status New     PT LONG TERM GOAL #2   Title Pt will improve gait speed to at least 1.0 m/s in order to demonstrate improvements in LE strength and decreaed pain levels in order to ambulate within limited community ambulator status.   Baseline 11/15/16: 0.5 m/s   Time 8   Period Weeks   Status New     PT LONG TERM GOAL #3   Title Pt will reduce time to complete 5x sit to stand to <30 seconds in order to demonstrate improved LE strength and decreaed levels of pain, demonstrating improvement in functional mobility.   Baseline  11/15/16: 50.38 sec   Time 8   Period Weeks   Status New     PT LONG TERM GOAL #4   Title Pt will improve Mod ODI to 40% or < indicating moderate level of disability in order to demonstrate self-reported improvements in pain and mobility.   Baseline 11/15/16: 72% indicating extreme disability   Time 8   Period Weeks   Status New     PT LONG TERM GOAL #5   Title Pt will improve LE strength to at least 4/5 in all limited planes in order to improve mobility, gait, and ADL ability.   Baseline Gross strength assessment: 3/5 - 2/5 in L hip blex and B hip ext   Time 8   Period Weeks   Status New               Plan - 12/05/16 1338    Clinical Impression Statement Pt improved with slowing gait, but continues to experience pain deep in R hip with initial weigth bearing (stance phase) even in chest deep (non weight bearing environment). Decreasing step length helps a little. Pain also experienced with abduction stepping to the right, again minimized with small step lengths, but not relieved. Pt unable to tolerate any stretching in supine supported floating position. Overall, pt pain has increased over the past several months and aquatic therapy environment has not helped this time. Pt unable to tolerate minimal aquatic activity. Discussed last session with pt of requesting an x ray of the R hip through primary care doctore. Pt states she has a call in; advised pt message MD through my chart. Will discuss with primary therapist of holding further aquatic session until outcome of requesting an x ray.    Clinical Presentation Unstable      Patient will benefit from skilled therapeutic intervention in order to improve the following deficits and impairments:     Visit Diagnosis: Chronic midline low back pain, with sciatica presence unspecified  Muscle weakness (generalized)  Difficulty in walking, not elsewhere classified  Chronic right-sided low back pain, with sciatica presence  unspecified     Problem List Patient Active Problem List   Diagnosis Date Noted  . Deep vein thrombosis (DVT) (HCC) 04/08/2015  . May-Thurner syndrome 04/08/2015  . Overdose 02/09/2015  . Ache in joint 11/21/2013  . Arteriovenous fistula (HCC) 06/20/2013  . Absolute anemia 10/19/2012  . Recurrent major  depressive disorder, in partial remission (HCC) 07/13/2012  . Chronic pain 01/22/2012  . Clinical depression 01/22/2012  . Disease of lung 01/22/2012  . Fibromyalgia 01/22/2012  . LBP (low back pain) 01/22/2012  . Cannot sleep 01/22/2012  . Emotional neurotic disorder 01/22/2012  . Cannabis abuse, continuous 01/22/2012  . Pulmonary embolism with infarction (HCC) 01/22/2012  . Esophagitis, reflux 01/22/2012  . Current tobacco use 01/22/2012  . Narcotic drug use 11/02/2010  . Post-phlebitic syndrome 07/05/2010  . Borderline personality disorder (HCC) 06/15/2010  . Chronic deep vein thrombosis (DVT) of lower extremity (HCC) 06/15/2010  . Hereditary and idiopathic neuropathy 06/15/2010  . Neurosis, posttraumatic 06/15/2010    Scot Dock 12/05/2016, 1:46 PM  [facility] MAIN [facility] SERVICES [address] [place], [place], [address] Phone: [phone]   Fax:  [phone]  Name: Lindsey Willis MRN: [id number] Date of Birth: 09-05-1975

## 2016-12-12 ENCOUNTER — Ambulatory Visit: Payer: PPO | Attending: Pain Medicine | Admitting: Physical Therapy

## 2016-12-12 DIAGNOSIS — G8929 Other chronic pain: Secondary | ICD-10-CM | POA: Insufficient documentation

## 2016-12-12 DIAGNOSIS — R262 Difficulty in walking, not elsewhere classified: Secondary | ICD-10-CM | POA: Diagnosis not present

## 2016-12-12 DIAGNOSIS — M6281 Muscle weakness (generalized): Secondary | ICD-10-CM | POA: Insufficient documentation

## 2016-12-12 DIAGNOSIS — M545 Low back pain: Secondary | ICD-10-CM | POA: Insufficient documentation

## 2016-12-12 NOTE — Therapy (Signed)
[place] [facility] [address] [place], [place], [address] Phone: [phone]   Fax:  [phone]  Physical Therapy Treatment  Patient Details  Name: Lindsey GaudyWendy M Willis MRN: [id number] Date of Birth: 04/11/1975 Referring Provider: [place]wood QuaARTER, JESSICA ELYSE   Encounter Date: 12/12/2016  PT End of Session - 12/12/16 1421    Visit Number  5    Number of Visits  17    PT Start Time  0900    PT Stop Time  0945    PT Time Calculation (min)  45 min    Activity Tolerance  Patient limited by pain    Behavior During Therapy  [facility] for tasks assessed/performed       Past Medical History:  Diagnosis Date  . Anemia   . Asthma   . Borderline personality disorder (HCC)   . Depression   . DVT of leg (deep venous thrombosis) (HCC)   . Fibromyalgia   . May-Thurner syndrome   . PTSD (post-traumatic stress disorder)     Past Surgical History:  Procedure Laterality Date  . BREAST CYST ASPIRATION Right    neg  . CHOLECYSTECTOMY    . IVC FILTER PLACEMENT ([facility] HX)    . LEG SURGERY    . TUBAL LIGATION      There were no vitals filed for this visit.  Subjective Assessment - 12/12/16 1417    Subjective  PT reported continued pain after last aquatic therapy session x 3 days before returning to baseline.  Arrives in 8/10 pain.    Pertinent History  Pt has had chronic low back pain for 10+ years.  Pt has hx of anxiety, asthma, chronic LBP, major depressive disorder, fibromyalgia, May-Thurner Syndrome, RLS and PTSD.  Pt has had a hx of LE DVT's and pulmonary embolism with multiple stene placements and thrombolysis in B LE's with last stent placement occuring in 2012.  Pt is currently on anticoagulants. Pt has had 2 nerve blocks for leg pain in the past resulting in decreased sensation and function of L LE.  Pt also has decreased sensation of B feet.    Limitations  Sitting;Walking;Writing;Lifting;House hold activities;Standing    Currently in Pain?  Yes     Pain Score  8     Pain Location  Leg    Pain Orientation  Right    Pain Descriptors / Indicators  Aching;Constant    Pain Type  Chronic pain    Pain Radiating Towards  Primarily right hip and leg    Pain Frequency  Constant       Aquatic Therapy:  Ambulation, very slow, UE supported by kickboards  4 L forward 4L sideways right and left  Supine float with noodles and relaxation techniques  Supine float with noodles with stretching to bilateral flank, quads/gastrocs - pt tolerates poorly with only minimal stretching for small ranges.     PT Education - 12/12/16 1420    Education provided  Yes    Education Details  Educated on slow movements in water with emphasis on proper body  mechanics and techniques    Person(s) Educated  Patient    Methods  Explanation;Demonstration;Tactile cues;Verbal cues    Comprehension  Verbalized understanding;Returned demonstration       PT Short Term Goals - 12/05/16 1345      PT SHORT TERM GOAL #1   Title  Pt will demonstrate improved pain levels with activty to 6/10 or <.    Baseline  Worst:  9/10, Best: 6/10    Time  4    Period  Weeks    Status  New      PT SHORT TERM GOAL #2   Title  Pt will improve Mod ODI to 60% or less indicating severe disability in order to demonstrate self-reported improvements of pain and mobility.    Baseline  11/15/16: 72% indicating extreme disability    Time  8    Period  Weeks    Status  New        PT Long Term Goals - 11/28/16 1323      PT LONG TERM GOAL #1   Title  Pt will demonstrate independence with HEP in order to manage pain and other symptoms.    Time  8    Period  Weeks    Status  New      PT LONG TERM GOAL #2   Title  Pt will improve gait speed to at least 1.0 m/s in order to demonstrate improvements in LE strength and decreaed pain levels in order to ambulate within limited community ambulator status.    Baseline  11/15/16: 0.5 m/s    Time  8    Period  Weeks    Status  New       PT LONG TERM GOAL #3   Title  Pt will reduce time to complete 5x sit to stand to <30 seconds in order to demonstrate improved LE strength and decreaed levels of pain, demonstrating improvement in functional mobility.    Baseline  11/15/16: 50.38 sec    Time  8    Period  Weeks    Status  New      PT LONG TERM GOAL #4   Title  Pt will improve Mod ODI to 40% or < indicating moderate level of disability in order to demonstrate self-reported improvements in pain and mobility.    Baseline  11/15/16: 72% indicating extreme disability    Time  8    Period  Weeks    Status  New      PT LONG TERM GOAL #5   Title  Pt will improve LE strength to at least 4/5 in all limited planes in order to improve mobility, gait, and ADL ability.    Baseline  Gross strength assessment: 3/5 - 2/5 in L hip blex and B hip ext    Time  8    Period  Weeks    Status  New            Plan - 12/12/16 1422    Clinical Impression Statement  Pt stated she called MD office for x-ray.  Office stated Therapy department could order it.  Will follow through with proper proceedure with primary PT.  Pt conitnues to voice frustration regarding lack of progress and right hip pain which she stated is "bone" pain.      Clinical Presentation  Unstable    Clinical Decision Making  High    Rehab Potential  Fair    Clinical Impairments Affecting Rehab Potential  age, perception of dx, comorbidities    PT Frequency  2x / week    PT Duration  8 weeks    PT Treatment/Interventions  ADLs/Self Care Home Management;Aquatic Therapy;Cryotherapy;Moist Heat;Traction;Ultrasound;Electrical Stimulation;Gait training;Stair training;Functional mobility training;Therapeutic activities;Therapeutic exercise;Balance training;Neuromuscular re-education;Patient/family education;Manual techniques;Passive range of motion;Dry needling;Taping    Consulted and Agree with Plan of Care  Patient       Patient will benefit from skilled therapeutic  intervention in order to improve the following deficits and impairments:  Abnormal gait, Decreased activity tolerance, Decreased endurance, Decreased mobility, Decreased range of motion, Decreased strength, Difficulty walking, Hypomobility, Increased muscle spasms, Pain  Visit Diagnosis: Chronic midline low back pain, with sciatica presence unspecified  Muscle weakness (generalized)  Difficulty in walking, not elsewhere classified     Problem List Patient Active Problem List   Diagnosis Date Noted  . Deep vein thrombosis (DVT) (HCC) 04/08/2015  . May-Thurner syndrome 04/08/2015  . Overdose 02/09/2015  . Ache in joint 11/21/2013  . Arteriovenous fistula (HCC) 06/20/2013  . Absolute anemia 10/19/2012  . Recurrent major depressive disorder, in partial remission (HCC) 07/13/2012  . Chronic pain 01/22/2012  . Clinical depression 01/22/2012  . Disease of lung 01/22/2012  . Fibromyalgia 01/22/2012  . LBP (low back pain) 01/22/2012  . Cannot sleep 01/22/2012  . Emotional neurotic disorder 01/22/2012  . Cannabis abuse, continuous 01/22/2012  . Pulmonary embolism with infarction (HCC) 01/22/2012  . Esophagitis, reflux 01/22/2012  . Current tobacco use 01/22/2012  . Narcotic drug use 11/02/2010  . Post-phlebitic syndrome 07/05/2010  . Borderline personality disorder (HCC) 06/15/2010  . Chronic deep vein thrombosis (DVT) of lower extremity (HCC) 06/15/2010  . Hereditary and idiopathic neuropathy 06/15/2010  . Neurosis, posttraumatic 06/15/2010    Danielle Dess 12/12/2016, 3:44 PM  Danielle Dess, PTA 12/12/16, 3:44 PM    [facility] MAIN [facility] SERVICES [address] [place], [place], [address] Phone: [phone]   Fax:  [phone]  Name: Lindsey Willis MRN: [id number] Date of Birth: 20-Dec-1975

## 2016-12-13 ENCOUNTER — Other Ambulatory Visit: Payer: Self-pay | Admitting: Family Medicine

## 2016-12-13 DIAGNOSIS — Z1231 Encounter for screening mammogram for malignant neoplasm of breast: Secondary | ICD-10-CM

## 2016-12-13 DIAGNOSIS — G8929 Other chronic pain: Secondary | ICD-10-CM | POA: Diagnosis not present

## 2016-12-13 DIAGNOSIS — M25551 Pain in right hip: Secondary | ICD-10-CM | POA: Diagnosis not present

## 2016-12-14 ENCOUNTER — Encounter: Payer: Self-pay | Admitting: Physical Therapy

## 2016-12-14 ENCOUNTER — Ambulatory Visit: Payer: PPO | Admitting: Physical Therapy

## 2016-12-14 DIAGNOSIS — M545 Low back pain: Secondary | ICD-10-CM | POA: Diagnosis not present

## 2016-12-14 DIAGNOSIS — G8929 Other chronic pain: Secondary | ICD-10-CM

## 2016-12-14 DIAGNOSIS — R262 Difficulty in walking, not elsewhere classified: Secondary | ICD-10-CM

## 2016-12-14 DIAGNOSIS — M6281 Muscle weakness (generalized): Secondary | ICD-10-CM

## 2016-12-14 NOTE — Therapy (Signed)
[place] MAIN [facility] SERVICES [address] [place], [place], [address] Phone: [phone]   Fax:  [phone]  Physical Therapy Treatment/ Progress Note  Patient Details  Name: Lindsey Willis MRN: [id number] Date of Birth: Dec 30, 1975 Referring Provider: Fabio Bering   Encounter Date: 12/14/2016  PT End of Session - 12/14/16 0817    Visit Number  6    Number of Visits  17    Date for PT Re-Evaluation  01/10/17    Authorization Type  6/10 g codes    PT Start Time  0812    PT Stop Time  0845    PT Time Calculation (min)  33 min    Activity Tolerance  Patient limited by pain    Behavior During Therapy  [facility] for tasks assessed/performed       Past Medical History:  Diagnosis Date  . Anemia   . Asthma   . Borderline personality disorder ([place])   . Depression   . DVT of leg (deep venous thrombosis) ([place])   . Fibromyalgia   . May-Thurner syndrome   . PTSD (post-traumatic stress disorder)     Past Surgical History:  Procedure Laterality Date  . BREAST CYST ASPIRATION Right    neg  . CHOLECYSTECTOMY    . IVC FILTER PLACEMENT ([facility] HX)    . LEG SURGERY    . TUBAL LIGATION      There were no vitals filed for this visit.  Subjective Assessment - 12/14/16 0815    Subjective  Patient had a MD apt yesterday and she found out she has arthritis in right hip.     Pertinent History  Pt has had chronic low back pain for 10+ years.  Pt has hx of anxiety, asthma, chronic LBP, major depressive disorder, fibromyalgia, May-Thurner Syndrome, RLS and PTSD.  Pt has had a hx of LE DVT's and pulmonary embolism with multiple stene placements and thrombolysis in B LE's with last stent placement occuring in 04/16/10.  Pt is currently on anticoagulants. Pt has had 2 nerve blocks for leg pain in the past resulting in decreased sensation and function of L LE.  Pt also has decreased sensation of B feet.    Limitations  Sitting;Walking;Writing;Lifting;House  hold activities;Standing    How long can you sit comfortably?  5-10 minutes     How long can you stand comfortably?  <5 minutes     How long can you walk comfortably?  5-10 minutes    Diagnostic tests  none    Patient Stated Goals  "reduce overall pain"    Currently in Pain?  Yes    Pain Score  8     Pain Location  Hip    Pain Orientation  Right    Pain Descriptors / Indicators  Aching    Pain Type  Chronic pain    Pain Onset  More than a month ago    Pain Frequency  Constant    Aggravating Factors   standing, sitting, walking,     Pain Relieving Factors  rest medication    Multiple Pain Sites  No          OUTCOME MEASURES: TEST Outcome Interpretation  5 times sit<>stand 50.54 sec >60 yo, >15 sec indicates increased risk for falls  10 meter walk test       . 36 m/sec          m/s <1.0 m/s indicates increased risk for falls; limited community  ambulator  ODI 52%                 Manual therapy Long axis distraction grade 2 and 3 x 8  mins to R hip to reduce hip pain  Patient has 6/10 following manual therapy                    PT Education - 12/14/16 0816    Education provided  Yes    Education Details  review of POC    Person(s) Educated  Patient    Methods  Explanation    Comprehension  Verbalized understanding       PT Short Term Goals - 12/14/16 0846      PT SHORT TERM GOAL #1   Title  Pt will demonstrate improved pain levels with activty to 6/10 or <.    Baseline  Worst: 9/10, Best: 6/10, 8/10 right hip pain    Time  4    Period  Weeks    Status  Partially Met    Target Date  12/14/16      PT SHORT TERM GOAL #2   Title  Pt will improve Mod ODI to 60% or less indicating severe disability in order to demonstrate self-reported improvements of pain and mobility.    Baseline  11/15/16: 72% indicating extreme disability, 52 %    Time  8    Period  Weeks    Status  Partially Met    Target Date  12/14/16        PT Long Term Goals -  12/14/16 0819      PT LONG TERM GOAL #1   Title  Pt will demonstrate independence with HEP in order to manage pain and other symptoms.    Baseline  Patient is in aquatic therapy and is doing some of the exercises out side of the pool    Time  8    Period  Weeks    Status  Partially Met    Target Date  01/11/17      PT LONG TERM GOAL #2   Title  Pt will improve gait speed to at least 1.0 m/s in order to demonstrate improvements in LE strength and decreaed pain levels in order to ambulate within limited community ambulator status.    Baseline  11/15/16: 0.5 m/s, .36 m/sec    Time  8    Period  Weeks    Status  Partially Met    Target Date  01/11/17      PT LONG TERM GOAL #3   Title  Pt will reduce time to complete 5x sit to stand to <30 seconds in order to demonstrate improved LE strength and decreaed levels of pain, demonstrating improvement in functional mobility.    Baseline  11/15/16: 50.38 sec, 50.54 sec    Time  8    Period  Weeks    Status  Partially Met    Target Date  01/11/17      PT LONG TERM GOAL #4   Title  Pt will improve Mod ODI to 40% or < indicating moderate level of disability in order to demonstrate self-reported improvements in pain and mobility.    Baseline  11/15/16: 72% indicating extreme disability, 52 % ODI    Time  8    Period  Weeks    Status  Partially Met    Target Date  01/11/17      PT LONG TERM GOAL #5  Title  Pt will improve LE strength to at least 4/5 in all limited planes in order to improve mobility, gait, and ADL ability.    Baseline  Gross strength assessment: 3/5 - 2/5 in L hip blex and B hip ext    Time  8    Period  Weeks    Status  Partially Met    Target Date  01/11/17            Plan - 12/14/16 0848    Clinical Impression Statement  Patients goals were reassessed today. Her ODI was 72 % and now is 52%. Her gait speed decreased and 5 x sit to stand increased . She responded to manual therpay for right hip pain that began at  8/10 and decreased to 6/10 following long axis distraction. She will continue to benefit from skilled PT to improve overall mobility and quality of life.    Rehab Potential  Fair    Clinical Impairments Affecting Rehab Potential  age, perception of dx, comorbidities    PT Frequency  2x / week    PT Duration  8 weeks    PT Treatment/Interventions  ADLs/Self Care Home Management;Aquatic Therapy;Cryotherapy;Moist Heat;Traction;Ultrasound;Electrical Stimulation;Gait training;Stair training;Functional mobility training;Therapeutic activities;Therapeutic exercise;Balance training;Neuromuscular re-education;Patient/family education;Manual techniques;Passive range of motion;Dry needling;Taping    Consulted and Agree with Plan of Care  Patient       Patient will benefit from skilled therapeutic intervention in order to improve the following deficits and impairments:  Abnormal gait, Decreased activity tolerance, Decreased endurance, Decreased mobility, Decreased range of motion, Decreased strength, Difficulty walking, Hypomobility, Increased muscle spasms, Pain  Visit Diagnosis: Chronic midline low back pain, with sciatica presence unspecified  Muscle weakness (generalized)  Difficulty in walking, not elsewhere classified  Chronic right-sided low back pain, with sciatica presence unspecified     Problem List Patient Active Problem List   Diagnosis Date Noted  . Deep vein thrombosis (DVT) ([place]) 04/08/2015  . May-Thurner syndrome 04/08/2015  . Overdose 02/09/2015  . Ache in joint 11/21/2013  . Arteriovenous fistula ([place]) 06/20/2013  . Absolute anemia 10/19/2012  . Recurrent major depressive disorder, in partial remission ([place]) 07/13/2012  . Chronic pain 01/22/2012  . Clinical depression 01/22/2012  . Disease of lung 01/22/2012  . Fibromyalgia 01/22/2012  . LBP (low back pain) 01/22/2012  . Cannot sleep 01/22/2012  . Emotional neurotic disorder 01/22/2012  . Cannabis abuse, continuous  01/22/2012  . Pulmonary embolism with infarction ([place]) 01/22/2012  . Esophagitis, reflux 01/22/2012  . Current tobacco use 01/22/2012  . Narcotic drug use 11/02/2010  . Post-phlebitic syndrome 07/05/2010  . Borderline personality disorder ([place]) 06/15/2010  . Chronic deep vein thrombosis (DVT) of lower extremity ([place]) 06/15/2010  . Hereditary and idiopathic neuropathy 06/15/2010  . Neurosis, posttraumatic 06/15/2010    Alanson Puls, PT DPT 12/14/2016, 8:55 AM  [place] MAIN [facility] SERVICES [address] [place], [place], [address] Phone: [phone]   Fax:  [phone]  Name: Lindsey Willis MRN: [id number] Date of Birth: 26-Dec-1975

## 2016-12-19 ENCOUNTER — Ambulatory Visit: Payer: PPO

## 2016-12-19 DIAGNOSIS — R262 Difficulty in walking, not elsewhere classified: Secondary | ICD-10-CM

## 2016-12-19 DIAGNOSIS — M545 Low back pain: Secondary | ICD-10-CM | POA: Diagnosis not present

## 2016-12-19 DIAGNOSIS — M6281 Muscle weakness (generalized): Secondary | ICD-10-CM

## 2016-12-19 DIAGNOSIS — G8929 Other chronic pain: Secondary | ICD-10-CM

## 2016-12-19 NOTE — Therapy (Signed)
[place] MAIN [facility] SERVICES [address] [place], [place], [address] Phone: [phone]   Fax:  [phone]  Physical Therapy Treatment  Patient Details  Name: Lindsey Willis MRN: [id number] Date of Birth: 1975/07/25 Referring Provider: Fabio Bering   Encounter Date: 12/19/2016  PT End of Session - 12/19/16 1335    Visit Number  7    Number of Visits  17    Date for PT Re-Evaluation  01/10/17    PT Start Time  0910    PT Stop Time  1000    PT Time Calculation (min)  50 min    Activity Tolerance  Patient limited by pain    Behavior During Therapy  [facility] for tasks assessed/performed       Past Medical History:  Diagnosis Date  . Anemia   . Asthma   . Borderline personality disorder ([place])   . Depression   . DVT of leg (deep venous thrombosis) ([place])   . Fibromyalgia   . May-Thurner syndrome   . PTSD (post-traumatic stress disorder)     Past Surgical History:  Procedure Laterality Date  . BREAST CYST ASPIRATION Right    neg  . CHOLECYSTECTOMY    . IVC FILTER PLACEMENT ([facility] HX)    . LEG SURGERY    . TUBAL LIGATION      There were no vitals filed for this visit.  Subjective Assessment - 12/19/16 1331    Subjective  Pt reports having x-ray of R hip, which shows arthritis and narrowing of joint space according to pt. Pt feels she would like to continue aquatic PT to see if she can begin to feel some results     Pertinent History  Pt has had chronic low back pain for 10+ years.  Pt has hx of anxiety, asthma, chronic LBP, major depressive disorder, fibromyalgia, May-Thurner Syndrome, RLS and PTSD.  Pt has had a hx of LE DVT's and pulmonary embolism with multiple stene placements and thrombolysis in B LE's with last stent placement occuring in 2012.  Pt is currently on anticoagulants. Pt has had 2 nerve blocks for leg pain in the past resulting in decreased sensation and function of L LE.  Pt also has decreased sensation of B  feet.       Enters/exits pool via steps Participates in the following  Ambulation, slow, kick boards for UE support - 6  L fwd - 4 L side  Bench, seated core - bicycle - scissor - flutter  Noodle, 5 min each - Supine float - Hang  Stretching, 3 x 15 sec each - piriformis  - ham string                        PT Education - 12/19/16 1334    Education provided  Yes    Education Details  Educated regarding arthritis management       PT Short Term Goals - 12/19/16 1337      PT SHORT TERM GOAL #1   Title  Pt will demonstrate improved pain levels with activty to 6/10 or <.    Baseline  Worst: 9/10, Best: 6/10, 8/10 right hip pain    Time  4    Period  Weeks    Status  Partially Met      PT SHORT TERM GOAL #2   Title  Pt will improve Mod ODI to 60% or less indicating severe disability in order  to demonstrate self-reported improvements of pain and mobility.    Baseline  11/15/16: 72% indicating extreme disability, 52 %    Time  8    Period  Weeks    Status  Partially Met        PT Long Term Goals - 12/19/16 1337      PT LONG TERM GOAL #1   Title  Pt will demonstrate independence with HEP in order to manage pain and other symptoms.    Baseline  Patient is in aquatic therapy and is doing some of the exercises out side of the pool    Time  8    Period  Weeks    Status  Partially Met      PT LONG TERM GOAL #2   Title  Pt will improve gait speed to at least 1.0 m/s in order to demonstrate improvements in LE strength and decreaed pain levels in order to ambulate within limited community ambulator status.    Baseline  11/15/16: 0.5 m/s, .36 m/sec    Time  8    Period  Weeks    Status  Partially Met      PT LONG TERM GOAL #3   Title  Pt will reduce time to complete 5x sit to stand to <30 seconds in order to demonstrate improved LE strength and decreaed levels of pain, demonstrating improvement in functional mobility.    Baseline  11/15/16: 50.38  sec, 50.54 sec    Time  8    Period  Weeks    Status  Partially Met      PT LONG TERM GOAL #4   Title  Pt will improve Mod ODI to 40% or < indicating moderate level of disability in order to demonstrate self-reported improvements in pain and mobility.    Baseline  11/15/16: 72% indicating extreme disability, 52 % ODI    Time  8    Period  Weeks    Status  Partially Met      PT LONG TERM GOAL #5   Title  Pt will improve LE strength to at least 4/5 in all limited planes in order to improve mobility, gait, and ADL ability.    Baseline  Gross strength assessment: 3/5 - 2/5 in L hip blex and B hip ext    Time  8    Period  Weeks    Status  Partially Met            Plan - 12/19/16 1335    Clinical Impression Statement  Pt tolerates exercises well today. Manages speed well to control onset of sharp pains.     Rehab Potential  Fair    Clinical Impairments Affecting Rehab Potential  age, perception of dx, comorbidities    PT Frequency  2x / week    PT Duration  8 weeks    PT Treatment/Interventions  ADLs/Self Care Home Management;Aquatic Therapy;Cryotherapy;Moist Heat;Traction;Ultrasound;Electrical Stimulation;Gait training;Stair training;Functional mobility training;Therapeutic activities;Therapeutic exercise;Balance training;Neuromuscular re-education;Patient/family education;Manual techniques;Passive range of motion;Dry needling;Taping    Consulted and Agree with Plan of Care  Patient       Patient will benefit from skilled therapeutic intervention in order to improve the following deficits and impairments:  Abnormal gait, Decreased activity tolerance, Decreased endurance, Decreased mobility, Decreased range of motion, Decreased strength, Difficulty walking, Hypomobility, Increased muscle spasms, Pain  Visit Diagnosis: Chronic midline low back pain, with sciatica presence unspecified  Muscle weakness (generalized)  Difficulty in walking, not elsewhere classified  Chronic  right-sided  low back pain, with sciatica presence unspecified     Problem List Patient Active Problem List   Diagnosis Date Noted  . Deep vein thrombosis (DVT) ([place]) 04/08/2015  . May-Thurner syndrome 04/08/2015  . Overdose 02/09/2015  . Ache in joint 11/21/2013  . Arteriovenous fistula ([place]) 06/20/2013  . Absolute anemia 10/19/2012  . Recurrent major depressive disorder, in partial remission ([place]) 07/13/2012  . Chronic pain 01/22/2012  . Clinical depression 01/22/2012  . Disease of lung 01/22/2012  . Fibromyalgia 01/22/2012  . LBP (low back pain) 01/22/2012  . Cannot sleep 01/22/2012  . Emotional neurotic disorder 01/22/2012  . Cannabis abuse, continuous 01/22/2012  . Pulmonary embolism with infarction ([place]) 01/22/2012  . Esophagitis, reflux 01/22/2012  . Current tobacco use 01/22/2012  . Narcotic drug use 11/02/2010  . Post-phlebitic syndrome 07/05/2010  . Borderline personality disorder ([place]) 06/15/2010  . Chronic deep vein thrombosis (DVT) of lower extremity ([place]) 06/15/2010  . Hereditary and idiopathic neuropathy 06/15/2010  . Neurosis, posttraumatic 06/15/2010    Larae Grooms 12/19/2016, 1:39 PM  [place] MAIN [facility] SERVICES [address] [place], [place], [address] Phone: [phone]   Fax:  [phone]  Name: Lindsey Willis MRN: [id number] Date of Birth: [birth year]

## 2017-01-02 ENCOUNTER — Ambulatory Visit: Payer: PPO

## 2017-01-02 DIAGNOSIS — R262 Difficulty in walking, not elsewhere classified: Secondary | ICD-10-CM

## 2017-01-02 DIAGNOSIS — M545 Low back pain: Secondary | ICD-10-CM | POA: Diagnosis not present

## 2017-01-02 DIAGNOSIS — G8929 Other chronic pain: Secondary | ICD-10-CM

## 2017-01-02 DIAGNOSIS — M6281 Muscle weakness (generalized): Secondary | ICD-10-CM

## 2017-01-02 NOTE — Therapy (Signed)
[place] MAIN [facility] SERVICES [address] [place], [place], [address] Phone: [phone]   Fax:  [phone]  Physical Therapy Treatment  Patient Details  Name: Lindsey Willis MRN: [id number] Date of Birth: 08/11/1975 Referring Provider: Fabio Bering   Encounter Date: 01/02/2017  PT End of Session - 01/02/17 1418    Visit Number  8    Number of Visits  17    Date for PT Re-Evaluation  01/10/17    PT Start Time  0900    PT Stop Time  0955    PT Time Calculation (min)  55 min    Activity Tolerance  Patient limited by pain    Behavior During Therapy  [facility] for tasks assessed/performed       Past Medical History:  Diagnosis Date  . Anemia   . Asthma   . Borderline personality disorder ([place])   . Depression   . DVT of leg (deep venous thrombosis) ([place])   . Fibromyalgia   . May-Thurner syndrome   . PTSD (post-traumatic stress disorder)     Past Surgical History:  Procedure Laterality Date  . BREAST CYST ASPIRATION Right    neg  . CHOLECYSTECTOMY    . IVC FILTER PLACEMENT ([facility] HX)    . LEG SURGERY    . TUBAL LIGATION      There were no vitals filed for this visit.  Subjective Assessment - 01/02/17 1415    Subjective  Pt reports 7/10 pain in R hip today. Pt notes taking a muscle relaxer that does help make muscular pain tolerable, but notes nothing has helped the deep hip pain.     Pertinent History  Pt has had chronic low back pain for 10+ years.  Pt has hx of anxiety, asthma, chronic LBP, major depressive disorder, fibromyalgia, May-Thurner Syndrome, RLS and PTSD.  Pt has had a hx of LE DVT's and pulmonary embolism with multiple stene placements and thrombolysis in B LE's with last stent placement occuring in 2012.  Pt is currently on anticoagulants. Pt has had 2 nerve blocks for leg pain in the past resulting in decreased sensation and function of L LE.  Pt also has decreased sensation of B feet.    Limitations   Sitting;Walking;Writing;Lifting;House hold activities;Standing    How long can you sit comfortably?  5-10 minutes     How long can you stand comfortably?  <5 minutes     How long can you walk comfortably?  5-10 minutes    Diagnostic tests  none    Patient Stated Goals  "reduce overall pain"    Pain Onset  More than a month ago     Pt enters/exits pool via steps Participates in the following  Ambulation  - 6 L F/S - 2 L side  Wall core ex's with UE, deep water LE's out from wall, but no squat position, 20x each - sh flex/ext - sh abd/add - sh horiz abd/add  Wall core ex's with LE, deep water, same position as above, 20x each, slow - SKTC, small range - hip abd/add, very small range from midline  Stretches, 3 x 15 sec each - hamstrings, seated - gastroc - SKTC - piriformis                           PT Education - 01/02/17 1417    Education provided  Yes    Education Details  Re education  on core strengthening exercises. Re educated on avoiding exercises/activity that worsens pain while performing. Educated on seated ham/gastroc stretch.        PT Short Term Goals - 01/02/17 1420      PT SHORT TERM GOAL #1   Title  Pt will demonstrate improved pain levels with activty to 6/10 or <.    Baseline  Worst: 9/10, Best: 6/10, 8/10 right hip pain    Time  4    Period  Weeks    Status  Partially Met      PT SHORT TERM GOAL #2   Title  Pt will improve Mod ODI to 60% or less indicating severe disability in order to demonstrate self-reported improvements of pain and mobility.    Baseline  11/15/16: 72% indicating extreme disability, 52 %    Time  8    Period  Weeks    Status  Partially Met        PT Long Term Goals - 01/02/17 1420      PT LONG TERM GOAL #1   Title  Pt will demonstrate independence with HEP in order to manage pain and other symptoms.    Baseline  Patient is in aquatic therapy and is doing some of the exercises out side of the pool     Time  8    Period  Weeks    Status  Partially Met      PT LONG TERM GOAL #2   Title  Pt will improve gait speed to at least 1.0 m/s in order to demonstrate improvements in LE strength and decreaed pain levels in order to ambulate within limited community ambulator status.    Baseline  11/15/16: 0.5 m/s, .36 m/sec    Time  8    Period  Weeks    Status  Partially Met      PT LONG TERM GOAL #3   Title  Pt will reduce time to complete 5x sit to stand to <30 seconds in order to demonstrate improved LE strength and decreaed levels of pain, demonstrating improvement in functional mobility.    Baseline  11/15/16: 50.38 sec, 50.54 sec    Time  8    Period  Weeks    Status  Partially Met      PT LONG TERM GOAL #4   Title  Pt will improve Mod ODI to 40% or < indicating moderate level of disability in order to demonstrate self-reported improvements in pain and mobility.    Baseline  11/15/16: 72% indicating extreme disability, 52 % ODI    Time  8    Period  Weeks    Status  Partially Met      PT LONG TERM GOAL #5   Title  Pt will improve LE strength to at least 4/5 in all limited planes in order to improve mobility, gait, and ADL ability.    Baseline  Gross strength assessment: 3/5 - 2/5 in L hip blex and B hip ext    Time  8    Period  Weeks    Status  Partially Met            Plan - 01/02/17 1419    Clinical Impression Statement  Performs exercises overall well. Does not tolerate side ambulation so deferred.     Rehab Potential  Fair    Clinical Impairments Affecting Rehab Potential  age, perception of dx, comorbidities    PT Frequency  2x / week  PT Duration  8 weeks    PT Treatment/Interventions  ADLs/Self Care Home Management;Aquatic Therapy;Cryotherapy;Moist Heat;Traction;Ultrasound;Electrical Stimulation;Gait training;Stair training;Functional mobility training;Therapeutic activities;Therapeutic exercise;Balance training;Neuromuscular re-education;Patient/family  education;Manual techniques;Passive range of motion;Dry needling;Taping    Consulted and Agree with Plan of Care  Patient       Patient will benefit from skilled therapeutic intervention in order to improve the following deficits and impairments:  Abnormal gait, Decreased activity tolerance, Decreased endurance, Decreased mobility, Decreased range of motion, Decreased strength, Difficulty walking, Hypomobility, Increased muscle spasms, Pain  Visit Diagnosis: Chronic midline low back pain, with sciatica presence unspecified  Muscle weakness (generalized)  Difficulty in walking, not elsewhere classified  Chronic right-sided low back pain, with sciatica presence unspecified     Problem List Patient Active Problem List   Diagnosis Date Noted  . Deep vein thrombosis (DVT) ([place]) 04/08/2015  . May-Thurner syndrome 04/08/2015  . Overdose 02/09/2015  . Ache in joint 11/21/2013  . Arteriovenous fistula ([place]) 06/20/2013  . Absolute anemia 10/19/2012  . Recurrent major depressive disorder, in partial remission ([place]) 07/13/2012  . Chronic pain 01/22/2012  . Clinical depression 01/22/2012  . Disease of lung 01/22/2012  . Fibromyalgia 01/22/2012  . LBP (low back pain) 01/22/2012  . Cannot sleep 01/22/2012  . Emotional neurotic disorder 01/22/2012  . Cannabis abuse, continuous 01/22/2012  . Pulmonary embolism with infarction ([place]) 01/22/2012  . Esophagitis, reflux 01/22/2012  . Current tobacco use 01/22/2012  . Narcotic drug use 11/02/2010  . Post-phlebitic syndrome 07/05/2010  . Borderline personality disorder ([place]) 06/15/2010  . Chronic deep vein thrombosis (DVT) of lower extremity ([place]) 06/15/2010  . Hereditary and idiopathic neuropathy 06/15/2010  . Neurosis, posttraumatic 06/15/2010    Larae Grooms 01/02/2017, 2:21 PM  [place] MAIN [facility] SERVICES [address] [place], [place], [address] Phone: [phone]   Fax:   [phone]  Name: Lindsey Willis MRN: [id number] Date of Birth: 1975-08-22

## 2017-01-04 ENCOUNTER — Ambulatory Visit: Payer: PPO

## 2017-01-09 ENCOUNTER — Ambulatory Visit: Payer: PPO | Attending: Pain Medicine

## 2017-01-09 DIAGNOSIS — G8929 Other chronic pain: Secondary | ICD-10-CM | POA: Insufficient documentation

## 2017-01-09 DIAGNOSIS — M545 Low back pain: Secondary | ICD-10-CM | POA: Diagnosis not present

## 2017-01-09 DIAGNOSIS — M6281 Muscle weakness (generalized): Secondary | ICD-10-CM | POA: Diagnosis not present

## 2017-01-09 DIAGNOSIS — R262 Difficulty in walking, not elsewhere classified: Secondary | ICD-10-CM | POA: Diagnosis not present

## 2017-01-09 NOTE — Therapy (Signed)
[place] MAIN [facility] SERVICES [address] [place], [place], [address] Phone: [phone]   Fax:  [phone]  Physical Therapy Treatment  Patient Details  Name: Lindsey Willis MRN: [id number] Date of Birth: 1975/12/27 Referring Provider: Fabio Bering   Encounter Date: 01/09/2017  PT End of Session - 01/09/17 1633    Visit Number  9    Number of Visits  17    Date for PT Re-Evaluation  01/10/17    PT Start Time  1115    PT Stop Time  1210    PT Time Calculation (min)  55 min    Activity Tolerance  Patient limited by pain       Past Medical History:  Diagnosis Date  . Anemia   . Asthma   . Borderline personality disorder ([place])   . Depression   . DVT of leg (deep venous thrombosis) ([place])   . Fibromyalgia   . May-Thurner syndrome   . PTSD (post-traumatic stress disorder)     Past Surgical History:  Procedure Laterality Date  . BREAST CYST ASPIRATION Right    neg  . CHOLECYSTECTOMY    . IVC FILTER PLACEMENT ([facility] HX)    . LEG SURGERY    . TUBAL LIGATION      There were no vitals filed for this visit.  Subjective Assessment - 01/09/17 1631    Subjective  Pt reports 7/10 pain in R hip today; visibly looks to be in greater distress with pain today than previous days and states it varies without being able to pinpoint what makes it worse.     Pertinent History  Pt has had chronic low back pain for 10+ years.  Pt has hx of anxiety, asthma, chronic LBP, major depressive disorder, fibromyalgia, May-Thurner Syndrome, RLS and PTSD.  Pt has had a hx of LE DVT's and pulmonary embolism with multiple stene placements and thrombolysis in B LE's with last stent placement occuring in 2012.  Pt is currently on anticoagulants. Pt has had 2 nerve blocks for leg pain in the past resulting in decreased sensation and function of L LE.  Pt also has decreased sensation of B feet.    Limitations  Sitting;Walking;Writing;Lifting;House hold  activities;Standing    How long can you sit comfortably?  5-10 minutes     How long can you stand comfortably?  <5 minutes     How long can you walk comfortably?  5-10 minutes    Diagnostic tests  none    Patient Stated Goals  "reduce overall pain"    Pain Onset  More than a month ago      Pt enters/exits pool via steps and heavy use of handrails.  Participates in the following  Ambulation:  - 6 L fwd  Wall core stabilization exercise with UE in straight leg position; shoulders in water; 20x each, slow pace - sh flex/ext - sh abd/add - sh horizontal abd/add  Wall core stabilization with LE work, 20x  - hip flex  Noodle hang, 2 under each armpit - 5 min - hip pendulums, cw/ccw 20x each, B - 1 min hang  Stretching, 3 x 15 sec - SKTC - Piriformis - hamstrings                          PT Education - 01/09/17 1632    Education provided  Yes    Education Details  hip pendulums; re educated on  core stabilization at wall with UE and LE    Person(s) Educated  Patient    Methods  Explanation;Demonstration;Verbal cues       PT Short Term Goals - 01/09/17 1635      PT SHORT TERM GOAL #1   Title  Pt will demonstrate improved pain levels with activty to 6/10 or <.    Baseline  Worst: 9/10, Best: 6/10, 8/10 right hip pain    Time  4    Period  Weeks    Status  Partially Met      PT SHORT TERM GOAL #2   Title  Pt will improve Mod ODI to 60% or less indicating severe disability in order to demonstrate self-reported improvements of pain and mobility.    Baseline  11/15/16: 72% indicating extreme disability, 52 %    Time  8    Period  Weeks    Status  Partially Met        PT Long Term Goals - 01/09/17 1635      PT LONG TERM GOAL #1   Title  Pt will demonstrate independence with HEP in order to manage pain and other symptoms.    Baseline  Patient is in aquatic therapy and is doing some of the exercises out side of the pool    Time  8    Period   Weeks    Status  Partially Met      PT LONG TERM GOAL #2   Title  Pt will improve gait speed to at least 1.0 m/s in order to demonstrate improvements in LE strength and decreaed pain levels in order to ambulate within limited community ambulator status.    Baseline  11/15/16: 0.5 m/s, .36 m/sec    Time  8    Period  Weeks    Status  Partially Met      PT LONG TERM GOAL #3   Title  Pt will reduce time to complete 5x sit to stand to <30 seconds in order to demonstrate improved LE strength and decreaed levels of pain, demonstrating improvement in functional mobility.    Baseline  11/15/16: 50.38 sec, 50.54 sec    Time  8    Period  Weeks    Status  Partially Met      PT LONG TERM GOAL #4   Title  Pt will improve Mod ODI to 40% or < indicating moderate level of disability in order to demonstrate self-reported improvements in pain and mobility.    Baseline  11/15/16: 72% indicating extreme disability, 52 % ODI    Time  8    Period  Weeks    Status  Partially Met      PT LONG TERM GOAL #5   Title  Pt will improve LE strength to at least 4/5 in all limited planes in order to improve mobility, gait, and ADL ability.    Baseline  Gross strength assessment: 3/5 - 2/5 in L hip blex and B hip ext    Time  8    Period  Weeks    Status  Partially Met            Plan - 01/09/17 1633    Clinical Impression Statement  Pt continues with significant pain even in non weight bearing environment. Cautious with activity to avoid any increased symptoms during session. Deferred side stepping ambulation, as pt has not been tolerating well.     Rehab Potential  Fair  Clinical Impairments Affecting Rehab Potential  age, perception of dx, comorbidities    PT Frequency  2x / week    PT Duration  8 weeks    PT Treatment/Interventions  ADLs/Self Care Home Management;Aquatic Therapy;Cryotherapy;Moist Heat;Traction;Ultrasound;Electrical Stimulation;Gait training;Stair training;Functional mobility  training;Therapeutic activities;Therapeutic exercise;Balance training;Neuromuscular re-education;Patient/family education;Manual techniques;Passive range of motion;Dry needling;Taping    Consulted and Agree with Plan of Care  Patient       Patient will benefit from skilled therapeutic intervention in order to improve the following deficits and impairments:  Abnormal gait, Decreased activity tolerance, Decreased endurance, Decreased mobility, Decreased range of motion, Decreased strength, Difficulty walking, Hypomobility, Increased muscle spasms, Pain  Visit Diagnosis: Chronic midline low back pain, with sciatica presence unspecified  Muscle weakness (generalized)  Difficulty in walking, not elsewhere classified  Chronic right-sided low back pain, with sciatica presence unspecified     Problem List Patient Active Problem List   Diagnosis Date Noted  . Deep vein thrombosis (DVT) ([place]) 04/08/2015  . May-Thurner syndrome 04/08/2015  . Overdose 02/09/2015  . Ache in joint 11/21/2013  . Arteriovenous fistula ([place]) 06/20/2013  . Absolute anemia 10/19/2012  . Recurrent major depressive disorder, in partial remission ([place]) 07/13/2012  . Chronic pain 01/22/2012  . Clinical depression 01/22/2012  . Disease of lung 01/22/2012  . Fibromyalgia 01/22/2012  . LBP (low back pain) 01/22/2012  . Cannot sleep 01/22/2012  . Emotional neurotic disorder 01/22/2012  . Cannabis abuse, continuous 01/22/2012  . Pulmonary embolism with infarction ([place]) 01/22/2012  . Esophagitis, reflux 01/22/2012  . Current tobacco use 01/22/2012  . Narcotic drug use 11/02/2010  . Post-phlebitic syndrome 07/05/2010  . Borderline personality disorder ([place]) 06/15/2010  . Chronic deep vein thrombosis (DVT) of lower extremity ([place]) 06/15/2010  . Hereditary and idiopathic neuropathy 06/15/2010  . Neurosis, posttraumatic 06/15/2010    Larae Grooms 01/09/2017, 4:36 PM  [place]  MAIN [facility] SERVICES [address] [place], [place], [address] Phone: [phone]   Fax:  [phone]  Name: WINDY DUDEK MRN: [id number] Date of Birth: [birth year]

## 2017-01-11 ENCOUNTER — Encounter: Payer: PPO | Admitting: Physical Therapy

## 2017-01-16 ENCOUNTER — Ambulatory Visit: Payer: PPO

## 2017-01-16 DIAGNOSIS — M545 Low back pain: Secondary | ICD-10-CM | POA: Diagnosis not present

## 2017-01-16 DIAGNOSIS — M6281 Muscle weakness (generalized): Secondary | ICD-10-CM

## 2017-01-16 DIAGNOSIS — R262 Difficulty in walking, not elsewhere classified: Secondary | ICD-10-CM

## 2017-01-16 DIAGNOSIS — G8929 Other chronic pain: Secondary | ICD-10-CM

## 2017-01-16 NOTE — Therapy (Signed)
[place] MAIN [facility] SERVICES [address] [place], [place], [address] Phone: [phone]   Fax:  [phone]  Physical Therapy Treatment  Patient Details  Name: Lindsey Willis MRN: [id number] Date of Birth: [birth year] Referring Provider: Fabio Bering   Encounter Date: 01/16/2017  PT End of Session - 01/16/17 1344    PT Start Time  1115    PT Stop Time  1215    PT Time Calculation (min)  60 min    Equipment Utilized During Treatment  Gait belt       Past Medical History:  Diagnosis Date  . Anemia   . Asthma   . Borderline personality disorder ([place])   . Depression   . DVT of leg (deep venous thrombosis) ([place])   . Fibromyalgia   . May-Thurner syndrome   . PTSD (post-traumatic stress disorder)     Past Surgical History:  Procedure Laterality Date  . BREAST CYST ASPIRATION Right    neg  . CHOLECYSTECTOMY    . IVC FILTER PLACEMENT ([facility] HX)    . LEG SURGERY    . TUBAL LIGATION      There were no vitals filed for this visit.  Subjective Assessment - 01/16/17 1338    Subjective  Pt reports feeling mild increased soreness about R hip post last session, but not debilitating, as she has in the past. Pt was still able to complete her daily tasks without problems and soreness disapated  the next day. Currently pain in R hip is 6/10, the least it has been since beginning PT.     Pertinent History  Pt has had chronic low back pain for 10+ years.  Pt has hx of anxiety, asthma, chronic LBP, major depressive disorder, fibromyalgia, May-Thurner Syndrome, RLS and PTSD.  Pt has had a hx of LE DVT's and pulmonary embolism with multiple stene placements and thrombolysis in B LE's with last stent placement occuring in 2012.  Pt is currently on anticoagulants. Pt has had 2 nerve blocks for leg pain in the past resulting in decreased sensation and function of L LE.  Pt also has decreased sensation of B feet.    Limitations   Sitting;Walking;Writing;Lifting;House hold activities;Standing    How long can you sit comfortably?  5-10 minutes     How long can you stand comfortably?  <5 minutes     How long can you walk comfortably?  5-10 minutes    Diagnostic tests  none    Patient Stated Goals  "reduce overall pain"    Pain Onset  More than a month ago      Pt enters/exits pool via steps Participates in the following  Ambulation, with UE support - Fwd 4L - Side 4L  Noodle float, 5 min  Wall core stabilization with UEs/LEs - sh flex/ext, 20x - sh abd/add, 20x - sh horiz abd/add, 20x - hip flex, 2 x 10 ea - hip abd/add, 2 x 10 ea  Stretching, 3 x 15 sec ea - SKTC - piriformis - seated hamstrings                          PT Short Term Goals - 01/16/17 1345      PT SHORT TERM GOAL #1   Title  Pt will demonstrate improved pain levels with activty to 6/10 or <.    Baseline  Worst: 9/10, Best: 6/10, 8/10 right hip pain    Time  4    Period  Weeks    Status  Partially Met      PT SHORT TERM GOAL #2   Title  Pt will improve Mod ODI to 60% or less indicating severe disability in order to demonstrate self-reported improvements of pain and mobility.    Baseline  11/15/16: 72% indicating extreme disability, 52 %    Time  8    Period  Weeks    Status  Partially Met        PT Long Term Goals - 01/16/17 1346      PT LONG TERM GOAL #1   Title  Pt will demonstrate independence with HEP in order to manage pain and other symptoms.    Baseline  Patient is in aquatic therapy and is doing some of the exercises out side of the pool    Time  8    Period  Weeks    Status  Partially Met      PT LONG TERM GOAL #2   Title  Pt will improve gait speed to at least 1.0 m/s in order to demonstrate improvements in LE strength and decreaed pain levels in order to ambulate within limited community ambulator status.    Baseline  11/15/16: 0.5 m/s, .36 m/sec    Time  8    Period  Weeks     Status  Partially Met      PT LONG TERM GOAL #3   Title  Pt will reduce time to complete 5x sit to stand to <30 seconds in order to demonstrate improved LE strength and decreaed levels of pain, demonstrating improvement in functional mobility.    Baseline  11/15/16: 50.38 sec, 50.54 sec    Time  8    Period  Weeks    Status  Partially Met      PT LONG TERM GOAL #4   Title  Pt will improve Mod ODI to 40% or < indicating moderate level of disability in order to demonstrate self-reported improvements in pain and mobility.    Baseline  11/15/16: 72% indicating extreme disability, 52 % ODI    Time  8    Period  Weeks    Status  Partially Met      PT LONG TERM GOAL #5   Title  Pt will improve LE strength to at least 4/5 in all limited planes in order to improve mobility, gait, and ADL ability.    Baseline  Gross strength assessment: 3/5 - 2/5 in L hip blex and B hip ext    Time  8    Period  Weeks    Status  Partially Met            Plan - 01/16/17 1342    Clinical Impression Statement  Pt tolerated session well; able to tolerate sidestepping again this session without increased pain above baseline. Continued with abdominal stabilization exercises at wall using UE and LEs with slightly improved speed with UEs and range with LEs    Rehab Potential  Fair    Clinical Impairments Affecting Rehab Potential  age, perception of dx, comorbidities    PT Frequency  2x / week    PT Duration  8 weeks    PT Treatment/Interventions  ADLs/Self Care Home Management;Aquatic Therapy;Cryotherapy;Moist Heat;Traction;Ultrasound;Electrical Stimulation;Gait training;Stair training;Functional mobility training;Therapeutic activities;Therapeutic exercise;Balance training;Neuromuscular re-education;Patient/family education;Manual techniques;Passive range of motion;Dry needling;Taping    Consulted and Agree with Plan of Care  Patient       Patient will  benefit from skilled therapeutic intervention in order  to improve the following deficits and impairments:  Abnormal gait, Decreased activity tolerance, Decreased endurance, Decreased mobility, Decreased range of motion, Decreased strength, Difficulty walking, Hypomobility, Increased muscle spasms, Pain  Visit Diagnosis: Chronic midline low back pain, with sciatica presence unspecified  Muscle weakness (generalized)  Difficulty in walking, not elsewhere classified  Chronic right-sided low back pain, with sciatica presence unspecified     Problem List Patient Active Problem List   Diagnosis Date Noted  . Deep vein thrombosis (DVT) ([place]) 04/08/2015  . May-Thurner syndrome 04/08/2015  . Overdose 02/09/2015  . Ache in joint 11/21/2013  . Arteriovenous fistula ([place]) 06/20/2013  . Absolute anemia 10/19/2012  . Recurrent major depressive disorder, in partial remission ([place]) 07/13/2012  . Chronic pain 01/22/2012  . Clinical depression 01/22/2012  . Disease of lung 01/22/2012  . Fibromyalgia 01/22/2012  . LBP (low back pain) 01/22/2012  . Cannot sleep 01/22/2012  . Emotional neurotic disorder 01/22/2012  . Cannabis abuse, continuous 01/22/2012  . Pulmonary embolism with infarction ([place]) 01/22/2012  . Esophagitis, reflux 01/22/2012  . Current tobacco use 01/22/2012  . Narcotic drug use 11/02/2010  . Post-phlebitic syndrome 07/05/2010  . Borderline personality disorder ([place]) 06/15/2010  . Chronic deep vein thrombosis (DVT) of lower extremity ([place]) 06/15/2010  . Hereditary and idiopathic neuropathy 06/15/2010  . Neurosis, posttraumatic 06/15/2010    Larae Grooms 01/16/2017, 1:53 PM  [place] MAIN [facility] SERVICES [address] [place], [place], [address] Phone: [phone]   Fax:  [phone]  Name: Lindsey Willis MRN: [id number] Date of Birth: [birth year]

## 2017-01-22 DIAGNOSIS — F331 Major depressive disorder, recurrent, moderate: Secondary | ICD-10-CM | POA: Diagnosis not present

## 2017-01-23 ENCOUNTER — Encounter: Payer: Self-pay | Admitting: Physical Therapy

## 2017-01-23 ENCOUNTER — Ambulatory Visit: Payer: PPO | Admitting: Physical Therapy

## 2017-01-23 DIAGNOSIS — R262 Difficulty in walking, not elsewhere classified: Secondary | ICD-10-CM

## 2017-01-23 DIAGNOSIS — M545 Low back pain: Secondary | ICD-10-CM | POA: Diagnosis not present

## 2017-01-23 DIAGNOSIS — M6281 Muscle weakness (generalized): Secondary | ICD-10-CM

## 2017-01-23 DIAGNOSIS — G8929 Other chronic pain: Secondary | ICD-10-CM

## 2017-01-23 NOTE — Therapy (Signed)
[place] MAIN [facility] SERVICES [address] [place], [place], [address] Phone: [phone]   Fax:  [phone]  Physical Therapy Treatment  Patient Details  Name: Lindsey Willis MRN: [id number] Date of Birth: 11-12-1975 Referring Provider: Fabio Bering   Encounter Date: 01/23/2017  PT End of Session - 01/23/17 1453    Visit Number  11    Number of Visits  33    Date for PT Re-Evaluation  03/07/17    Authorization Type  7/10    PT Start Time  0250    PT Stop Time  0330    PT Time Calculation (min)  40 min    Equipment Utilized During Treatment  Gait belt    Activity Tolerance  Patient limited by pain    Behavior During Therapy  [facility] for tasks assessed/performed       Past Medical History:  Diagnosis Date  . Anemia   . Asthma   . Borderline personality disorder ([place])   . Depression   . DVT of leg (deep venous thrombosis) ([place])   . Fibromyalgia   . May-Thurner syndrome   . PTSD (post-traumatic stress disorder)     Past Surgical History:  Procedure Laterality Date  . BREAST CYST ASPIRATION Right    neg  . CHOLECYSTECTOMY    . IVC FILTER PLACEMENT ([facility] HX)    . LEG SURGERY    . TUBAL LIGATION      There were no vitals filed for this visit.  Subjective Assessment - 01/23/17 1452    Subjective  Pt reports feeling mild increased soreness about R hip post last session, but not debilitating, as she has in the past. Pt was still able to complete her daily tasks without problems and soreness disapated  the next day. Currently pain in R hip is 7/10, the least it has been since beginning PT.     Pertinent History  Pt has had chronic low back pain for 10+ years.  Pt has hx of anxiety, asthma, chronic LBP, major depressive disorder, fibromyalgia, May-Thurner Syndrome, RLS and PTSD.  Pt has had a hx of LE DVT's and pulmonary embolism with multiple stene placements and thrombolysis in B LE's with last stent placement occuring in  2012.  Pt is currently on anticoagulants. Pt has had 2 nerve blocks for leg pain in the past resulting in decreased sensation and function of L LE.  Pt also has decreased sensation of B feet.    Limitations  Sitting;Walking;Writing;Lifting;House hold activities;Standing    How long can you sit comfortably?  5-10 minutes     How long can you stand comfortably?  <5 minutes     How long can you walk comfortably?  5-10 minutes    Diagnostic tests  none    Patient Stated Goals  "reduce overall pain"    Currently in Pain?  Yes    Pain Score  7     Pain Location  Hip    Pain Orientation  Right    Pain Descriptors / Indicators  Aching    Pain Type  Chronic pain    Pain Onset  More than a month ago    Pain Frequency  Constant    Aggravating Factors   standing, sitting, walking    Pain Relieving Factors  medication    Effect of Pain on Daily Activities  decrease in activities    Multiple Pain Sites  No       Patient  performed out come measures including ODI, 5 x sit to stand, and 10 MW and goals were addressed and patient was re certified for continued PT . Patient has made progress with goals # 1-#5,                        PT Education - 01/23/17 1453    Education provided  Yes    Education Details  plan of care    Person(s) Educated  Patient    Methods  Explanation    Comprehension  Verbalized understanding       PT Short Term Goals - 01/16/17 1345      PT SHORT TERM GOAL #1   Title  Pt will demonstrate improved pain levels with activty to 6/10 or <.    Baseline  Worst: 9/10, Best: 6/10, 8/10 right hip pain    Time  4    Period  Weeks    Status  Partially Met      PT SHORT TERM GOAL #2   Title  Pt will improve Mod ODI to 60% or less indicating severe disability in order to demonstrate self-reported improvements of pain and mobility.    Baseline  11/15/16: 72% indicating extreme disability, 52 %    Time  8    Period  Weeks    Status  Partially Met         PT Long Term Goals - 01/23/17 1455      PT LONG TERM GOAL #1   Title  Pt will demonstrate independence with HEP in order to manage pain and other symptoms.    Baseline  Patient is in aquatic therapy and is doing some of the exercises out side of the pool    Time  8    Period  Weeks    Status  Partially Met    Target Date  03/07/17      PT LONG TERM GOAL #2   Title  Pt will improve gait speed to at least 1.0 m/s in order to demonstrate improvements in LE strength and decreaed pain levels in order to ambulate within limited community ambulator status.    Baseline  11/15/16: 0.5 m/s, .36 m/sec; .45 sec 01/23/17    Time  8    Period  Weeks    Status  Partially Met    Target Date  03/07/16      PT LONG TERM GOAL #3   Title  Pt will reduce time to complete 5x sit to stand to <30 seconds in order to demonstrate improved LE strength and decreaed levels of pain, demonstrating improvement in functional mobility.    Baseline  11/15/16: 50.38 sec, 50.54 sec:01/23/17 45.33 sec    Time  8    Period  Weeks    Status  Partially Met    Target Date  03/07/16      PT LONG TERM GOAL #4   Title  Pt will improve Mod ODI to 40% or < indicating moderate level of disability in order to demonstrate self-reported improvements in pain and mobility.    Baseline  11/15/16: 72% indicating extreme disability, 52 % ODI; 44% ODI    Time  8    Period  Weeks    Target Date  03/07/16      PT LONG TERM GOAL #5   Title  Pt will improve LE strength to at least 4/5 in all limited planes in order to improve mobility, gait, and  ADL ability.    Baseline  Gross strength assessment: 3/5 - 2/5 in L hip blex and B hip ext    Time  8    Period  Weeks    Status  Partially Met    Target Date  03/07/16            Plan - 01/23/17 1454    Clinical Impression Statement  Patient performed outcome measures and goals were checked. Patient performed out come measures including ODI, 5 x sit to stand, and 10 MW and  goals were addressed and patient was re certified for continued PT . Patient has made progress with goals # 1-#5, She will continue to benefit from skilled PT to improve strength, decrease pain and improve mobility.     Rehab Potential  Fair    Clinical Impairments Affecting Rehab Potential  age, perception of dx, comorbidities    PT Frequency  2x / week    PT Duration  8 weeks    PT Treatment/Interventions  ADLs/Self Care Home Management;Aquatic Therapy;Cryotherapy;Moist Heat;Traction;Ultrasound;Electrical Stimulation;Gait training;Stair training;Functional mobility training;Therapeutic activities;Therapeutic exercise;Balance training;Neuromuscular re-education;Patient/family education;Manual techniques;Passive range of motion;Dry needling;Taping    PT Next Visit Plan  begin aquatic therapy    PT Home Exercise Plan  abdominal bracing in hooklying     Consulted and Agree with Plan of Care  Patient       Patient will benefit from skilled therapeutic intervention in order to improve the following deficits and impairments:  Abnormal gait, Decreased activity tolerance, Decreased endurance, Decreased mobility, Decreased range of motion, Decreased strength, Difficulty walking, Hypomobility, Increased muscle spasms, Pain  Visit Diagnosis: Chronic midline low back pain, with sciatica presence unspecified  Muscle weakness (generalized)  Difficulty in walking, not elsewhere classified  Chronic right-sided low back pain, with sciatica presence unspecified     Problem List Patient Active Problem List   Diagnosis Date Noted  . Deep vein thrombosis (DVT) ([place]) 04/08/2015  . May-Thurner syndrome 04/08/2015  . Overdose 02/09/2015  . Ache in joint 11/21/2013  . Arteriovenous fistula ([place]) 06/20/2013  . Absolute anemia 10/19/2012  . Recurrent major depressive disorder, in partial remission ([place]) 07/13/2012  . Chronic pain 01/22/2012  . Clinical depression 01/22/2012  . Disease of lung 01/22/2012   . Fibromyalgia 01/22/2012  . LBP (low back pain) 01/22/2012  . Cannot sleep 01/22/2012  . Emotional neurotic disorder 01/22/2012  . Cannabis abuse, continuous 01/22/2012  . Pulmonary embolism with infarction ([place]) 01/22/2012  . Esophagitis, reflux 01/22/2012  . Current tobacco use 01/22/2012  . Narcotic drug use 11/02/2010  . Post-phlebitic syndrome 07/05/2010  . Borderline personality disorder ([place]) 06/15/2010  . Chronic deep vein thrombosis (DVT) of lower extremity ([place]) 06/15/2010  . Hereditary and idiopathic neuropathy 06/15/2010  . Neurosis, posttraumatic 06/15/2010    Alanson Puls, PT DPT 01/23/2017, 3:14 PM  [place] MAIN [facility] SERVICES [address] [place], [place], [address] Phone: [phone]   Fax:  [phone]  Name: Lindsey Willis MRN: [id number] Date of Birth: 10-15-1975

## 2017-02-01 ENCOUNTER — Ambulatory Visit: Payer: PPO

## 2017-02-01 DIAGNOSIS — R262 Difficulty in walking, not elsewhere classified: Secondary | ICD-10-CM

## 2017-02-01 DIAGNOSIS — M6281 Muscle weakness (generalized): Secondary | ICD-10-CM

## 2017-02-01 DIAGNOSIS — G8929 Other chronic pain: Secondary | ICD-10-CM

## 2017-02-01 DIAGNOSIS — M545 Low back pain: Secondary | ICD-10-CM | POA: Diagnosis not present

## 2017-02-01 NOTE — Therapy (Signed)
[place] MAIN [facility] SERVICES [address] [place], [place], [address] Phone: [phone]   Fax:  [phone]  Physical Therapy Treatment  Patient Details  Name: Lindsey Willis MRN: [id number] Date of Birth: 07/30/1975 Referring Provider: Fabio Bering   Encounter Date: 02/01/2017  PT End of Session - 02/01/17 1318    Visit Number  12    Number of Visits  33    Date for PT Re-Evaluation  03/07/17    PT Start Time  0945    PT Stop Time  1030    PT Time Calculation (min)  45 min       Past Medical History:  Diagnosis Date  . Anemia   . Asthma   . Borderline personality disorder ([place])   . Depression   . DVT of leg (deep venous thrombosis) ([place])   . Fibromyalgia   . May-Thurner syndrome   . PTSD (post-traumatic stress disorder)     Past Surgical History:  Procedure Laterality Date  . BREAST CYST ASPIRATION Right    neg  . CHOLECYSTECTOMY    . IVC FILTER PLACEMENT ([facility] HX)    . LEG SURGERY    . TUBAL LIGATION      There were no vitals filed for this visit.  Subjective Assessment - 02/01/17 1316    Subjective  Pt notes 7/10 pain today in the R hip/buttock. Pt reports discovering that she may be able to get a community pool membership through her insurance carrier to continue aquatic exercise independently and is encouraged by this.     Pertinent History  Pt has had chronic low back pain for 10+ years.  Pt has hx of anxiety, asthma, chronic LBP, major depressive disorder, fibromyalgia, May-Thurner Syndrome, RLS and PTSD.  Pt has had a hx of LE DVT's and pulmonary embolism with multiple stene placements and thrombolysis in B LE's with last stent placement occuring in 2012.  Pt is currently on anticoagulants. Pt has had 2 nerve blocks for leg pain in the past resulting in decreased sensation and function of L LE.  Pt also has decreased sensation of B feet.      Enters/exits pool via steps Participates in the  following  Ambulation, kick boards for UE support/posture - 6 L fwd - 2 L side   Abdominal stabilization with UE/LE at wall in stand, 2 x 10 - sh flex/ext - sh abd/add - horiz abd/add - SKTC tuck - small leg circles, cw/ccw  Bench, 4 min - bike  Stretches - ham - gastroc - SKTC - piriformis                            PT Short Term Goals - 02/01/17 1320      PT SHORT TERM GOAL #1   Title  Pt will demonstrate improved pain levels with activty to 6/10 or <.    Baseline  Worst: 9/10, Best: 6/10, 8/10 right hip pain    Time  4    Period  Weeks    Status  Partially Met      PT SHORT TERM GOAL #2   Title  Pt will improve Mod ODI to 60% or less indicating severe disability in order to demonstrate self-reported improvements of pain and mobility.    Baseline  11/15/16: 72% indicating extreme disability, 52 %    Time  8    Period  Weeks  Status  Partially Met        PT Long Term Goals - 02/01/17 1320      PT LONG TERM GOAL #1   Title  Pt will demonstrate independence with HEP in order to manage pain and other symptoms.    Baseline  Patient is in aquatic therapy and is doing some of the exercises out side of the pool    Time  8    Period  Weeks    Status  Partially Met      PT LONG TERM GOAL #2   Title  Pt will improve gait speed to at least 1.0 m/s in order to demonstrate improvements in LE strength and decreaed pain levels in order to ambulate within limited community ambulator status.    Baseline  11/15/16: 0.5 m/s, .36 m/sec; .45 sec 01/23/17    Time  8    Period  Weeks    Status  Partially Met      PT LONG TERM GOAL #3   Title  Pt will reduce time to complete 5x sit to stand to <30 seconds in order to demonstrate improved LE strength and decreaed levels of pain, demonstrating improvement in functional mobility.    Baseline  11/15/16: 50.38 sec, 50.54 sec:01/23/17 45.33 sec    Time  8    Period  Weeks    Status  Partially Met      PT  LONG TERM GOAL #4   Title  Pt will improve Mod ODI to 40% or < indicating moderate level of disability in order to demonstrate self-reported improvements in pain and mobility.    Baseline  11/15/16: 72% indicating extreme disability, 52 % ODI; 44% ODI    Time  8    Period  Weeks      PT LONG TERM GOAL #5   Title  Pt will improve LE strength to at least 4/5 in all limited planes in order to improve mobility, gait, and ADL ability.    Baseline  Gross strength assessment: 3/5 - 2/5 in L hip blex and B hip ext    Time  8    Period  Weeks    Status  Partially Met            Plan - 02/01/17 1319    Clinical Impression Statement  Participated in all exercises well. Verbal cues for ensuring abdominal stabilization with wall exercises.     Rehab Potential  Fair    Clinical Impairments Affecting Rehab Potential  age, perception of dx, comorbidities    PT Frequency  2x / week    PT Duration  8 weeks    PT Treatment/Interventions  ADLs/Self Care Home Management;Aquatic Therapy;Cryotherapy;Moist Heat;Traction;Ultrasound;Electrical Stimulation;Gait training;Stair training;Functional mobility training;Therapeutic activities;Therapeutic exercise;Balance training;Neuromuscular re-education;Patient/family education;Manual techniques;Passive range of motion;Dry needling;Taping    PT Next Visit Plan  begin aquatic therapy    PT Home Exercise Plan  abdominal bracing in hooklying     Consulted and Agree with Plan of Care  Patient       Patient will benefit from skilled therapeutic intervention in order to improve the following deficits and impairments:  Abnormal gait, Decreased activity tolerance, Decreased endurance, Decreased mobility, Decreased range of motion, Decreased strength, Difficulty walking, Hypomobility, Increased muscle spasms, Pain  Visit Diagnosis: Chronic midline low back pain, with sciatica presence unspecified  Muscle weakness (generalized)  Difficulty in walking, not elsewhere  classified  Chronic right-sided low back pain, with sciatica presence unspecified  Problem List Patient Active Problem List   Diagnosis Date Noted  . Deep vein thrombosis (DVT) ([place]) 04/08/2015  . May-Thurner syndrome 04/08/2015  . Overdose 02/09/2015  . Ache in joint 11/21/2013  . Arteriovenous fistula ([place]) 06/20/2013  . Absolute anemia 10/19/2012  . Recurrent major depressive disorder, in partial remission ([place]) 07/13/2012  . Chronic pain 01/22/2012  . Clinical depression 01/22/2012  . Disease of lung 01/22/2012  . Fibromyalgia 01/22/2012  . LBP (low back pain) 01/22/2012  . Cannot sleep 01/22/2012  . Emotional neurotic disorder 01/22/2012  . Cannabis abuse, continuous 01/22/2012  . Pulmonary embolism with infarction ([place]) 01/22/2012  . Esophagitis, reflux 01/22/2012  . Current tobacco use 01/22/2012  . Narcotic drug use 11/02/2010  . Post-phlebitic syndrome 07/05/2010  . Borderline personality disorder ([place]) 06/15/2010  . Chronic deep vein thrombosis (DVT) of lower extremity ([place]) 06/15/2010  . Hereditary and idiopathic neuropathy 06/15/2010  . Neurosis, posttraumatic 06/15/2010    Larae Grooms 02/01/2017, 1:21 PM  [place] MAIN [facility] SERVICES [address] [place], [place], [address] Phone: [phone]   Fax:  [phone]  Name: TEMPRENCE RHINES MRN: [id number] Date of Birth: [birth year]

## 2017-02-08 ENCOUNTER — Ambulatory Visit: Payer: PPO

## 2017-02-13 ENCOUNTER — Ambulatory Visit: Payer: PPO | Attending: Pain Medicine

## 2017-02-13 ENCOUNTER — Other Ambulatory Visit: Payer: Self-pay

## 2017-02-13 DIAGNOSIS — R262 Difficulty in walking, not elsewhere classified: Secondary | ICD-10-CM

## 2017-02-13 DIAGNOSIS — M545 Low back pain: Secondary | ICD-10-CM | POA: Diagnosis not present

## 2017-02-13 DIAGNOSIS — M6281 Muscle weakness (generalized): Secondary | ICD-10-CM | POA: Insufficient documentation

## 2017-02-13 DIAGNOSIS — G8929 Other chronic pain: Secondary | ICD-10-CM | POA: Insufficient documentation

## 2017-02-13 NOTE — Therapy (Signed)
[place] MAIN [facility] SERVICES [address] [place], [place], [address] Phone: [phone]   Fax:  [phone]  Physical Therapy Treatment  Patient Details  Name: Lindsey Willis MRN: [id number] Date of Birth: 05-17-1975 Referring Provider: Fabio Bering   Encounter Date: 02/13/2017  PT End of Session - 02/13/17 1349    Visit Number  13    Number of Visits  33    Date for PT Re-Evaluation  03/07/17    PT Start Time  0905    PT Stop Time  0950    PT Time Calculation (min)  45 min       Past Medical History:  Diagnosis Date  . Anemia   . Asthma   . Borderline personality disorder ([place])   . Depression   . DVT of leg (deep venous thrombosis) ([place])   . Fibromyalgia   . May-Thurner syndrome   . PTSD (post-traumatic stress disorder)     Past Surgical History:  Procedure Laterality Date  . BREAST CYST ASPIRATION Right    neg  . CHOLECYSTECTOMY    . IVC FILTER PLACEMENT ([facility] HX)    . LEG SURGERY    . TUBAL LIGATION      There were no vitals filed for this visit.  Subjective Assessment - 02/13/17 1347    Subjective  Pt reports 6/10 pain today in R hip/buttock and LB. Pt does note less pain while in the water with gravity omitted.     Pertinent History  Pt has had chronic low back pain for 10+ years.  Pt has hx of anxiety, asthma, chronic LBP, major depressive disorder, fibromyalgia, May-Thurner Syndrome, RLS and PTSD.  Pt has had a hx of LE DVT's and pulmonary embolism with multiple stene placements and thrombolysis in B LE's with last stent placement occuring in 2012.  Pt is currently on anticoagulants. Pt has had 2 nerve blocks for leg pain in the past resulting in decreased sensation and function of L LE.  Pt also has decreased sensation of B feet.      Pt enters/exits pool via steps Participates in the following.   Ambulation - 6 L fwd - 1 L, left lead  Abdominal stabilization with UE work, 3 x 10  - triceps press  down with green dumbells - flex/ext - abd/add - horiz abd/add  Bench abdominal stabilization - single knee tucks, 2 x 10 each - alternating march, 20x - SLR, 2 x 10 each  Squats - 30x  Stretching, 3 x 15 seconds - hamstrings - gastrocs - SKTC - piriformis                        PT Education - 02/13/17 1349    Education provided  Yes    Education Details  Re educated on squats       PT Short Term Goals - 02/13/17 1353      PT SHORT TERM GOAL #1   Title  Pt will demonstrate improved pain levels with activty to 6/10 or <.    Baseline  Worst: 9/10, Best: 6/10, 8/10 right hip pain    Time  4    Period  Weeks    Status  Partially Met      PT SHORT TERM GOAL #2   Title  Pt will improve Mod ODI to 60% or less indicating severe disability in order to demonstrate self-reported improvements of pain and mobility.  Baseline  11/15/16: 72% indicating extreme disability, 52 %    Time  8    Period  Weeks    Status  Partially Met        PT Long Term Goals - 02/13/17 1353      PT LONG TERM GOAL #1   Title  Pt will demonstrate independence with HEP in order to manage pain and other symptoms.    Baseline  Patient is in aquatic therapy and is doing some of the exercises out side of the pool    Time  8    Period  Weeks    Status  Partially Met      PT LONG TERM GOAL #2   Title  Pt will improve gait speed to at least 1.0 m/s in order to demonstrate improvements in LE strength and decreaed pain levels in order to ambulate within limited community ambulator status.    Baseline  11/15/16: 0.5 m/s, .36 m/sec; .45 sec 01/23/17    Time  8    Period  Weeks    Status  Partially Met      PT LONG TERM GOAL #3   Title  Pt will reduce time to complete 5x sit to stand to <30 seconds in order to demonstrate improved LE strength and decreaed levels of pain, demonstrating improvement in functional mobility.    Baseline  11/15/16: 50.38 sec, 50.54 sec:01/23/17 45.33 sec     Time  8    Period  Weeks    Status  Partially Met      PT LONG TERM GOAL #4   Title  Pt will improve Mod ODI to 40% or < indicating moderate level of disability in order to demonstrate self-reported improvements in pain and mobility.    Baseline  11/15/16: 72% indicating extreme disability, 52 % ODI; 44% ODI    Time  8    Period  Weeks      PT LONG TERM GOAL #5   Title  Pt will improve LE strength to at least 4/5 in all limited planes in order to improve mobility, gait, and ADL ability.    Baseline  Gross strength assessment: 3/5 - 2/5 in L hip blex and B hip ext    Time  8    Period  Weeks    Status  Partially Met            Plan - 02/13/17 1350    Clinical Impression Statement  Pt participates well in all exercises Cues for maintaining abdominal stabilization during exercises. Pt unable to tolerate side stepping well especially to the right; therefore, deferred.     Rehab Potential  Fair    Clinical Impairments Affecting Rehab Potential  age, perception of dx, comorbidities    PT Frequency  2x / week    PT Duration  8 weeks    PT Treatment/Interventions  ADLs/Self Care Home Management;Aquatic Therapy;Cryotherapy;Moist Heat;Traction;Ultrasound;Electrical Stimulation;Gait training;Stair training;Functional mobility training;Therapeutic activities;Therapeutic exercise;Balance training;Neuromuscular re-education;Patient/family education;Manual techniques;Passive range of motion;Dry needling;Taping    PT Next Visit Plan  begin aquatic therapy    PT Home Exercise Plan  abdominal bracing in hooklying     Consulted and Agree with Plan of Care  Patient       Patient will benefit from skilled therapeutic intervention in order to improve the following deficits and impairments:  Abnormal gait, Decreased activity tolerance, Decreased endurance, Decreased mobility, Decreased range of motion, Decreased strength, Difficulty walking, Hypomobility, Increased muscle spasms, Pain  Visit  Diagnosis: Chronic midline low back pain, with sciatica presence unspecified  Muscle weakness (generalized)  Difficulty in walking, not elsewhere classified  Chronic right-sided low back pain, with sciatica presence unspecified     Problem List Patient Active Problem List   Diagnosis Date Noted  . Deep vein thrombosis (DVT) ([place]) 04/08/2015  . May-Thurner syndrome 04/08/2015  . Overdose 02/09/2015  . Ache in joint 11/21/2013  . Arteriovenous fistula ([place]) 06/20/2013  . Absolute anemia 10/19/2012  . Recurrent major depressive disorder, in partial remission ([place]) 07/13/2012  . Chronic pain 01/22/2012  . Clinical depression 01/22/2012  . Disease of lung 01/22/2012  . Fibromyalgia 01/22/2012  . LBP (low back pain) 01/22/2012  . Cannot sleep 01/22/2012  . Emotional neurotic disorder 01/22/2012  . Cannabis abuse, continuous 01/22/2012  . Pulmonary embolism with infarction ([place]) 01/22/2012  . Esophagitis, reflux 01/22/2012  . Current tobacco use 01/22/2012  . Narcotic drug use 11/02/2010  . Post-phlebitic syndrome 07/05/2010  . Borderline personality disorder ([place]) 06/15/2010  . Chronic deep vein thrombosis (DVT) of lower extremity ([place]) 06/15/2010  . Hereditary and idiopathic neuropathy 06/15/2010  . Neurosis, posttraumatic 06/15/2010    Larae Grooms 02/13/2017, 1:55 PM  [place] MAIN [facility] SERVICES [address] [place], [place], [address] Phone: [phone]   Fax:  [phone]  Name: Lindsey Willis MRN: [id number] Date of Birth: 23-Nov-1975

## 2017-02-20 ENCOUNTER — Other Ambulatory Visit: Payer: Self-pay

## 2017-02-20 ENCOUNTER — Ambulatory Visit: Payer: PPO

## 2017-02-20 DIAGNOSIS — R262 Difficulty in walking, not elsewhere classified: Secondary | ICD-10-CM

## 2017-02-20 DIAGNOSIS — G8929 Other chronic pain: Secondary | ICD-10-CM

## 2017-02-20 DIAGNOSIS — M545 Low back pain: Secondary | ICD-10-CM | POA: Diagnosis not present

## 2017-02-20 DIAGNOSIS — M6281 Muscle weakness (generalized): Secondary | ICD-10-CM

## 2017-02-20 NOTE — Therapy (Signed)
[employer] MAIN [facility] SERVICES [address] [place], [place], [address] Phone: [phone]   Fax:  [phone]  Physical Therapy Treatment  Patient Details  Name: Lindsey Willis MRN: [id number] Date of Birth: [birth year] Referring Provider: Fabio Bering   Encounter Date: 02/20/2017  PT End of Session - 02/20/17 1407    Visit Number  14    Number of Visits  33    Date for PT Re-Evaluation  03/07/17    PT Start Time  0910    PT Stop Time  1010    PT Time Calculation (min)  60 min    Activity Tolerance  Patient tolerated treatment well    Behavior During Therapy  [facility] for tasks assessed/performed       Past Medical History:  Diagnosis Date  . Anemia   . Asthma   . Borderline personality disorder ([place])   . Depression   . DVT of leg (deep venous thrombosis) ([place])   . Fibromyalgia   . May-Thurner syndrome   . PTSD (post-traumatic stress disorder)     Past Surgical History:  Procedure Laterality Date  . BREAST CYST ASPIRATION Right    neg  . CHOLECYSTECTOMY    . IVC FILTER PLACEMENT ([facility] HX)    . LEG SURGERY    . TUBAL LIGATION      There were no vitals filed for this visit.  Subjective Assessment - 02/20/17 1403    Subjective  Pt reports pain increased to a 7/10 today; feels it is due to the cold rainy weather. Pt describes pain as a backwards "C" shape on R buttock in deep in R hip; no radiating pain away from those areas.     Pertinent History  Pt has had chronic low back pain for 10+ years.  Pt has hx of anxiety, asthma, chronic LBP, major depressive disorder, fibromyalgia, May-Thurner Syndrome, RLS and PTSD.  Pt has had a hx of LE DVT's and pulmonary embolism with multiple stene placements and thrombolysis in B LE's with last stent placement occuring in 2012.  Pt is currently on anticoagulants. Pt has had 2 nerve blocks for leg pain in the past resulting in decreased sensation and function of L LE.  Pt also has decreased  sensation of B feet.      Enters/exits pool via steps Participates in the following  Ambulation with kick boards for UE support/posture - 6 L fwd  Abdominal stabilization at wall with UE work; straight stand, sh's in water - Triceps press down with dumbells, 3 x 10  - sh ABD/ADD with dumbbells, 3 x 10  - sh flex/ext, 1 dumbbell, 3 x 10    With LE work - B SKTC mini tuck, 3 x 10 - B outward small hip circles, 3 x 10   Bench abdominal stabilization - Bike 2 x 1 min - DKTC, 2 x 10  - B single leg hip flex to ABD, 10x ea  Rail - Deep squats, 30x  Stretching, 3 x 15 sec ea - ham/gastroc - SKTC - Piriformis, cross body technique  Educated on piriformis release positioning for home                        PT Education - 02/20/17 1405    Education provided  Yes    Education Details  Deep squat for water only; instructed on MINI squat on land, progression of seated abdominal stab with LE work.  Position of release for R piriformis to perform at home.     Person(s) Educated  Patient       PT Short Term Goals - 02/20/17 1410      PT SHORT TERM GOAL #1   Title  Pt will demonstrate improved pain levels with activty to 6/10 or <.    Baseline  Worst: 9/10, Best: 6/10, 8/10 right hip pain    Time  4    Period  Weeks    Status  Partially Met      PT SHORT TERM GOAL #2   Title  Pt will improve Mod ODI to 60% or less indicating severe disability in order to demonstrate self-reported improvements of pain and mobility.    Baseline  11/15/16: 72% indicating extreme disability, 52 %    Time  8    Period  Weeks    Status  Partially Met        PT Long Term Goals - 02/20/17 1410      PT LONG TERM GOAL #1   Title  Pt will demonstrate independence with HEP in order to manage pain and other symptoms.    Baseline  Patient is in aquatic therapy and is doing some of the exercises out side of the pool    Time  8    Period  Weeks    Status  Partially Met      PT  LONG TERM GOAL #2   Title  Pt will improve gait speed to at least 1.0 m/s in order to demonstrate improvements in LE strength and decreaed pain levels in order to ambulate within limited community ambulator status.    Baseline  11/15/16: 0.5 m/s, .36 m/sec; .45 sec 01/23/17    Time  8    Period  Weeks    Status  Partially Met      PT LONG TERM GOAL #3   Title  Pt will reduce time to complete 5x sit to stand to <30 seconds in order to demonstrate improved LE strength and decreaed levels of pain, demonstrating improvement in functional mobility.    Baseline  11/15/16: 50.38 sec, 50.54 sec:01/23/17 45.33 sec    Time  8    Period  Weeks    Status  Partially Met      PT LONG TERM GOAL #4   Title  Pt will improve Mod ODI to 40% or < indicating moderate level of disability in order to demonstrate self-reported improvements in pain and mobility.    Baseline  11/15/16: 72% indicating extreme disability, 52 % ODI; 44% ODI    Time  8    Period  Weeks      PT LONG TERM GOAL #5   Title  Pt will improve LE strength to at least 4/5 in all limited planes in order to improve mobility, gait, and ADL ability.    Baseline  Gross strength assessment: 3/5 - 2/5 in L hip blex and B hip ext    Time  8    Period  Weeks    Status  Partially Met            Plan - 02/20/17 1407    Clinical Impression Statement  Pt participated with progression of exercises well; no increased pain during or immediately afterward. Performs abdominal stab exercises best in sets of 10    Rehab Potential  Fair    Clinical Impairments Affecting Rehab Potential  age, perception of dx, comorbidities  PT Frequency  2x / week    PT Duration  8 weeks    PT Treatment/Interventions  ADLs/Self Care Home Management;Aquatic Therapy;Cryotherapy;Moist Heat;Traction;Ultrasound;Electrical Stimulation;Gait training;Stair training;Functional mobility training;Therapeutic activities;Therapeutic exercise;Balance training;Neuromuscular  re-education;Patient/family education;Manual techniques;Passive range of motion;Dry needling;Taping    PT Next Visit Plan  begin aquatic therapy    PT Home Exercise Plan  abdominal bracing in hooklying     Consulted and Agree with Plan of Care  Patient       Patient will benefit from skilled therapeutic intervention in order to improve the following deficits and impairments:  Abnormal gait, Decreased activity tolerance, Decreased endurance, Decreased mobility, Decreased range of motion, Decreased strength, Difficulty walking, Hypomobility, Increased muscle spasms, Pain  Visit Diagnosis: Chronic midline low back pain, with sciatica presence unspecified  Muscle weakness (generalized)  Difficulty in walking, not elsewhere classified  Chronic right-sided low back pain, with sciatica presence unspecified     Problem List Patient Active Problem List   Diagnosis Date Noted  . Deep vein thrombosis (DVT) ([place]) 04/08/2015  . May-Thurner syndrome 04/08/2015  . Overdose 02/09/2015  . Ache in joint 11/21/2013  . Arteriovenous fistula ([place]) 06/20/2013  . Absolute anemia 10/19/2012  . Recurrent major depressive disorder, in partial remission ([place]) 07/13/2012  . Chronic pain 01/22/2012  . Clinical depression 01/22/2012  . Disease of lung 01/22/2012  . Fibromyalgia 01/22/2012  . LBP (low back pain) 01/22/2012  . Cannot sleep 01/22/2012  . Emotional neurotic disorder 01/22/2012  . Cannabis abuse, continuous 01/22/2012  . Pulmonary embolism with infarction ([place]) 01/22/2012  . Esophagitis, reflux 01/22/2012  . Current tobacco use 01/22/2012  . Narcotic drug use 11/02/2010  . Post-phlebitic syndrome 07/05/2010  . Borderline personality disorder ([place]) 06/15/2010  . Chronic deep vein thrombosis (DVT) of lower extremity ([place]) 06/15/2010  . Hereditary and idiopathic neuropathy 06/15/2010  . Neurosis, posttraumatic 06/15/2010    Larae Grooms 02/20/2017, 2:11 PM  [place] MAIN [facility] SERVICES [address] [place], [place], [address] Phone: [phone]   Fax:  [phone]  Name: Lindsey Willis MRN: [id number] Date of Birth: [birth year]

## 2017-02-27 ENCOUNTER — Ambulatory Visit: Payer: PPO | Admitting: Physical Therapy

## 2017-03-01 ENCOUNTER — Encounter: Payer: Self-pay | Admitting: Physical Therapy

## 2017-03-01 ENCOUNTER — Ambulatory Visit: Payer: PPO | Admitting: Physical Therapy

## 2017-03-01 DIAGNOSIS — M545 Low back pain: Secondary | ICD-10-CM | POA: Diagnosis not present

## 2017-03-01 DIAGNOSIS — R262 Difficulty in walking, not elsewhere classified: Secondary | ICD-10-CM

## 2017-03-01 DIAGNOSIS — M6281 Muscle weakness (generalized): Secondary | ICD-10-CM

## 2017-03-01 DIAGNOSIS — G8929 Other chronic pain: Secondary | ICD-10-CM

## 2017-03-01 NOTE — Therapy (Signed)
[place] MAIN [facility] SERVICES [address] [place], [place], [address] Phone: [phone]   Fax:  [phone]  Physical Therapy Treatment  Patient Details  Name: Lindsey Willis MRN: [id number] Date of Birth: [birth year] Referring Provider: Fabio Bering   Encounter Date: 03/01/2017  PT End of Session - 03/01/17 1356    Visit Number  15    Number of Visits  33    Date for PT Re-Evaluation  03/07/17    PT Start Time  0145    PT Stop Time  0225    PT Time Calculation (min)  40 min    Equipment Utilized During Treatment  Gait belt    Activity Tolerance  Patient tolerated treatment well    Behavior During Therapy  [facility] for tasks assessed/performed       Past Medical History:  Diagnosis Date  . Anemia   . Asthma   . Borderline personality disorder ([place])   . Depression   . DVT of leg (deep venous thrombosis) ([place])   . Fibromyalgia   . May-Thurner syndrome   . PTSD (post-traumatic stress disorder)     Past Surgical History:  Procedure Laterality Date  . BREAST CYST ASPIRATION Right    neg  . CHOLECYSTECTOMY    . IVC FILTER PLACEMENT ([facility] HX)    . LEG SURGERY    . TUBAL LIGATION      There were no vitals filed for this visit.  Subjective Assessment - 03/01/17 1355    Subjective  Pt reports pain increased to a 7/10 today; feels it is due to the cold rainy weather. Pt describes pain as a backwards "C" shape on R buttock in deep in R hip; no radiating pain away from those areas.     Pertinent History  Pt has had chronic low back pain for 10+ years.  Pt has hx of anxiety, asthma, chronic LBP, major depressive disorder, fibromyalgia, May-Thurner Syndrome, RLS and PTSD.  Pt has had a hx of LE DVT's and pulmonary embolism with multiple stene placements and thrombolysis in B LE's with last stent placement occuring in 2012.  Pt is currently on anticoagulants. Pt has had 2 nerve blocks for leg pain in the past resulting in decreased  sensation and function of L LE.  Pt also has decreased sensation of B feet.    Limitations  Sitting;Walking;Writing;Lifting;House hold activities;Standing    How long can you sit comfortably?  5-10 minutes     How long can you stand comfortably?  <5 minutes     How long can you walk comfortably?  5-10 minutes    Diagnostic tests  none    Patient Stated Goals  "reduce overall pain"    Currently in Pain?  Yes    Pain Score  7     Pain Location  Hip    Pain Orientation  Right    Pain Descriptors / Indicators  Aching    Pain Type  Chronic pain    Pain Onset  More than a month ago    Pain Frequency  Constant    Aggravating Factors   standing , sitting, walking    Pain Relieving Factors  medication    Effect of Pain on Daily Activities  decrease in activities    Multiple Pain Sites  No        Out come measures performed : 5 x sit to stand, 10 MW, ODI, strength testing, pain assessed, and goals assessed  with recommendation to continue PT for 8 weeks for strengthening .                        PT Education - 03/01/17 1356    Education provided  Yes    Education Details  outcome measures and goals and plan of care    Person(s) Educated  Patient    Methods  Explanation    Comprehension  Verbalized understanding       PT Short Term Goals - 02/20/17 1410      PT SHORT TERM GOAL #1   Title  Pt will demonstrate improved pain levels with activty to 6/10 or <.    Baseline  Worst: 9/10, Best: 6/10, 8/10 right hip pain    Time  4    Period  Weeks    Status  Partially Met      PT SHORT TERM GOAL #2   Title  Pt will improve Mod ODI to 60% or less indicating severe disability in order to demonstrate self-reported improvements of pain and mobility.    Baseline  11/15/16: 72% indicating extreme disability, 52 %    Time  8    Period  Weeks    Status  Partially Met        PT Long Term Goals - 03/01/17 1357      PT LONG TERM GOAL #1   Title  Pt will demonstrate  independence with HEP in order to manage pain and other symptoms.    Baseline  Patient is in aquatic therapy and is doing some of the exercises out side of the pool    Time  8    Period  Weeks    Status  Partially Met    Target Date  04/26/17      PT LONG TERM GOAL #2   Title  Pt will improve gait speed to at least 1.0 m/s in order to demonstrate improvements in LE strength and decreaed pain levels in order to ambulate within limited community ambulator status.    Baseline  11/15/16: 0.5 m/s, .36 m/sec; .45 sec 01/23/17, .41 m/sec    Time  8    Period  Weeks    Status  Partially Met    Target Date  04/26/17      PT LONG TERM GOAL #3   Title  Pt will reduce time to complete 5x sit to stand to <30 seconds in order to demonstrate improved LE strength and decreaed levels of pain, demonstrating improvement in functional mobility.    Baseline  11/15/16: 50.38 sec, 50.54 sec:01/23/17 45.33 sec, 41.66 sec    Time  8    Period  Weeks    Status  Partially Met    Target Date  04/26/17      PT LONG TERM GOAL #4   Title  Pt will improve Mod ODI to 40% or < indicating moderate level of disability in order to demonstrate self-reported improvements in pain and mobility.    Baseline  11/15/16: 72% indicating extreme disability, 52 % ODI; 44% ODI, 48% indicating moderate disability    Time  8    Period  Weeks    Status  Partially Met    Target Date  04/26/17      PT LONG TERM GOAL #5   Title  Pt will improve LE strength to at least 4/5 in all limited planes in order to improve mobility, gait, and ADL ability.  Baseline  Gross strength assessment: 3/5 - 2/5 in L hip blex and B hip ext    Time  8    Period  Weeks    Status  Partially Met    Target Date  04/26/17            Plan - 03/01/17 1408    Clinical Impression Statement  Patient continues to have ODI in moderate disability and her outcome measures remainied almost the same with small improvements and goals are continuing to be  partially met. She will continue to benefit from skilled PT to improve moblity and set up HEP for following DC in 8 weeks.     Rehab Potential  Fair    Clinical Impairments Affecting Rehab Potential  age, perception of dx, comorbidities    PT Frequency  2x / week    PT Duration  8 weeks    PT Treatment/Interventions  ADLs/Self Care Home Management;Aquatic Therapy;Cryotherapy;Moist Heat;Traction;Ultrasound;Electrical Stimulation;Gait training;Stair training;Functional mobility training;Therapeutic activities;Therapeutic exercise;Balance training;Neuromuscular re-education;Patient/family education;Manual techniques;Passive range of motion;Dry needling;Taping    PT Next Visit Plan  begin aquatic therapy    PT Home Exercise Plan  abdominal bracing in hooklying     Consulted and Agree with Plan of Care  Patient       Patient will benefit from skilled therapeutic intervention in order to improve the following deficits and impairments:  Abnormal gait, Decreased activity tolerance, Decreased endurance, Decreased mobility, Decreased range of motion, Decreased strength, Difficulty walking, Hypomobility, Increased muscle spasms, Pain  Visit Diagnosis: Chronic midline low back pain, with sciatica presence unspecified  Muscle weakness (generalized)  Difficulty in walking, not elsewhere classified  Chronic right-sided low back pain, with sciatica presence unspecified     Problem List Patient Active Problem List   Diagnosis Date Noted  . Deep vein thrombosis (DVT) ([place]) 04/08/2015  . May-Thurner syndrome 04/08/2015  . Overdose 02/09/2015  . Ache in joint 11/21/2013  . Arteriovenous fistula ([place]) 06/20/2013  . Absolute anemia 10/19/2012  . Recurrent major depressive disorder, in partial remission ([place]) 07/13/2012  . Chronic pain 01/22/2012  . Clinical depression 01/22/2012  . Disease of lung 01/22/2012  . Fibromyalgia 01/22/2012  . LBP (low back pain) 01/22/2012  . Cannot sleep 01/22/2012   . Emotional neurotic disorder 01/22/2012  . Cannabis abuse, continuous 01/22/2012  . Pulmonary embolism with infarction ([place]) 01/22/2012  . Esophagitis, reflux 01/22/2012  . Current tobacco use 01/22/2012  . Narcotic drug use 11/02/2010  . Post-phlebitic syndrome 07/05/2010  . Borderline personality disorder ([place]) 06/15/2010  . Chronic deep vein thrombosis (DVT) of lower extremity ([place]) 06/15/2010  . Hereditary and idiopathic neuropathy 06/15/2010  . Neurosis, posttraumatic 06/15/2010    Alanson Puls, PT DPT 03/01/2017, 2:18 PM  [place] MAIN [facility] SERVICES [address] [place], [place], [address] Phone: [phone]   Fax:  [phone]  Name: Lindsey Willis MRN: [id number] Date of Birth: [birth year]

## 2017-03-06 ENCOUNTER — Ambulatory Visit: Payer: PPO

## 2017-03-06 ENCOUNTER — Other Ambulatory Visit: Payer: Self-pay

## 2017-03-06 DIAGNOSIS — M545 Low back pain: Secondary | ICD-10-CM | POA: Diagnosis not present

## 2017-03-06 DIAGNOSIS — R262 Difficulty in walking, not elsewhere classified: Secondary | ICD-10-CM

## 2017-03-06 DIAGNOSIS — M6281 Muscle weakness (generalized): Secondary | ICD-10-CM

## 2017-03-06 DIAGNOSIS — G8929 Other chronic pain: Secondary | ICD-10-CM

## 2017-03-06 NOTE — Therapy (Signed)
[place] MAIN [facility] SERVICES [address] [place], [place], [address] Phone: [phone]   Fax:  [phone]  Physical Therapy Treatment  Patient Details  Name: Lindsey Willis MRN: [id number] Date of Birth: 05/09/1975 Referring Provider: Fabio Bering   Encounter Date: 03/06/2017  PT End of Session - 03/06/17 1311    Visit Number  16    Number of Visits  33    Date for PT Re-Evaluation  03/07/17    PT Start Time  1045    PT Stop Time  1145    PT Time Calculation (min)  60 min    Activity Tolerance  Patient tolerated treatment well    Behavior During Therapy  [facility] for tasks assessed/performed       Past Medical History:  Diagnosis Date  . Anemia   . Asthma   . Borderline personality disorder ([place])   . Depression   . DVT of leg (deep venous thrombosis) ([place])   . Fibromyalgia   . May-Thurner syndrome   . PTSD (post-traumatic stress disorder)     Past Surgical History:  Procedure Laterality Date  . BREAST CYST ASPIRATION Right    neg  . CHOLECYSTECTOMY    . IVC FILTER PLACEMENT ([facility] HX)    . LEG SURGERY    . TUBAL LIGATION      There were no vitals filed for this visit.  Subjective Assessment - 03/06/17 1308    Subjective  Pt reports 6/10 pain today, so feeling a bit better through the R hip/buttock overall. Pt notes compliance with position of release for R hip ER's, which is helping pain especially at night.     Pertinent History  Pt has had chronic low back pain for 10+ years.  Pt has hx of anxiety, asthma, chronic LBP, major depressive disorder, fibromyalgia, May-Thurner Syndrome, RLS and PTSD.  Pt has had a hx of LE DVT's and pulmonary embolism with multiple stene placements and thrombolysis in B LE's with last stent placement occuring in 2012.  Pt is currently on anticoagulants. Pt has had 2 nerve blocks for leg pain in the past resulting in decreased sensation and function of L LE.  Pt also has decreased sensation  of B feet.       Pt enters/exits via ramp Participates in the following  Ambulation with kick boards - 4 L fwd - 4 L side  Core stabilization with LE work - Radio broadcast assistant, 30x  - Hip flex/ext R/L, 20x ea - Hip abd/add R/L, 20x ea  Bench core stabilization with LE work - SKTC mini tucks, R/L 2 x 10 ea - SLR to ABD (up/down, out/in = 1 rep) R/L 10x ea  Stretching, R/L 3 x 15 sec each - Ham/gastroc (SL long sit tech)  - SKTC - Piriformis, cross body - Piriformis/groin, Figure 4 form                       PT Education - 03/06/17 1310    Education provided  Yes    Education Details  Re educated on standing core exercises with LE work. Figure 4 hip rotator/groin stretch in seated    Person(s) Educated  Patient    Methods  Explanation;Demonstration;Verbal cues    Comprehension  Verbalized understanding;Returned demonstration       PT Short Term Goals - 02/20/17 1410      PT SHORT TERM GOAL #1   Title  Pt will demonstrate improved  pain levels with activty to 6/10 or <.    Baseline  Worst: 9/10, Best: 6/10, 8/10 right hip pain    Time  4    Period  Weeks    Status  Partially Met      PT SHORT TERM GOAL #2   Title  Pt will improve Mod ODI to 60% or less indicating severe disability in order to demonstrate self-reported improvements of pain and mobility.    Baseline  11/15/16: 72% indicating extreme disability, 52 %    Time  8    Period  Weeks    Status  Partially Met        PT Long Term Goals - 03/01/17 1357      PT LONG TERM GOAL #1   Title  Pt will demonstrate independence with HEP in order to manage pain and other symptoms.    Baseline  Patient is in aquatic therapy and is doing some of the exercises out side of the pool    Time  8    Period  Weeks    Status  Partially Met    Target Date  04/26/17      PT LONG TERM GOAL #2   Title  Pt will improve gait speed to at least 1.0 m/s in order to demonstrate improvements in LE strength and decreaed pain  levels in order to ambulate within limited community ambulator status.    Baseline  11/15/16: 0.5 m/s, .36 m/sec; .45 sec 01/23/17, .41 m/sec    Time  8    Period  Weeks    Status  Partially Met    Target Date  04/26/17      PT LONG TERM GOAL #3   Title  Pt will reduce time to complete 5x sit to stand to <30 seconds in order to demonstrate improved LE strength and decreaed levels of pain, demonstrating improvement in functional mobility.    Baseline  11/15/16: 50.38 sec, 50.54 sec:01/23/17 45.33 sec, 41.66 sec    Time  8    Period  Weeks    Status  Partially Met    Target Date  04/26/17      PT LONG TERM GOAL #4   Title  Pt will improve Mod ODI to 40% or < indicating moderate level of disability in order to demonstrate self-reported improvements in pain and mobility.    Baseline  11/15/16: 72% indicating extreme disability, 52 % ODI; 44% ODI, 48% indicating moderate disability    Time  8    Period  Weeks    Status  Partially Met    Target Date  04/26/17      PT LONG TERM GOAL #5   Title  Pt will improve LE strength to at least 4/5 in all limited planes in order to improve mobility, gait, and ADL ability.    Baseline  Gross strength assessment: 3/5 - 2/5 in L hip blex and B hip ext    Time  8    Period  Weeks    Status  Partially Met    Target Date  04/26/17            Plan - 03/06/17 1312    Clinical Impression Statement  Pt tolerated increased LE work with core stabilization exercises this session. Also able to tolerate 4 L of sidestepping during ambulation, which pt has been unable to do for some time without aggravating symptoms.     Rehab Potential  Fair    Clinical  Impairments Affecting Rehab Potential  age, perception of dx, comorbidities    PT Frequency  2x / week    PT Duration  8 weeks    PT Treatment/Interventions  ADLs/Self Care Home Management;Aquatic Therapy;Cryotherapy;Moist Heat;Traction;Ultrasound;Electrical Stimulation;Gait training;Stair  training;Functional mobility training;Therapeutic activities;Therapeutic exercise;Balance training;Neuromuscular re-education;Patient/family education;Manual techniques;Passive range of motion;Dry needling;Taping    PT Next Visit Plan  begin aquatic therapy    PT Home Exercise Plan  abdominal bracing in hooklying     Consulted and Agree with Plan of Care  Patient       Patient will benefit from skilled therapeutic intervention in order to improve the following deficits and impairments:  Abnormal gait, Decreased activity tolerance, Decreased endurance, Decreased mobility, Decreased range of motion, Decreased strength, Difficulty walking, Hypomobility, Increased muscle spasms, Pain  Visit Diagnosis: Chronic midline low back pain, with sciatica presence unspecified  Muscle weakness (generalized)  Difficulty in walking, not elsewhere classified  Chronic right-sided low back pain, with sciatica presence unspecified     Problem List Patient Active Problem List   Diagnosis Date Noted  . Deep vein thrombosis (DVT) ([place]) 04/08/2015  . May-Thurner syndrome 04/08/2015  . Overdose 02/09/2015  . Ache in joint 11/21/2013  . Arteriovenous fistula ([place]) 06/20/2013  . Absolute anemia 10/19/2012  . Recurrent major depressive disorder, in partial remission ([place]) 07/13/2012  . Chronic pain 01/22/2012  . Clinical depression 01/22/2012  . Disease of lung 01/22/2012  . Fibromyalgia 01/22/2012  . LBP (low back pain) 01/22/2012  . Cannot sleep 01/22/2012  . Emotional neurotic disorder 01/22/2012  . Cannabis abuse, continuous 01/22/2012  . Pulmonary embolism with infarction ([place]) 01/22/2012  . Esophagitis, reflux 01/22/2012  . Current tobacco use 01/22/2012  . Narcotic drug use 11/02/2010  . Post-phlebitic syndrome 07/05/2010  . Borderline personality disorder ([place]) 06/15/2010  . Chronic deep vein thrombosis (DVT) of lower extremity ([place]) 06/15/2010  . Hereditary and idiopathic neuropathy  06/15/2010  . Neurosis, posttraumatic 06/15/2010    Lindsey Willis 03/06/2017, 1:15 PM  [place] MAIN [facility] SERVICES [address] [place], [place], [address] Phone: [phone]   Fax:  [phone]  Name: Lindsey Willis MRN: [id number] Date of Birth: 1975/06/25

## 2017-03-07 DIAGNOSIS — G8929 Other chronic pain: Secondary | ICD-10-CM | POA: Diagnosis not present

## 2017-03-07 DIAGNOSIS — Z1231 Encounter for screening mammogram for malignant neoplasm of breast: Secondary | ICD-10-CM | POA: Diagnosis not present

## 2017-03-07 DIAGNOSIS — B9689 Other specified bacterial agents as the cause of diseases classified elsewhere: Secondary | ICD-10-CM | POA: Diagnosis not present

## 2017-03-07 DIAGNOSIS — N898 Other specified noninflammatory disorders of vagina: Secondary | ICD-10-CM | POA: Diagnosis not present

## 2017-03-07 DIAGNOSIS — Z0000 Encounter for general adult medical examination without abnormal findings: Secondary | ICD-10-CM | POA: Diagnosis not present

## 2017-03-07 DIAGNOSIS — N760 Acute vaginitis: Secondary | ICD-10-CM | POA: Diagnosis not present

## 2017-03-07 DIAGNOSIS — M5442 Lumbago with sciatica, left side: Secondary | ICD-10-CM | POA: Diagnosis not present

## 2017-03-13 ENCOUNTER — Other Ambulatory Visit: Payer: Self-pay

## 2017-03-13 ENCOUNTER — Ambulatory Visit: Payer: PPO | Attending: Psychiatry

## 2017-03-13 DIAGNOSIS — M6281 Muscle weakness (generalized): Secondary | ICD-10-CM | POA: Diagnosis not present

## 2017-03-13 DIAGNOSIS — M545 Low back pain: Secondary | ICD-10-CM | POA: Diagnosis not present

## 2017-03-13 DIAGNOSIS — G8929 Other chronic pain: Secondary | ICD-10-CM | POA: Insufficient documentation

## 2017-03-13 DIAGNOSIS — R262 Difficulty in walking, not elsewhere classified: Secondary | ICD-10-CM | POA: Insufficient documentation

## 2017-03-13 NOTE — Therapy (Signed)
[place] MAIN [facility] SERVICES [address] [place], [place], [address] Phone: [phone]   Fax:  [phone]  Physical Therapy Treatment  Patient Details  Name: Lindsey Willis MRN: [id number] Date of Birth: 05-04-1975 Referring Provider: Fabio Bering   Encounter Date: 03/13/2017  PT End of Session - 03/13/17 1419    Visit Number  17    Number of Visits  33    PT Start Time  1100    PT Stop Time  1215    PT Time Calculation (min)  75 min       Past Medical History:  Diagnosis Date  . Anemia   . Asthma   . Borderline personality disorder ([place])   . Depression   . DVT of leg (deep venous thrombosis) ([place])   . Fibromyalgia   . May-Thurner syndrome   . PTSD (post-traumatic stress disorder)     Past Surgical History:  Procedure Laterality Date  . BREAST CYST ASPIRATION Right    neg  . CHOLECYSTECTOMY    . IVC FILTER PLACEMENT ([facility] HX)    . LEG SURGERY    . TUBAL LIGATION      There were no vitals filed for this visit.  Subjective Assessment - 03/13/17 1414    Subjective  Pt reports pain in R hip/back/buttock continues around a 6/10 and generally feeling better more consistently. Some complaints of R > L shoulder pain and wondering if there is a correlation between low and upper back/shoulder. Pt does also point to and describe up trap soreness/tightness    Pertinent History  Pt has had chronic low back pain for 10+ years.  Pt has hx of anxiety, asthma, chronic LBP, major depressive disorder, fibromyalgia, May-Thurner Syndrome, RLS and PTSD.  Pt has had a hx of LE DVT's and pulmonary embolism with multiple stene placements and thrombolysis in B LE's with last stent placement occuring in 2012.  Pt is currently on anticoagulants. Pt has had 2 nerve blocks for leg pain in the past resulting in decreased sensation and function of L LE.  Pt also has decreased sensation of B feet.      Enters/exits pool via steps Participates in  the following  Education on progression as well as aquatic HEP and concepts for continued progress and optimal result through varied exercises and/or programs performed 3-4 weeks at a time.   Ambulation with noodles at side for less UE support - 4 L fwd - 4 L side - 2 L slow fwd, no UE support (decreased stride)  Bench core with LE strength - Straight leg hip flex, return to abd, R/L 2 x 10 ea - SKTC tuck, B  2 x 10 ea - STS no UE support, hands together in front of chest, 25x  Wall core with UE strength, in partial wall sit position (shoulder out of water), green dumbbells - Triceps press downs, 2 x 10  - Sh abd/add, 2 x 10  - Sh flex/ext, 1 dumbell, 2 x 10   Stretching, B, 3 x 15 sec ea - SL long sit hamstrings/gastroc - SKTC - Piriformis, cross body - Piriformis/groin, fig 4 - Up trap - Posterior capsule/mid trap                           PT Education - 03/13/17 1417    Education provided  Yes    Education Details  Education regarding muscle/fascia relationship and  distant pain affects. Educated regarding eventual aquatic HEP and progression as well as changing routine for optimal results. Educated in up trap and posterior capsule/mid trap stretching.     Person(s) Educated  Patient    Methods  Explanation;Demonstration    Comprehension  Verbalized understanding;Returned demonstration       PT Short Term Goals - 02/20/17 1410      PT SHORT TERM GOAL #1   Title  Pt will demonstrate improved pain levels with activty to 6/10 or <.    Baseline  Worst: 9/10, Best: 6/10, 8/10 right hip pain    Time  4    Period  Weeks    Status  Partially Met      PT SHORT TERM GOAL #2   Title  Pt will improve Mod ODI to 60% or less indicating severe disability in order to demonstrate self-reported improvements of pain and mobility.    Baseline  11/15/16: 72% indicating extreme disability, 52 %    Time  8    Period  Weeks    Status  Partially Met        PT  Long Term Goals - 03/01/17 1357      PT LONG TERM GOAL #1   Title  Pt will demonstrate independence with HEP in order to manage pain and other symptoms.    Baseline  Patient is in aquatic therapy and is doing some of the exercises out side of the pool    Time  8    Period  Weeks    Status  Partially Met    Target Date  04/26/17      PT LONG TERM GOAL #2   Title  Pt will improve gait speed to at least 1.0 m/s in order to demonstrate improvements in LE strength and decreaed pain levels in order to ambulate within limited community ambulator status.    Baseline  11/15/16: 0.5 m/s, .36 m/sec; .45 sec 01/23/17, .41 m/sec    Time  8    Period  Weeks    Status  Partially Met    Target Date  04/26/17      PT LONG TERM GOAL #3   Title  Pt will reduce time to complete 5x sit to stand to <30 seconds in order to demonstrate improved LE strength and decreaed levels of pain, demonstrating improvement in functional mobility.    Baseline  11/15/16: 50.38 sec, 50.54 sec:01/23/17 45.33 sec, 41.66 sec    Time  8    Period  Weeks    Status  Partially Met    Target Date  04/26/17      PT LONG TERM GOAL #4   Title  Pt will improve Mod ODI to 40% or < indicating moderate level of disability in order to demonstrate self-reported improvements in pain and mobility.    Baseline  11/15/16: 72% indicating extreme disability, 52 % ODI; 44% ODI, 48% indicating moderate disability    Time  8    Period  Weeks    Status  Partially Met    Target Date  04/26/17      PT LONG TERM GOAL #5   Title  Pt will improve LE strength to at least 4/5 in all limited planes in order to improve mobility, gait, and ADL ability.    Baseline  Gross strength assessment: 3/5 - 2/5 in L hip blex and B hip ext    Time  8    Period  Weeks  Status  Partially Met    Target Date  04/26/17            Plan - 03/13/17 1420    Clinical Impression Statement  Pt tolerating increased exercise and progression of exercises well.  Discussion with pt of noted improved posture, more fluid gait in and out of water as well as improved technique and range with strengthening and stretching exercises.     Rehab Potential  Fair    Clinical Impairments Affecting Rehab Potential  age, perception of dx, comorbidities    PT Frequency  2x / week    PT Duration  8 weeks    PT Treatment/Interventions  ADLs/Self Care Home Management;Aquatic Therapy;Cryotherapy;Moist Heat;Traction;Ultrasound;Electrical Stimulation;Gait training;Stair training;Functional mobility training;Therapeutic activities;Therapeutic exercise;Balance training;Neuromuscular re-education;Patient/family education;Manual techniques;Passive range of motion;Dry needling;Taping    PT Next Visit Plan  begin aquatic therapy    PT Home Exercise Plan  abdominal bracing in hooklying     Consulted and Agree with Plan of Care  Patient       Patient will benefit from skilled therapeutic intervention in order to improve the following deficits and impairments:  Abnormal gait, Decreased activity tolerance, Decreased endurance, Decreased mobility, Decreased range of motion, Decreased strength, Difficulty walking, Hypomobility, Increased muscle spasms, Pain  Visit Diagnosis: Chronic midline low back pain, with sciatica presence unspecified  Muscle weakness (generalized)  Difficulty in walking, not elsewhere classified  Chronic right-sided low back pain, with sciatica presence unspecified     Problem List Patient Active Problem List   Diagnosis Date Noted  . Deep vein thrombosis (DVT) ([place]) 04/08/2015  . May-Thurner syndrome 04/08/2015  . Overdose 02/09/2015  . Ache in joint 11/21/2013  . Arteriovenous fistula ([place]) 06/20/2013  . Absolute anemia 10/19/2012  . Recurrent major depressive disorder, in partial remission ([place]) 07/13/2012  . Chronic pain 01/22/2012  . Clinical depression 01/22/2012  . Disease of lung 01/22/2012  . Fibromyalgia 01/22/2012  . LBP (low back  pain) 01/22/2012  . Cannot sleep 01/22/2012  . Emotional neurotic disorder 01/22/2012  . Cannabis abuse, continuous 01/22/2012  . Pulmonary embolism with infarction ([place]) 01/22/2012  . Esophagitis, reflux 01/22/2012  . Current tobacco use 01/22/2012  . Narcotic drug use 11/02/2010  . Post-phlebitic syndrome 07/05/2010  . Borderline personality disorder ([place]) 06/15/2010  . Chronic deep vein thrombosis (DVT) of lower extremity ([place]) 06/15/2010  . Hereditary and idiopathic neuropathy 06/15/2010  . Neurosis, posttraumatic 06/15/2010    Larae Grooms 03/13/2017, 2:22 PM  [place] MAIN [facility] SERVICES [address] [place], [place], [address] Phone: [phone]   Fax:  [phone]  Name: ZAINAH STEVEN MRN: [id number] Date of Birth: 1975-09-10

## 2017-03-20 ENCOUNTER — Ambulatory Visit: Payer: PPO

## 2017-03-20 ENCOUNTER — Other Ambulatory Visit: Payer: Self-pay

## 2017-03-20 DIAGNOSIS — G8929 Other chronic pain: Secondary | ICD-10-CM

## 2017-03-20 DIAGNOSIS — M545 Low back pain: Secondary | ICD-10-CM | POA: Diagnosis not present

## 2017-03-20 DIAGNOSIS — R262 Difficulty in walking, not elsewhere classified: Secondary | ICD-10-CM

## 2017-03-20 DIAGNOSIS — M6281 Muscle weakness (generalized): Secondary | ICD-10-CM

## 2017-03-20 NOTE — Therapy (Signed)
[place] MAIN [facility] SERVICES [address] [place], [place], [address] Phone: [phone]   Fax:  [phone]  Physical Therapy Treatment  Patient Details  Name: Lindsey Willis MRN: [id number] Date of Birth: 07-04-1975 Referring Provider: Fabio Bering   Encounter Date: 03/20/2017  PT End of Session - 03/20/17 1450    Visit Number  18    Number of Visits  33    PT Start Time  1030    PT Stop Time  1130    PT Time Calculation (min)  60 min    Activity Tolerance  Patient tolerated treatment well    Behavior During Therapy  [facility] for tasks assessed/performed       Past Medical History:  Diagnosis Date  . Anemia   . Asthma   . Borderline personality disorder ([place])   . Depression   . DVT of leg (deep venous thrombosis) ([place])   . Fibromyalgia   . May-Thurner syndrome   . PTSD (post-traumatic stress disorder)     Past Surgical History:  Procedure Laterality Date  . BREAST CYST ASPIRATION Right    neg  . CHOLECYSTECTOMY    . IVC FILTER PLACEMENT ([facility] HX)    . LEG SURGERY    . TUBAL LIGATION      There were no vitals filed for this visit.  Subjective Assessment - 03/20/17 1447    Subjective  Pt reports pain in R back/hip continues stable around 6/10; tolerated last session well. Pt notes starting with an upper respiratory infection yesterday with general malaise, no fever.     Pertinent History  Pt has had chronic low back pain for 10+ years.  Pt has hx of anxiety, asthma, chronic LBP, major depressive disorder, fibromyalgia, May-Thurner Syndrome, RLS and PTSD.  Pt has had a hx of LE DVT's and pulmonary embolism with multiple stene placements and thrombolysis in B LE's with last stent placement occuring in 2012.  Pt is currently on anticoagulants. Pt has had 2 nerve blocks for leg pain in the past resulting in decreased sensation and function of L LE.  Pt also has decreased sensation of B feet.      Patient enters/exits via  steps Participates in the following.   Ambulation with kick boards - 4 L fwd - 4 L side  Core stabilization at wall, straight stand position, deep water - LE work, 3 x 10 each  - SKTC, B  - outward hip circles, R/L   UE work, 3 x 10 each  - B dumbbels   - Triceps press downs   - Sh abd/add  - Single dumbbell   - sh flex/ext   - no dumbbells   - sh horiz abd/add  Bench - Bike, 5 min  Stretching, 15 min - single side long sit ham/gastroc - SKTC - cross body piriformis - Seated figure 4 piriformis                         PT Education - 03/20/17 1453    Education Details  Bench scoots with upper back stabilization/core       PT Short Term Goals - 02/20/17 1410      PT SHORT TERM GOAL #1   Title  Pt will demonstrate improved pain levels with activty to 6/10 or <.    Baseline  Worst: 9/10, Best: 6/10, 8/10 right hip pain    Time  4  Period  Weeks    Status  Partially Met      PT SHORT TERM GOAL #2   Title  Pt will improve Mod ODI to 60% or less indicating severe disability in order to demonstrate self-reported improvements of pain and mobility.    Baseline  11/15/16: 72% indicating extreme disability, 52 %    Time  8    Period  Weeks    Status  Partially Met        PT Long Term Goals - 03/01/17 1357      PT LONG TERM GOAL #1   Title  Pt will demonstrate independence with HEP in order to manage pain and other symptoms.    Baseline  Patient is in aquatic therapy and is doing some of the exercises out side of the pool    Time  8    Period  Weeks    Status  Partially Met    Target Date  04/26/17      PT LONG TERM GOAL #2   Title  Pt will improve gait speed to at least 1.0 m/s in order to demonstrate improvements in LE strength and decreaed pain levels in order to ambulate within limited community ambulator status.    Baseline  11/15/16: 0.5 m/s, .36 m/sec; .45 sec 01/23/17, .41 m/sec    Time  8    Period  Weeks    Status  Partially  Met    Target Date  04/26/17      PT LONG TERM GOAL #3   Title  Pt will reduce time to complete 5x sit to stand to <30 seconds in order to demonstrate improved LE strength and decreaed levels of pain, demonstrating improvement in functional mobility.    Baseline  11/15/16: 50.38 sec, 50.54 sec:01/23/17 45.33 sec, 41.66 sec    Time  8    Period  Weeks    Status  Partially Met    Target Date  04/26/17      PT LONG TERM GOAL #4   Title  Pt will improve Mod ODI to 40% or < indicating moderate level of disability in order to demonstrate self-reported improvements in pain and mobility.    Baseline  11/15/16: 72% indicating extreme disability, 52 % ODI; 44% ODI, 48% indicating moderate disability    Time  8    Period  Weeks    Status  Partially Met    Target Date  04/26/17      PT LONG TERM GOAL #5   Title  Pt will improve LE strength to at least 4/5 in all limited planes in order to improve mobility, gait, and ADL ability.    Baseline  Gross strength assessment: 3/5 - 2/5 in L hip blex and B hip ext    Time  8    Period  Weeks    Status  Partially Met    Target Date  04/26/17            Plan - 03/20/17 1450    Clinical Impression Statement  Kept session to more stabilized positions without increases to more difficult positions for core stab exercises due to present illness/general malaise. Will continue to progress strengthening as tolerated.     Rehab Potential  Fair    Clinical Impairments Affecting Rehab Potential  age, perception of dx, comorbidities    PT Frequency  2x / week    PT Duration  8 weeks    PT Treatment/Interventions  ADLs/Self Care Home  Management;Aquatic Therapy;Cryotherapy;Moist Heat;Traction;Ultrasound;Electrical Stimulation;Gait training;Stair training;Functional mobility training;Therapeutic activities;Therapeutic exercise;Balance training;Neuromuscular re-education;Patient/family education;Manual techniques;Passive range of motion;Dry needling;Taping    PT  Next Visit Plan  begin aquatic therapy    PT Home Exercise Plan  abdominal bracing in hooklying     Consulted and Agree with Plan of Care  Patient       Patient will benefit from skilled therapeutic intervention in order to improve the following deficits and impairments:  Abnormal gait, Decreased activity tolerance, Decreased endurance, Decreased mobility, Decreased range of motion, Decreased strength, Difficulty walking, Hypomobility, Increased muscle spasms, Pain  Visit Diagnosis: Chronic midline low back pain, with sciatica presence unspecified  Muscle weakness (generalized)  Difficulty in walking, not elsewhere classified  Chronic right-sided low back pain, with sciatica presence unspecified     Problem List Patient Active Problem List   Diagnosis Date Noted  . Deep vein thrombosis (DVT) ([place]) 04/08/2015  . May-Thurner syndrome 04/08/2015  . Overdose 02/09/2015  . Ache in joint 11/21/2013  . Arteriovenous fistula ([place]) 06/20/2013  . Absolute anemia 10/19/2012  . Recurrent major depressive disorder, in partial remission ([place]) 07/13/2012  . Chronic pain 01/22/2012  . Clinical depression 01/22/2012  . Disease of lung 01/22/2012  . Fibromyalgia 01/22/2012  . LBP (low back pain) 01/22/2012  . Cannot sleep 01/22/2012  . Emotional neurotic disorder 01/22/2012  . Cannabis abuse, continuous 01/22/2012  . Pulmonary embolism with infarction ([place]) 01/22/2012  . Esophagitis, reflux 01/22/2012  . Current tobacco use 01/22/2012  . Narcotic drug use 11/02/2010  . Post-phlebitic syndrome 07/05/2010  . Borderline personality disorder ([place]) 06/15/2010  . Chronic deep vein thrombosis (DVT) of lower extremity ([place]) 06/15/2010  . Hereditary and idiopathic neuropathy 06/15/2010  . Neurosis, posttraumatic 06/15/2010    Larae Grooms 03/20/2017, 2:54 PM  [place] MAIN [facility] SERVICES [address] [place], [place], [address] Phone: [phone]    Fax:  [phone]  Name: Lindsey Willis MRN: [id number] Date of Birth: Jun 20, 1975

## 2017-03-28 ENCOUNTER — Ambulatory Visit: Payer: PPO

## 2017-03-28 DIAGNOSIS — R262 Difficulty in walking, not elsewhere classified: Secondary | ICD-10-CM

## 2017-03-28 DIAGNOSIS — M545 Low back pain: Secondary | ICD-10-CM | POA: Diagnosis not present

## 2017-03-28 DIAGNOSIS — M6281 Muscle weakness (generalized): Secondary | ICD-10-CM

## 2017-03-28 DIAGNOSIS — G8929 Other chronic pain: Secondary | ICD-10-CM
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
## 2017-04-05 ENCOUNTER — Other Ambulatory Visit: Payer: Self-pay

## 2017-04-05 ENCOUNTER — Ambulatory Visit: Payer: PPO

## 2017-04-05 DIAGNOSIS — M6281 Muscle weakness (generalized): Secondary | ICD-10-CM

## 2017-04-05 DIAGNOSIS — M545 Low back pain: Principal | ICD-10-CM

## 2017-04-05 DIAGNOSIS — G8929 Other chronic pain: Secondary | ICD-10-CM

## 2017-04-05 DIAGNOSIS — R262 Difficulty in walking, not elsewhere classified: Secondary | ICD-10-CM

## 2017-04-05 NOTE — Therapy (Signed)
[place] MAIN [facility] SERVICES [address] [place], [place], [address] Phone: [phone]   Fax:  [phone]  Physical Therapy Treatment  Patient Details  Name: Lindsey Willis MRN: [id number] Date of Birth: April 13, 1975 Referring Provider: Fabio Bering   Encounter Date: 04/05/2017  PT End of Session - 04/05/17 1358    Visit Number  20    Number of Visits  36    Date for PT Re-Evaluation  05/23/17    PT Start Time  0940    PT Stop Time  1040    PT Time Calculation (min)  60 min    Activity Tolerance  Patient tolerated treatment well    Behavior During Therapy  [facility] for tasks assessed/performed       Past Medical History:  Diagnosis Date  . Anemia   . Asthma   . Borderline personality disorder ([place])   . Depression   . DVT of leg (deep venous thrombosis) ([place])   . Fibromyalgia   . May-Thurner syndrome   . PTSD (post-traumatic stress disorder)     Past Surgical History:  Procedure Laterality Date  . BREAST CYST ASPIRATION Right    neg  . CHOLECYSTECTOMY    . IVC FILTER PLACEMENT ([facility] HX)    . LEG SURGERY    . TUBAL LIGATION      There were no vitals filed for this visit.  Subjective Assessment - 04/05/17 1353    Subjective  Pt reports doing well today. Feels pain is more manageable throughout RLE/hip and back with use of positional release, stretching and overall improving some strength. Pt notes she has been using R shoe insert with noteable difference in walking comfort.     Pertinent History  Pt has had chronic low back pain for 10+ years.  Pt has hx of anxiety, asthma, chronic LBP, major depressive disorder, fibromyalgia, May-Thurner Syndrome, RLS and PTSD.  Pt has had a hx of LE DVT's and pulmonary embolism with multiple stene placements and thrombolysis in B LE's with last stent placement occuring in 2012.  Pt is currently on anticoagulants. Pt has had 2 nerve blocks for leg pain in the past resulting in decreased  sensation and function of L LE.  Pt also has decreased sensation of B feet.      Enters/exits via steps Participates in the following  Ambulation, no UE support 4 L fwd 4 L side  Core with UE strengthening, wall squat position 3 x 10 each  Green dumbells   Triceps press downs   Sh abd/add Red dumbbells  sh flex/ext  sh abd/add  Core with LE strengthening in stand at rail, 2 x 15 each  hip abd/add  hip flex/ext  squat  Stretching, 3 x 15 sec each  hamstrings/gastroc in long sit, single   SKTC  Piriformis, cross body  Figure 4 in seated                          PT Education - 04/05/17 1357    Education provided  Yes    Education Details  Standing core strengthening with LE strengthening and proper pelvic alignment. Educated on neutral versus anterior tilt.        PT Short Term Goals - 03/28/17 1046      PT SHORT TERM GOAL #1   Title  Pt will demonstrate improved pain levels with activty to 6/10 or <.    Baseline  Worst: 9/10, Best: 6/10, 8/10 right hip pain 2/20: worst pain 8/10    Time  4    Period  Weeks    Status  Partially Met      PT SHORT TERM GOAL #2   Title  Pt will improve Mod ODI to 60% or less indicating severe disability in order to demonstrate self-reported improvements of pain and mobility.    Baseline  11/15/16: 72% indicating extreme disability, 52 % 2/20: 44%    Time  8    Period  Weeks    Status  Achieved        PT Long Term Goals - 03/28/17 1047      PT LONG TERM GOAL #1   Title  Pt will demonstrate independence with HEP in order to manage pain and other symptoms.    Baseline  Patient is in aquatic therapy and is doing some of the exercises out side of the pool    Time  8    Period  Weeks    Status  Partially Met      PT LONG TERM GOAL #2   Title  Pt will improve gait speed to at least 1.0 m/s in order to demonstrate improvements in LE strength and decreaed pain levels in order to ambulate within limited  community ambulator status.    Baseline  11/15/16: 0.5 m/s, .36 m/sec; .45 sec 01/23/17, .41 m/sec 2/20:  .28ms     Time  8    Period  Weeks    Status  Partially Met      PT LONG TERM GOAL #3   Title  Pt will reduce time to complete 5x sit to stand to <30 seconds in order to demonstrate improved LE strength and decreaed levels of pain, demonstrating improvement in functional mobility.    Baseline  11/15/16: 50.38 sec, 50.54 sec:01/23/17 45.33 sec, 41.66 sec 2/20: 34.8 seconds    Time  8    Period  Weeks    Status  Partially Met      PT LONG TERM GOAL #4   Title  Pt will improve Mod ODI to 40% or < indicating moderate level of disability in order to demonstrate self-reported improvements in pain and mobility.    Baseline  11/15/16: 72% indicating extreme disability, 52 % ODI; 44% ODI, 48% indicating moderate disability; 2/20: 44%    Time  8    Period  Weeks    Status  Partially Met      PT LONG TERM GOAL #5   Title  Pt will improve LE strength to at least 4/5 in all limited planes in order to improve mobility, gait, and ADL ability.    Baseline  Gross strength assessment: 3/5 - 2/5 in L hip blex and B hip ext 2/20 : pain limited MMT     Time  8    Period  Weeks    Status  Partially Met            Plan - 04/05/17 1402    Clinical Impression Statement  Pt tolerated increase in LE with core strengthening; pt unable to tolerate these exercises at the beginning of aquatic therapy. Progressed core with UE strengthening as well today in a lower squat position with increased resistance to the UEs/core.     Rehab Potential  Fair    Clinical Impairments Affecting Rehab Potential  age, perception of dx, comorbidities    PT Frequency  2x / week    PT  Duration  8 weeks    PT Treatment/Interventions  ADLs/Self Care Home Management;Aquatic Therapy;Cryotherapy;Moist Heat;Traction;Ultrasound;Electrical Stimulation;Gait training;Stair training;Functional mobility training;Therapeutic  activities;Therapeutic exercise;Balance training;Neuromuscular re-education;Patient/family education;Manual techniques;Passive range of motion;Dry needling;Taping    PT Next Visit Plan  begin aquatic therapy    PT Home Exercise Plan  abdominal bracing in hooklying     Consulted and Agree with Plan of Care  Patient       Patient will benefit from skilled therapeutic intervention in order to improve the following deficits and impairments:  Abnormal gait, Decreased activity tolerance, Decreased endurance, Decreased mobility, Decreased range of motion, Decreased strength, Difficulty walking, Hypomobility, Increased muscle spasms, Pain, Impaired flexibility, Improper body mechanics, Postural dysfunction  Visit Diagnosis: Chronic midline low back pain, with sciatica presence unspecified  Muscle weakness (generalized)  Difficulty in walking, not elsewhere classified  Chronic right-sided low back pain, with sciatica presence unspecified     Problem List Patient Active Problem List   Diagnosis Date Noted  . Deep vein thrombosis (DVT) ([place]) 04/08/2015  . May-Thurner syndrome 04/08/2015  . Overdose 02/09/2015  . Ache in joint 11/21/2013  . Arteriovenous fistula ([place]) 06/20/2013  . Absolute anemia 10/19/2012  . Recurrent major depressive disorder, in partial remission ([place]) 07/13/2012  . Chronic pain 01/22/2012  . Clinical depression 01/22/2012  . Disease of lung 01/22/2012  . Fibromyalgia 01/22/2012  . LBP (low back pain) 01/22/2012  . Cannot sleep 01/22/2012  . Emotional neurotic disorder 01/22/2012  . Cannabis abuse, continuous 01/22/2012  . Pulmonary embolism with infarction ([place]) 01/22/2012  . Esophagitis, reflux 01/22/2012  . Current tobacco use 01/22/2012  . Narcotic drug use 11/02/2010  . Post-phlebitic syndrome 07/05/2010  . Borderline personality disorder ([place]) 06/15/2010  . Chronic deep vein thrombosis (DVT) of lower extremity ([place]) 06/15/2010  . Hereditary and idiopathic  neuropathy 06/15/2010  . Neurosis, posttraumatic 06/15/2010    Larae Grooms 04/05/2017, 2:05 PM  [place] MAIN [facility] SERVICES [address] [place], [place], [address] Phone: [phone]   Fax:  [phone]  Name: DELYLAH STANCZYK MRN: [id number] Date of Birth: 23-Jan-1976

## 2017-04-12 ENCOUNTER — Ambulatory Visit: Payer: PPO | Attending: Psychiatry

## 2017-04-12 DIAGNOSIS — G8929 Other chronic pain: Secondary | ICD-10-CM | POA: Insufficient documentation

## 2017-04-12 DIAGNOSIS — M545 Low back pain: Secondary | ICD-10-CM | POA: Diagnosis not present

## 2017-04-12 DIAGNOSIS — M6281 Muscle weakness (generalized): Secondary | ICD-10-CM | POA: Diagnosis not present

## 2017-04-12 DIAGNOSIS — R262 Difficulty in walking, not elsewhere classified: Secondary | ICD-10-CM | POA: Insufficient documentation

## 2017-04-12 NOTE — Therapy (Signed)
[place] MAIN [facility] SERVICES [address] [place], [place], [address] Phone: [phone]   Fax:  [phone]  Physical Therapy Treatment  Patient Details  Name: Lindsey Willis MRN: [id number] Date of Birth: 1975-10-27 Referring Provider: Fabio Bering   Encounter Date: 04/12/2017  PT End of Session - 04/12/17 1341    Visit Number  21    Number of Visits  36    Date for PT Re-Evaluation  05/23/17    PT Start Time  1130    PT Stop Time  1230    PT Time Calculation (min)  60 min    Activity Tolerance  Patient tolerated treatment well    Behavior During Therapy  [facility] for tasks assessed/performed       Past Medical History:  Diagnosis Date  . Anemia   . Asthma   . Borderline personality disorder ([place])   . Depression   . DVT of leg (deep venous thrombosis) ([place])   . Fibromyalgia   . May-Thurner syndrome   . PTSD (post-traumatic stress disorder)     Past Surgical History:  Procedure Laterality Date  . BREAST CYST ASPIRATION Right    neg  . CHOLECYSTECTOMY    . IVC FILTER PLACEMENT ([facility] HX)    . LEG SURGERY    . TUBAL LIGATION      There were no vitals filed for this visit.  Subjective Assessment - 04/12/17 1335    Subjective  Feeling good today with overall less pain "It stopped raining"  Still thinks R shoe lift is helping.    Pertinent History  Pt has had chronic low back pain for 10+ years.  Pt has hx of anxiety, asthma, chronic LBP, major depressive disorder, fibromyalgia, May-Thurner Syndrome, RLS and PTSD.  Pt has had a hx of LE DVT's and pulmonary embolism with multiple stene placements and thrombolysis in B LE's with last stent placement occuring in 2012.  Pt is currently on anticoagulants. Pt has had 2 nerve blocks for leg pain in the past resulting in decreased sensation and function of L LE.  Pt also has decreased sensation of B feet.    Currently in Pain?  Yes    Pain Score  5     Pain Location  Hip    Pain  Orientation  Right    Pain Descriptors / Indicators  Aching    Pain Type  Acute pain    Pain Onset  More than a month ago    Pain Frequency  Constant        PT aquatic session Amb no UE support  4 L fwd 4 L back  Core strength with wall squat 3 x 10  Green  tricep push down shld ab/add Red shld flex/ext shld ab/add  Core LE at wall 2 x 15 Hi ab/add Hip flex/ext Squat  Stretching in hot tub Hamstring/gastroc in long sitting  SKTC Piriformis cross body Figure 4 seated                       PT Education - 04/12/17 1340    Education provided  Yes    Education Details  exercise techniques    Person(s) Educated  Patient    Methods  Explanation    Comprehension  Verbalized understanding;Returned demonstration       PT Short Term Goals - 03/28/17 1046      PT SHORT TERM GOAL #1   Title  Pt will demonstrate improved pain levels with activty to 6/10 or <.    Baseline  Worst: 9/10, Best: 6/10, 8/10 right hip pain 2/20: worst pain 8/10    Time  4    Period  Weeks    Status  Partially Met      PT SHORT TERM GOAL #2   Title  Pt will improve Mod ODI to 60% or less indicating severe disability in order to demonstrate self-reported improvements of pain and mobility.    Baseline  11/15/16: 72% indicating extreme disability, 52 % 2/20: 44%    Time  8    Period  Weeks    Status  Achieved        PT Long Term Goals - 03/28/17 1047      PT LONG TERM GOAL #1   Title  Pt will demonstrate independence with HEP in order to manage pain and other symptoms.    Baseline  Patient is in aquatic therapy and is doing some of the exercises out side of the pool    Time  8    Period  Weeks    Status  Partially Met      PT LONG TERM GOAL #2   Title  Pt will improve gait speed to at least 1.0 m/s in order to demonstrate improvements in LE strength and decreaed pain levels in order to ambulate within limited community ambulator status.    Baseline  11/15/16: 0.5  m/s, .36 m/sec; .45 sec 01/23/17, .41 m/sec 2/20:  .21ms     Time  8    Period  Weeks    Status  Partially Met      PT LONG TERM GOAL #3   Title  Pt will reduce time to complete 5x sit to stand to <30 seconds in order to demonstrate improved LE strength and decreaed levels of pain, demonstrating improvement in functional mobility.    Baseline  11/15/16: 50.38 sec, 50.54 sec:01/23/17 45.33 sec, 41.66 sec 2/20: 34.8 seconds    Time  8    Period  Weeks    Status  Partially Met      PT LONG TERM GOAL #4   Title  Pt will improve Mod ODI to 40% or < indicating moderate level of disability in order to demonstrate self-reported improvements in pain and mobility.    Baseline  11/15/16: 72% indicating extreme disability, 52 % ODI; 44% ODI, 48% indicating moderate disability; 2/20: 44%    Time  8    Period  Weeks    Status  Partially Met      PT LONG TERM GOAL #5   Title  Pt will improve LE strength to at least 4/5 in all limited planes in order to improve mobility, gait, and ADL ability.    Baseline  Gross strength assessment: 3/5 - 2/5 in L hip blex and B hip ext 2/20 : pain limited MMT     Time  8    Period  Weeks    Status  Partially Met            Plan - 04/12/17 1342    Clinical Impression Statement  continue to increase LE and core strength to tolerance.      History and Personal Factors relevant to plan of care:  changing pain levels, MDD, anxiety, chronic pain, May-Thurner syndrome, hx DVT and pulmonary embolism    Clinical Presentation  Unstable    Clinical Decision Making  High    Rehab  Potential  Fair    Clinical Impairments Affecting Rehab Potential  age, perception of dx, comorbidities    PT Frequency  2x / week    PT Duration  8 weeks    Consulted and Agree with Plan of Care  Patient       Patient will benefit from skilled therapeutic intervention in order to improve the following deficits and impairments:  Abnormal gait, Decreased activity tolerance, Decreased  endurance, Decreased mobility, Decreased range of motion, Decreased strength, Difficulty walking, Hypomobility, Increased muscle spasms, Pain, Impaired flexibility, Improper body mechanics, Postural dysfunction  Visit Diagnosis: No diagnosis found.     Problem List Patient Active Problem List   Diagnosis Date Noted  . Deep vein thrombosis (DVT) ([place]) 04/08/2015  . May-Thurner syndrome 04/08/2015  . Overdose 02/09/2015  . Ache in joint 11/21/2013  . Arteriovenous fistula ([place]) 06/20/2013  . Absolute anemia 10/19/2012  . Recurrent major depressive disorder, in partial remission ([place]) 07/13/2012  . Chronic pain 01/22/2012  . Clinical depression 01/22/2012  . Disease of lung 01/22/2012  . Fibromyalgia 01/22/2012  . LBP (low back pain) 01/22/2012  . Cannot sleep 01/22/2012  . Emotional neurotic disorder 01/22/2012  . Cannabis abuse, continuous 01/22/2012  . Pulmonary embolism with infarction ([place]) 01/22/2012  . Esophagitis, reflux 01/22/2012  . Current tobacco use 01/22/2012  . Narcotic drug use 11/02/2010  . Post-phlebitic syndrome 07/05/2010  . Borderline personality disorder ([place]) 06/15/2010  . Chronic deep vein thrombosis (DVT) of lower extremity ([place]) 06/15/2010  . Hereditary and idiopathic neuropathy 06/15/2010  . Neurosis, posttraumatic 06/15/2010   Chesley Noon, PTA 04/12/17, 1:46 PM  [place] MAIN [facility] SERVICES [address] [place], [place], [address] Phone: [phone]   Fax:  [phone]  Name: Lindsey Willis MRN: [id number] Date of Birth: Jan 23, 1976

## 2017-04-17 ENCOUNTER — Ambulatory Visit: Payer: PPO

## 2017-04-17 ENCOUNTER — Other Ambulatory Visit: Payer: Self-pay

## 2017-04-17 DIAGNOSIS — M545 Low back pain: Secondary | ICD-10-CM | POA: Diagnosis not present

## 2017-04-17 DIAGNOSIS — G8929 Other chronic pain: Secondary | ICD-10-CM

## 2017-04-17 DIAGNOSIS — R262 Difficulty in walking, not elsewhere classified: Secondary | ICD-10-CM

## 2017-04-17 DIAGNOSIS — M6281 Muscle weakness (generalized): Secondary | ICD-10-CM

## 2017-04-17 NOTE — Therapy (Signed)
[place] MAIN [facility] SERVICES [address] [place], [place], [address] Phone: [phone]   Fax:  [phone]  Physical Therapy Treatment  Patient Details  Name: Lindsey Willis MRN: [id number] Date of Birth: 1975/06/14 Referring Provider: Fabio Bering   Encounter Date: 04/17/2017  PT End of Session - 04/17/17 1503    Visit Number  22    Number of Visits  36    Date for PT Re-Evaluation  05/23/17    PT Start Time  1115    PT Stop Time  1215    PT Time Calculation (min)  60 min    Activity Tolerance  Patient tolerated treatment well    Behavior During Therapy  [facility] for tasks assessed/performed       Past Medical History:  Diagnosis Date  . Anemia   . Asthma   . Borderline personality disorder ([place])   . Depression   . DVT of leg (deep venous thrombosis) ([place])   . Fibromyalgia   . May-Thurner syndrome   . PTSD (post-traumatic stress disorder)     Past Surgical History:  Procedure Laterality Date  . BREAST CYST ASPIRATION Right    neg  . CHOLECYSTECTOMY    . IVC FILTER PLACEMENT ([facility] HX)    . LEG SURGERY    . TUBAL LIGATION      There were no vitals filed for this visit.  Subjective Assessment - 04/17/17 1501    Subjective  Pt reports mild increased pain in R hip back and LLE over the past couple of days; pt notes increased activity helping family and feels it may be catching up with her at this time. Pt states LLE was quite swollen yesterday, but improved currently. Pt rates pain 6.5/10 throughout.     Pertinent History  Pt has had chronic low back pain for 10+ years.  Pt has hx of anxiety, asthma, chronic LBP, major depressive disorder, fibromyalgia, May-Thurner Syndrome, RLS and PTSD.  Pt has had a hx of LE DVT's and pulmonary embolism with multiple stene placements and thrombolysis in B LE's with last stent placement occuring in 2012.  Pt is currently on anticoagulants. Pt has had 2 nerve blocks for leg pain in the  past resulting in decreased sensation and function of L LE.  Pt also has decreased sensation of B feet.      Pt enters/exits pool via steps Participates in the following  Ambulation  4 L fwd  4 L side  Repetitions not counted; continued each several minutes to encourage slow movement and gentle increase in range, but maintaining core stability  Gentle squats  Gentle lunges  Single leg hip flex/ext with knee partially flexed, B  Seated bicycle  Noodle hang  Stretching   hamstring/gastroc, long sit  SKTC   piriformis, cross body  piriformis, seated fig 4   LB/lateral trunk, stand lean from rail center R/L                            PT Short Term Goals - 03/28/17 1046      PT SHORT TERM GOAL #1   Title  Pt will demonstrate improved pain levels with activty to 6/10 or <.    Baseline  Worst: 9/10, Best: 6/10, 8/10 right hip pain 2/20: worst pain 8/10    Time  4    Period  Weeks    Status  Partially Met  PT SHORT TERM GOAL #2   Title  Pt will improve Mod ODI to 60% or less indicating severe disability in order to demonstrate self-reported improvements of pain and mobility.    Baseline  11/15/16: 72% indicating extreme disability, 52 % 2/20: 44%    Time  8    Period  Weeks    Status  Achieved        PT Long Term Goals - 03/28/17 1047      PT LONG TERM GOAL #1   Title  Pt will demonstrate independence with HEP in order to manage pain and other symptoms.    Baseline  Patient is in aquatic therapy and is doing some of the exercises out side of the pool    Time  8    Period  Weeks    Status  Partially Met      PT LONG TERM GOAL #2   Title  Pt will improve gait speed to at least 1.0 m/s in order to demonstrate improvements in LE strength and decreaed pain levels in order to ambulate within limited community ambulator status.    Baseline  11/15/16: 0.5 m/s, .36 m/sec; .45 sec 01/23/17, .41 m/sec 2/20:  .85ms     Time  8    Period  Weeks     Status  Partially Met      PT LONG TERM GOAL #3   Title  Pt will reduce time to complete 5x sit to stand to <30 seconds in order to demonstrate improved LE strength and decreaed levels of pain, demonstrating improvement in functional mobility.    Baseline  11/15/16: 50.38 sec, 50.54 sec:01/23/17 45.33 sec, 41.66 sec 2/20: 34.8 seconds    Time  8    Period  Weeks    Status  Partially Met      PT LONG TERM GOAL #4   Title  Pt will improve Mod ODI to 40% or < indicating moderate level of disability in order to demonstrate self-reported improvements in pain and mobility.    Baseline  11/15/16: 72% indicating extreme disability, 52 % ODI; 44% ODI, 48% indicating moderate disability; 2/20: 44%    Time  8    Period  Weeks    Status  Partially Met      PT LONG TERM GOAL #5   Title  Pt will improve LE strength to at least 4/5 in all limited planes in order to improve mobility, gait, and ADL ability.    Baseline  Gross strength assessment: 3/5 - 2/5 in L hip blex and B hip ext 2/20 : pain limited MMT     Time  8    Period  Weeks    Status  Partially Met            Plan - 04/17/17 1503    Clinical Impression Statement  Pt presenting very guarded with all movement today in water, as if in greater pain (or stiffness). Presents more like pt did when initially beginning aquatic PT. Switched focus this session to slow, gradual movement to increase fluidity and decrease stiffness, pain/guarding patterns.     Rehab Potential  Fair    Clinical Impairments Affecting Rehab Potential  age, perception of dx, comorbidities    PT Frequency  2x / week    PT Duration  8 weeks    Consulted and Agree with Plan of Care  Patient       Patient will benefit from skilled therapeutic intervention in order to  improve the following deficits and impairments:  Abnormal gait, Decreased activity tolerance, Decreased endurance, Decreased mobility, Decreased range of motion, Decreased strength, Difficulty walking,  Hypomobility, Increased muscle spasms, Pain, Impaired flexibility, Improper body mechanics, Postural dysfunction  Visit Diagnosis: Chronic midline low back pain, with sciatica presence unspecified  Muscle weakness (generalized)  Difficulty in walking, not elsewhere classified  Chronic right-sided low back pain, with sciatica presence unspecified     Problem List Patient Active Problem List   Diagnosis Date Noted  . Deep vein thrombosis (DVT) ([place]) 04/08/2015  . May-Thurner syndrome 04/08/2015  . Overdose 02/09/2015  . Ache in joint 11/21/2013  . Arteriovenous fistula ([place]) 06/20/2013  . Absolute anemia 10/19/2012  . Recurrent major depressive disorder, in partial remission ([place]) 07/13/2012  . Chronic pain 01/22/2012  . Clinical depression 01/22/2012  . Disease of lung 01/22/2012  . Fibromyalgia 01/22/2012  . LBP (low back pain) 01/22/2012  . Cannot sleep 01/22/2012  . Emotional neurotic disorder 01/22/2012  . Cannabis abuse, continuous 01/22/2012  . Pulmonary embolism with infarction ([place]) 01/22/2012  . Esophagitis, reflux 01/22/2012  . Current tobacco use 01/22/2012  . Narcotic drug use 11/02/2010  . Post-phlebitic syndrome 07/05/2010  . Borderline personality disorder ([place]) 06/15/2010  . Chronic deep vein thrombosis (DVT) of lower extremity ([place]) 06/15/2010  . Hereditary and idiopathic neuropathy 06/15/2010  . Neurosis, posttraumatic 06/15/2010    Larae Grooms 04/17/2017, 3:07 PM  [place] MAIN [facility] SERVICES [address] [place], [place], [address] Phone: [phone]   Fax:  [phone]  Name: Lindsey Willis MRN: [id number] Date of Birth: [birth year]

## 2017-04-26 ENCOUNTER — Other Ambulatory Visit: Payer: Self-pay

## 2017-04-26 ENCOUNTER — Ambulatory Visit: Payer: PPO

## 2017-04-26 DIAGNOSIS — M6281 Muscle weakness (generalized): Secondary | ICD-10-CM

## 2017-04-26 DIAGNOSIS — M545 Low back pain: Secondary | ICD-10-CM | POA: Diagnosis not present

## 2017-04-26 DIAGNOSIS — G8929 Other chronic pain: Secondary | ICD-10-CM

## 2017-04-26 DIAGNOSIS — R262 Difficulty in walking, not elsewhere classified: Secondary | ICD-10-CM

## 2017-04-26 NOTE — Therapy (Signed)
[place] MAIN [facility] SERVICES [address] [place], [place], [address] Phone: [phone]   Fax:  [phone]  Physical Therapy Treatment  Patient Details  Name: Lindsey Willis MRN: [id number] Date of Birth: Apr 16, 1975 Referring Provider: Fabio Bering   Encounter Date: 04/26/2017  PT End of Session - 04/26/17 1404    Visit Number  23    Number of Visits  36    Date for PT Re-Evaluation  05/23/17    PT Start Time  1100    PT Stop Time  1200    PT Time Calculation (min)  60 min    Activity Tolerance  Patient tolerated treatment well    Behavior During Therapy  [facility] for tasks assessed/performed       Past Medical History:  Diagnosis Date  . Anemia   . Asthma   . Borderline personality disorder ([place])   . Depression   . DVT of leg (deep venous thrombosis) ([place])   . Fibromyalgia   . May-Thurner syndrome   . PTSD (post-traumatic stress disorder)     Past Surgical History:  Procedure Laterality Date  . BREAST CYST ASPIRATION Right    neg  . CHOLECYSTECTOMY    . IVC FILTER PLACEMENT ([facility] HX)    . LEG SURGERY    . TUBAL LIGATION      There were no vitals filed for this visit.  Subjective Assessment - 04/26/17 1402    Subjective  Reports mild increased ache R buttock/hip due to rain. 6/10. LLE has been mildly more uncomfortable off/on over past 2 weeks    Pertinent History  Pt has had chronic low back pain for 10+ years.  Pt has hx of anxiety, asthma, chronic LBP, major depressive disorder, fibromyalgia, May-Thurner Syndrome, RLS and PTSD.  Pt has had a hx of LE DVT's and pulmonary embolism with multiple stene placements and thrombolysis in B LE's with last stent placement occuring in 2012.  Pt is currently on anticoagulants. Pt has had 2 nerve blocks for leg pain in the past resulting in decreased sensation and function of L LE.  Pt also has decreased sensation of B feet.      Enters/exits pool via steps Participates in the  following  Ambulation  4 L fwd  2 L side  2 L side with squat  Core with LE strength, 30x ea  hip flex/abd  hip abd/add  squats  Sumo squats, 20x   Planks, 3 x 30 sec each  fwd  side, R/L  Stretching, 3 x 10 sec ea  ham/gastroc, long sit  piriformis, cross body and fig 4  SKTC                           PT Education - 04/26/17 1403    Education provided  Yes    Education Details  side step with squat, planks       PT Short Term Goals - 03/28/17 1046      PT SHORT TERM GOAL #1   Title  Pt will demonstrate improved pain levels with activty to 6/10 or <.    Baseline  Worst: 9/10, Best: 6/10, 8/10 right hip pain 2/20: worst pain 8/10    Time  4    Period  Weeks    Status  Partially Met      PT SHORT TERM GOAL #2   Title  Pt will improve Mod ODI to  60% or less indicating severe disability in order to demonstrate self-reported improvements of pain and mobility.    Baseline  11/15/16: 72% indicating extreme disability, 52 % 2/20: 44%    Time  8    Period  Weeks    Status  Achieved        PT Long Term Goals - 03/28/17 1047      PT LONG TERM GOAL #1   Title  Pt will demonstrate independence with HEP in order to manage pain and other symptoms.    Baseline  Patient is in aquatic therapy and is doing some of the exercises out side of the pool    Time  8    Period  Weeks    Status  Partially Met      PT LONG TERM GOAL #2   Title  Pt will improve gait speed to at least 1.0 m/s in order to demonstrate improvements in LE strength and decreaed pain levels in order to ambulate within limited community ambulator status.    Baseline  11/15/16: 0.5 m/s, .36 m/sec; .45 sec 01/23/17, .41 m/sec 2/20:  .53ms     Time  8    Period  Weeks    Status  Partially Met      PT LONG TERM GOAL #3   Title  Pt will reduce time to complete 5x sit to stand to <30 seconds in order to demonstrate improved LE strength and decreaed levels of pain, demonstrating  improvement in functional mobility.    Baseline  11/15/16: 50.38 sec, 50.54 sec:01/23/17 45.33 sec, 41.66 sec 2/20: 34.8 seconds    Time  8    Period  Weeks    Status  Partially Met      PT LONG TERM GOAL #4   Title  Pt will improve Mod ODI to 40% or < indicating moderate level of disability in order to demonstrate self-reported improvements in pain and mobility.    Baseline  11/15/16: 72% indicating extreme disability, 52 % ODI; 44% ODI, 48% indicating moderate disability; 2/20: 44%    Time  8    Period  Weeks    Status  Partially Met      PT LONG TERM GOAL #5   Title  Pt will improve LE strength to at least 4/5 in all limited planes in order to improve mobility, gait, and ADL ability.    Baseline  Gross strength assessment: 3/5 - 2/5 in L hip blex and B hip ext 2/20 : pain limited MMT     Time  8    Period  Weeks    Status  Partially Met            Plan - 04/26/17 1404    Clinical Impression Statement  Pt tolerated new exercises well with verbal cues required. Overall demonstrating much greater core/pelvic stabilization.      Rehab Potential  Fair    Clinical Impairments Affecting Rehab Potential  age, perception of dx, comorbidities    PT Frequency  2x / week    PT Duration  8 weeks    Consulted and Agree with Plan of Care  Patient       Patient will benefit from skilled therapeutic intervention in order to improve the following deficits and impairments:  Abnormal gait, Decreased activity tolerance, Decreased endurance, Decreased mobility, Decreased range of motion, Decreased strength, Difficulty walking, Hypomobility, Increased muscle spasms, Pain, Impaired flexibility, Improper body mechanics, Postural dysfunction  Visit Diagnosis: Chronic midline low  back pain, with sciatica presence unspecified  Muscle weakness (generalized)  Difficulty in walking, not elsewhere classified  Chronic right-sided low back pain, with sciatica presence unspecified     Problem  List Patient Active Problem List   Diagnosis Date Noted  . Deep vein thrombosis (DVT) ([place]) 04/08/2015  . May-Thurner syndrome 04/08/2015  . Overdose 02/09/2015  . Ache in joint 11/21/2013  . Arteriovenous fistula ([place]) 06/20/2013  . Absolute anemia 10/19/2012  . Recurrent major depressive disorder, in partial remission ([place]) 07/13/2012  . Chronic pain 01/22/2012  . Clinical depression 01/22/2012  . Disease of lung 01/22/2012  . Fibromyalgia 01/22/2012  . LBP (low back pain) 01/22/2012  . Cannot sleep 01/22/2012  . Emotional neurotic disorder 01/22/2012  . Cannabis abuse, continuous 01/22/2012  . Pulmonary embolism with infarction ([place]) 01/22/2012  . Esophagitis, reflux 01/22/2012  . Current tobacco use 01/22/2012  . Narcotic drug use 11/02/2010  . Post-phlebitic syndrome 07/05/2010  . Borderline personality disorder ([place]) 06/15/2010  . Chronic deep vein thrombosis (DVT) of lower extremity ([place]) 06/15/2010  . Hereditary and idiopathic neuropathy 06/15/2010  . Neurosis, posttraumatic 06/15/2010    Larae Grooms 04/26/2017, 2:06 PM  [place] MAIN [facility] SERVICES [address] [place], [place], [address] Phone: [phone]   Fax:  [phone]  Name: Lindsey Willis MRN: [id number] Date of Birth: 1975-05-01

## 2017-05-01 ENCOUNTER — Ambulatory Visit: Payer: PPO

## 2017-05-01 DIAGNOSIS — M6281 Muscle weakness (generalized): Secondary | ICD-10-CM

## 2017-05-01 DIAGNOSIS — M545 Low back pain: Secondary | ICD-10-CM | POA: Diagnosis not present

## 2017-05-01 DIAGNOSIS — R262 Difficulty in walking, not elsewhere classified: Secondary | ICD-10-CM

## 2017-05-01 DIAGNOSIS — G8929 Other chronic pain: Secondary | ICD-10-CM

## 2017-05-01 NOTE — Therapy (Signed)
[place] MAIN [facility] SERVICES [address] [place], [place], [address] Phone: [phone]   Fax:  [phone]  Physical Therapy Treatment  Patient Details  Name: Lindsey Willis MRN: [id number] Date of Birth: Oct 28, 1975 Referring Provider: Fabio Bering   Encounter Date: 05/01/2017  PT End of Session - 05/01/17 1118    Visit Number  24    Number of Visits  36    Date for PT Re-Evaluation  05/23/17    PT Start Time  1771    PT Stop Time  1115    PT Time Calculation (min)  38 min    Activity Tolerance  Patient tolerated treatment well    Behavior During Therapy  [facility] for tasks assessed/performed       Past Medical History:  Diagnosis Date  . Anemia   . Asthma   . Borderline personality disorder ([place])   . Depression   . DVT of leg (deep venous thrombosis) ([place])   . Fibromyalgia   . May-Thurner syndrome   . PTSD (post-traumatic stress disorder)     Past Surgical History:  Procedure Laterality Date  . BREAST CYST ASPIRATION Right    neg  . CHOLECYSTECTOMY    . IVC FILTER PLACEMENT ([facility] HX)    . LEG SURGERY    . TUBAL LIGATION      There were no vitals filed for this visit.  Subjective Assessment - 05/01/17 1043    Subjective  Patient reports allergies have been acting up. Thinks that the changing weather has been affecting pain. Reports pool is helping.     Pertinent History  Pt has had chronic low back pain for 10+ years.  Pt has hx of anxiety, asthma, chronic LBP, major depressive disorder, fibromyalgia, May-Thurner Syndrome, RLS and PTSD.  Pt has had a hx of LE DVT's and pulmonary embolism with multiple stene placements and thrombolysis in B LE's with last stent placement occuring in 2012.  Pt is currently on anticoagulants. Pt has had 2 nerve blocks for leg pain in the past resulting in decreased sensation and function of L LE.  Pt also has decreased sensation of B feet.    Limitations   Sitting;Walking;Writing;Lifting;House hold activities;Standing    How long can you sit comfortably?  5-10 minutes     How long can you stand comfortably?  <5 minutes     How long can you walk comfortably?  5-10 minutes    Diagnostic tests  none    Patient Stated Goals  "reduce overall pain"    Currently in Pain?  Yes    Pain Score  6     Pain Location  Back    Pain Orientation  Lower    Pain Descriptors / Indicators  Aching    Pain Type  Acute pain         MODI: 44%  VAS: worst 8/10 , best 6/10  10 MWT =.44ms  5XSTS 30 seconds  TUG: 22 seconds    Short arc distraction RLE and LLE 4x15 seconds Long arc distraction RLE and LLE  4x 15 seconds  adjustment of cane to proper height for ambulation.      No data recorded               PT Education - 05/01/17 1117    Education provided  Yes    Education Details  POC, cane height, progression towards goals     Person(s) Educated  Patient  Methods  Explanation;Demonstration;Verbal cues    Comprehension  Verbalized understanding;Returned demonstration       PT Short Term Goals - 05/01/17 1120      PT SHORT TERM GOAL #1   Title  Pt will demonstrate improved pain levels with activity to 6/10 or <.    Baseline  Worst: 9/10, Best: 6/10, 8/10 right hip pain 2/20: worst pain 8/10; 326: worst 7/10     Time  4    Period  Weeks    Status  Partially Met      PT SHORT TERM GOAL #2   Title  Pt will improve Mod ODI to 60% or less indicating severe disability in order to demonstrate self-reported improvements of pain and mobility.    Baseline  11/15/16: 72% indicating extreme disability, 52 % 2/20: 44%    Time  8    Period  Weeks    Status  Achieved        PT Long Term Goals - 05/01/17 1054      PT LONG TERM GOAL #1   Title  Pt will demonstrate independence with HEP in order to manage pain and other symptoms.    Baseline  Patient is in aquatic therapy and is doing her exercises outside of the pool    Time  8     Period  Weeks    Status  Partially Met      PT LONG TERM GOAL #2   Title  Pt will improve gait speed to at least 1.0 m/s in order to demonstrate improvements in LE strength and decreaed pain levels in order to ambulate within limited community ambulator status.    Baseline  11/15/16: 0.5 m/s, .36 m/sec; .45 sec 01/23/17, .41 m/sec 2/20:  .43ms 3/26: .55 m/s    Time  8    Period  Weeks    Status  Partially Met      PT LONG TERM GOAL #3   Title  Pt will reduce time to complete 5x sit to stand to <30 seconds in order to demonstrate improved LE strength and decreaed levels of pain, demonstrating improvement in functional mobility.    Baseline  11/15/16: 50.38 sec, 50.54 sec:01/23/17 45.33 sec, 41.66 sec 2/20: 34.8 seconds 3/26: 30 seconds    Time  8    Period  Weeks    Status  Achieved      PT LONG TERM GOAL #4   Title  Pt will improve Mod ODI to 40% or < indicating moderate level of disability in order to demonstrate self-reported improvements in pain and mobility.    Baseline  11/15/16: 72% indicating extreme disability, 52 % ODI; 44% ODI, 48% indicating moderate disability; 2/20: 44% 3/26: 44%    Time  8    Period  Weeks    Status  Partially Met      PT LONG TERM GOAL #5   Title  Pt will improve LE strength to at least 4/5 in all limited planes in order to improve mobility, gait, and ADL ability.    Baseline  Gross strength assessment: 3/5 - 2/5 in L hip blex and B hip ext 2/20 : pain limited MMT     Time  8    Period  Weeks    Status  Partially Met    Target Date  06/26/17      PT LONG TERM GOAL #6   Title  Pt will reduce time to complete 5x sit to stand to <20  seconds in order to demonstrate improved LE strength and decreaed levels of pain, demonstrating improvement in functional mobility.    Baseline  3/26: 30 seconds    Time  8    Period  Weeks    Status  New    Target Date  06/26/17      PT LONG TERM GOAL #7   Title  Patient will reduce timed up and go to <11 seconds to  reduce fall risk and demonstrate improved transfer/gait ability    Baseline  3/26: 22 seconds    Time  8    Period  Weeks    Status  New    Target Date  06/26/17            Plan - 05/01/17 1120    Clinical Impression Statement  Patient progressing towards all goals at this time with 5x STS= 30 seconds, 10 MWT=.55 m/s, TUG =22 seconds, MODI 44%. Patient reports insurance has a program [facility] therapy is done [facility]. Patient reports that the pool is helping her mover better, stretches and strengthens her. Patient will continue to benefit from skilled physical therapy/aquatic therapy to improve pain score and functional mobility for increased quality of life.     Rehab Potential  Fair    Clinical Impairments Affecting Rehab Potential  age, perception of dx, comorbidities    PT Frequency  2x / week    PT Duration  8 weeks    PT Treatment/Interventions  ADLs/Self Care Home Management;Aquatic Therapy;Cryotherapy;Moist Heat;Traction;Ultrasound;Electrical Stimulation;Gait training;Stair training;Functional mobility training;Therapeutic activities;Therapeutic exercise;Balance training;Neuromuscular re-education;Patient/family education;Manual techniques;Passive range of motion;Dry needling;Taping    PT Next Visit Plan  begin aquatic therapy    PT Home Exercise Plan  abdominal bracing in hooklying     Consulted and Agree with Plan of Care  Patient       Patient will benefit from skilled therapeutic intervention in order to improve the following deficits and impairments:  Abnormal gait, Decreased activity tolerance, Decreased endurance, Decreased mobility, Decreased range of motion, Decreased strength, Difficulty walking, Hypomobility, Increased muscle spasms, Pain, Impaired flexibility, Improper body mechanics, Postural dysfunction  Visit Diagnosis: Chronic midline low back pain, with sciatica presence unspecified  Muscle weakness  (generalized)  Difficulty in walking, not elsewhere classified  Chronic right-sided low back pain, with sciatica presence unspecified     Problem List Patient Active Problem List   Diagnosis Date Noted  . Deep vein thrombosis (DVT) ([place]) 04/08/2015  . May-Thurner syndrome 04/08/2015  . Overdose 02/09/2015  . Ache in joint 11/21/2013  . Arteriovenous fistula ([place]) 06/20/2013  . Absolute anemia 10/19/2012  . Recurrent major depressive disorder, in partial remission ([place]) 07/13/2012  . Chronic pain 01/22/2012  . Clinical depression 01/22/2012  . Disease of lung 01/22/2012  . Fibromyalgia 01/22/2012  . LBP (low back pain) 01/22/2012  . Cannot sleep 01/22/2012  . Emotional neurotic disorder 01/22/2012  . Cannabis abuse, continuous 01/22/2012  . Pulmonary embolism with infarction ([place]) 01/22/2012  . Esophagitis, reflux 01/22/2012  . Current tobacco use 01/22/2012  . Narcotic drug use 11/02/2010  . Post-phlebitic syndrome 07/05/2010  . Borderline personality disorder ([place]) 06/15/2010  . Chronic deep vein thrombosis (DVT) of lower extremity ([place]) 06/15/2010  . Hereditary and idiopathic neuropathy 06/15/2010  . Neurosis, posttraumatic 06/15/2010   Janna Arch, PT, DPT   Janna Arch 05/01/2017, 11:22 AM  [place] MAIN [facility] SERVICES [address]  [place], [place], [address] Phone: [phone]   Fax:  [phone]  Name: GENIYA FULGHAM MRN: [id number] Date of Birth: [birth year]

## 2017-05-10 ENCOUNTER — Other Ambulatory Visit: Payer: Self-pay

## 2017-05-10 ENCOUNTER — Ambulatory Visit: Payer: PPO | Attending: Psychiatry

## 2017-05-10 DIAGNOSIS — M6281 Muscle weakness (generalized): Secondary | ICD-10-CM | POA: Insufficient documentation

## 2017-05-10 DIAGNOSIS — G8929 Other chronic pain: Secondary | ICD-10-CM | POA: Diagnosis not present

## 2017-05-10 DIAGNOSIS — R262 Difficulty in walking, not elsewhere classified: Secondary | ICD-10-CM | POA: Diagnosis not present

## 2017-05-10 DIAGNOSIS — M545 Low back pain: Secondary | ICD-10-CM | POA: Diagnosis not present

## 2017-05-10 NOTE — Therapy (Signed)
[place] MAIN [facility] SERVICES [address] [place], [place], [address] Phone: [phone]   Fax:  [phone]  Physical Therapy Treatment  Patient Details  Name: Lindsey Willis MRN: [id number] Date of Birth: August 26, 1975 Referring Provider: Fabio Bering   Encounter Date: 05/10/2017  PT End of Session - 05/10/17 1527    Visit Number  25    Number of Visits  36    Date for PT Re-Evaluation  05/23/17    PT Start Time  1215    PT Stop Time  1320    PT Time Calculation (min)  65 min    Activity Tolerance  Patient tolerated treatment well    Behavior During Therapy  [facility] for tasks assessed/performed       Past Medical History:  Diagnosis Date  . Anemia   . Asthma   . Borderline personality disorder ([place])   . Depression   . DVT of leg (deep venous thrombosis) ([place])   . Fibromyalgia   . May-Thurner syndrome   . PTSD (post-traumatic stress disorder)     Past Surgical History:  Procedure Laterality Date  . BREAST CYST ASPIRATION Right    neg  . CHOLECYSTECTOMY    . IVC FILTER PLACEMENT ([facility] HX)    . LEG SURGERY    . TUBAL LIGATION      There were no vitals filed for this visit.  Subjective Assessment - 05/10/17 1524    Subjective  Pt reports 6/10 pain R hip (usual pain); pt does note a mild increased awareness of upper L thigh pain ('where blood clots are"). Pt also feels ant'er distal LLE has mild discoloration at shin. Upon observation could not visually see a difference, but advised pt if this persisted to discuss with vascular MD. Pt agreeable.     Pertinent History  Pt has had chronic low back pain for 10+ years.  Pt has hx of anxiety, asthma, chronic LBP, major depressive disorder, fibromyalgia, May-Thurner Syndrome, RLS and PTSD.  Pt has had a hx of LE DVT's and pulmonary embolism with multiple stene placements and thrombolysis in B LE's with last stent placement occuring in 2012.  Pt is currently on anticoagulants. Pt has  had 2 nerve blocks for leg pain in the past resulting in decreased sensation and function of L LE.  Pt also has decreased sensation of B feet.      Enters/exits via steps Participates in the following  Ambulation  Fwd 4 L  Side 4 L   Side with mini squat, 2L  Core stabilization with UE work, 2 x 20 each  Mitts   Sh abd/add   Sh flex/ext   Sh horiz abd/add  Bench LE strength, 2 x 10 ea  Resisted hip ext using noodle, B  Suspended position (increased time for education)  Performed ea in 30 sec increments, no rest, 1 min rest btn  Jog  Jack  Ski  X Ski performed ipsilateral. R/L  Stretching, 3 x 15 sec ea  Hamstrings  SKTC  Piriformis, cross body and fig 4  hip flexor at step                         PT Education - 05/10/17 1527    Education provided  Yes    Education Details  core stabilization using Mitts; hip extension exercise, suspended position work       PT Short Term Goals - 05/01/17 1120  PT SHORT TERM GOAL #1   Title  Pt will demonstrate improved pain levels with activity to 6/10 or <.    Baseline  Worst: 9/10, Best: 6/10, 8/10 right hip pain 2/20: worst pain 8/10; 326: worst 7/10     Time  4    Period  Weeks    Status  Partially Met      PT SHORT TERM GOAL #2   Title  Pt will improve Mod ODI to 60% or less indicating severe disability in order to demonstrate self-reported improvements of pain and mobility.    Baseline  11/15/16: 72% indicating extreme disability, 52 % 2/20: 44%    Time  8    Period  Weeks    Status  Achieved        PT Long Term Goals - 05/01/17 1054      PT LONG TERM GOAL #1   Title  Pt will demonstrate independence with HEP in order to manage pain and other symptoms.    Baseline  Patient is in aquatic therapy and is doing her exercises outside of the pool    Time  8    Period  Weeks    Status  Partially Met      PT LONG TERM GOAL #2   Title  Pt will improve gait speed to at least 1.0 m/s in order  to demonstrate improvements in LE strength and decreaed pain levels in order to ambulate within limited community ambulator status.    Baseline  11/15/16: 0.5 m/s, .36 m/sec; .45 sec 01/23/17, .41 m/sec 2/20:  .62ms 3/26: .55 m/s    Time  8    Period  Weeks    Status  Partially Met      PT LONG TERM GOAL #3   Title  Pt will reduce time to complete 5x sit to stand to <30 seconds in order to demonstrate improved LE strength and decreaed levels of pain, demonstrating improvement in functional mobility.    Baseline  11/15/16: 50.38 sec, 50.54 sec:01/23/17 45.33 sec, 41.66 sec 2/20: 34.8 seconds 3/26: 30 seconds    Time  8    Period  Weeks    Status  Achieved      PT LONG TERM GOAL #4   Title  Pt will improve Mod ODI to 40% or < indicating moderate level of disability in order to demonstrate self-reported improvements in pain and mobility.    Baseline  11/15/16: 72% indicating extreme disability, 52 % ODI; 44% ODI, 48% indicating moderate disability; 2/20: 44% 3/26: 44%    Time  8    Period  Weeks    Status  Partially Met      PT LONG TERM GOAL #5   Title  Pt will improve LE strength to at least 4/5 in all limited planes in order to improve mobility, gait, and ADL ability.    Baseline  Gross strength assessment: 3/5 - 2/5 in L hip blex and B hip ext 2/20 : pain limited MMT     Time  8    Period  Weeks    Status  Partially Met    Target Date  06/26/17      PT LONG TERM GOAL #6   Title  Pt will reduce time to complete 5x sit to stand to <20 seconds in order to demonstrate improved LE strength and decreaed levels of pain, demonstrating improvement in functional mobility.    Baseline  3/26: 30 seconds    Time  8    Period  Weeks    Status  New    Target Date  06/26/17      PT LONG TERM GOAL #7   Title  Patient will reduce timed up and go to <11 seconds to reduce fall risk and demonstrate improved transfer/gait ability    Baseline  3/26: 22 seconds    Time  8    Period  Weeks     Status  New    Target Date  06/26/17            Plan - 05/10/17 1528    Clinical Impression Statement  Pt tolerated new exercises well without increase in pain or fatigue. Demonstrated good ability/tolerance of suspended work 2.5 minutes at a time with noted effort/work. Pt pleased with the ability to tolerate increased work in water environment.     Rehab Potential  Fair    Clinical Impairments Affecting Rehab Potential  age, perception of dx, comorbidities    PT Frequency  2x / week    PT Duration  8 weeks    PT Treatment/Interventions  ADLs/Self Care Home Management;Aquatic Therapy;Cryotherapy;Moist Heat;Traction;Ultrasound;Electrical Stimulation;Gait training;Stair training;Functional mobility training;Therapeutic activities;Therapeutic exercise;Balance training;Neuromuscular re-education;Patient/family education;Manual techniques;Passive range of motion;Dry needling;Taping    PT Next Visit Plan  begin aquatic therapy    PT Home Exercise Plan  abdominal bracing in hooklying     Consulted and Agree with Plan of Care  Patient       Patient will benefit from skilled therapeutic intervention in order to improve the following deficits and impairments:  Abnormal gait, Decreased activity tolerance, Decreased endurance, Decreased mobility, Decreased range of motion, Decreased strength, Difficulty walking, Hypomobility, Increased muscle spasms, Pain, Impaired flexibility, Improper body mechanics, Postural dysfunction  Visit Diagnosis: Chronic midline low back pain, with sciatica presence unspecified  Muscle weakness (generalized)  Difficulty in walking, not elsewhere classified  Chronic right-sided low back pain, with sciatica presence unspecified     Problem List Patient Active Problem List   Diagnosis Date Noted  . Deep vein thrombosis (DVT) ([place]) 04/08/2015  . May-Thurner syndrome 04/08/2015  . Overdose 02/09/2015  . Ache in joint 11/21/2013  . Arteriovenous fistula ([place])  06/20/2013  . Absolute anemia 10/19/2012  . Recurrent major depressive disorder, in partial remission ([place]) 07/13/2012  . Chronic pain 01/22/2012  . Clinical depression 01/22/2012  . Disease of lung 01/22/2012  . Fibromyalgia 01/22/2012  . LBP (low back pain) 01/22/2012  . Cannot sleep 01/22/2012  . Emotional neurotic disorder 01/22/2012  . Cannabis abuse, continuous 01/22/2012  . Pulmonary embolism with infarction ([place]) 01/22/2012  . Esophagitis, reflux 01/22/2012  . Current tobacco use 01/22/2012  . Narcotic drug use 11/02/2010  . Post-phlebitic syndrome 07/05/2010  . Borderline personality disorder ([place]) 06/15/2010  . Chronic deep vein thrombosis (DVT) of lower extremity ([place]) 06/15/2010  . Hereditary and idiopathic neuropathy 06/15/2010  . Neurosis, posttraumatic 06/15/2010    Lindsey Willis 05/10/2017, 3:31 PM  [place] MAIN [facility] SERVICES [address] [place], [place], [address] Phone: [phone]   Fax:  [phone]  Name: Lindsey Willis MRN: [id number] Date of Birth: 1975/03/29

## 2017-05-17 ENCOUNTER — Ambulatory Visit: Payer: PPO

## 2017-05-17 ENCOUNTER — Other Ambulatory Visit: Payer: Self-pay

## 2017-05-17 DIAGNOSIS — M545 Low back pain: Secondary | ICD-10-CM | POA: Diagnosis not present

## 2017-05-17 DIAGNOSIS — G8929 Other chronic pain: Secondary | ICD-10-CM

## 2017-05-17 DIAGNOSIS — R262 Difficulty in walking, not elsewhere classified: Secondary | ICD-10-CM

## 2017-05-17 DIAGNOSIS — M6281 Muscle weakness (generalized): Secondary | ICD-10-CM

## 2017-05-17 NOTE — Therapy (Signed)
[place] MAIN [facility] SERVICES [address] [place], [place], [address] Phone: [phone]   Fax:  [phone]  Physical Therapy Treatment  Patient Details  Name: Lindsey Willis MRN: [id number] Date of Birth: Feb 04, 1976 Referring Provider: Fabio Bering   Encounter Date: 05/17/2017  PT End of Session - 05/17/17 1558    Visit Number  26    Number of Visits  36    Date for PT Re-Evaluation  05/23/17    PT Start Time  0815    PT Stop Time  0915    PT Time Calculation (min)  60 min    Activity Tolerance  Patient tolerated treatment well    Behavior During Therapy  [facility] for tasks assessed/performed       Past Medical History:  Diagnosis Date  . Anemia   . Asthma   . Borderline personality disorder ([place])   . Depression   . DVT of leg (deep venous thrombosis) ([place])   . Fibromyalgia   . May-Thurner syndrome   . PTSD (post-traumatic stress disorder)     Past Surgical History:  Procedure Laterality Date  . BREAST CYST ASPIRATION Right    neg  . CHOLECYSTECTOMY    . IVC FILTER PLACEMENT ([facility] HX)    . LEG SURGERY    . TUBAL LIGATION      There were no vitals filed for this visit.  Subjective Assessment - 05/17/17 1556    Subjective  No new voiced complaints. Pt notes usual discomfort in R hip/buttock. Deep hip pain on R has improved over the course of aquatic therapy.     Pertinent History  Pt has had chronic low back pain for 10+ years.  Pt has hx of anxiety, asthma, chronic LBP, major depressive disorder, fibromyalgia, May-Thurner Syndrome, RLS and PTSD.  Pt has had a hx of LE DVT's and pulmonary embolism with multiple stene placements and thrombolysis in B LE's with last stent placement occuring in 2012.  Pt is currently on anticoagulants. Pt has had 2 nerve blocks for leg pain in the past resulting in decreased sensation and function of L LE.  Pt also has decreased sensation of B feet.      Enters/exits via  steps Participates in the following.  Ambulation  4 L fwd  4 L side with minisquat  Core with UE work with mitts, 30x ea  Sh horiz abd/add  sh abd/add \  sh flex/ext   Core  fall to recover (UE only) fwd, 20x  fall to recover (UE only) side, B, 2 x 10 ea  Bench, core and LE, 2 min ea (15 sec rest in between)  1# wts    Bike    Flutter, feet PF    Scissor, feet DF  Core at bench, 2 x 10 ea  1#    SKTC, B   Outward LE circles, B  Stretching 3 x 10 sec ea  SKTC  piriformis cross body  piriformis fig 4  hamstrings                          PT Education - 05/17/17 1557    Education provided  Yes    Education Details  Fall to recover for core work, fwd and side.        PT Short Term Goals - 05/01/17 1120      PT SHORT TERM GOAL #1   Title  Pt will  demonstrate improved pain levels with activity to 6/10 or <.    Baseline  Worst: 9/10, Best: 6/10, 8/10 right hip pain 2/20: worst pain 8/10; 326: worst 7/10     Time  4    Period  Weeks    Status  Partially Met      PT SHORT TERM GOAL #2   Title  Pt will improve Mod ODI to 60% or less indicating severe disability in order to demonstrate self-reported improvements of pain and mobility.    Baseline  11/15/16: 72% indicating extreme disability, 52 % 2/20: 44%    Time  8    Period  Weeks    Status  Achieved        PT Long Term Goals - 05/01/17 1054      PT LONG TERM GOAL #1   Title  Pt will demonstrate independence with HEP in order to manage pain and other symptoms.    Baseline  Patient is in aquatic therapy and is doing her exercises outside of the pool    Time  8    Period  Weeks    Status  Partially Met      PT LONG TERM GOAL #2   Title  Pt will improve gait speed to at least 1.0 m/s in order to demonstrate improvements in LE strength and decreaed pain levels in order to ambulate within limited community ambulator status.    Baseline  11/15/16: 0.5 m/s, .36 m/sec; .45 sec 01/23/17, .41  m/sec 2/20:  .21ms 3/26: .55 m/s    Time  8    Period  Weeks    Status  Partially Met      PT LONG TERM GOAL #3   Title  Pt will reduce time to complete 5x sit to stand to <30 seconds in order to demonstrate improved LE strength and decreaed levels of pain, demonstrating improvement in functional mobility.    Baseline  11/15/16: 50.38 sec, 50.54 sec:01/23/17 45.33 sec, 41.66 sec 2/20: 34.8 seconds 3/26: 30 seconds    Time  8    Period  Weeks    Status  Achieved      PT LONG TERM GOAL #4   Title  Pt will improve Mod ODI to 40% or < indicating moderate level of disability in order to demonstrate self-reported improvements in pain and mobility.    Baseline  11/15/16: 72% indicating extreme disability, 52 % ODI; 44% ODI, 48% indicating moderate disability; 2/20: 44% 3/26: 44%    Time  8    Period  Weeks    Status  Partially Met      PT LONG TERM GOAL #5   Title  Pt will improve LE strength to at least 4/5 in all limited planes in order to improve mobility, gait, and ADL ability.    Baseline  Gross strength assessment: 3/5 - 2/5 in L hip blex and B hip ext 2/20 : pain limited MMT     Time  8    Period  Weeks    Status  Partially Met    Target Date  06/26/17      PT LONG TERM GOAL #6   Title  Pt will reduce time to complete 5x sit to stand to <20 seconds in order to demonstrate improved LE strength and decreaed levels of pain, demonstrating improvement in functional mobility.    Baseline  3/26: 30 seconds    Time  8    Period  Weeks  Status  New    Target Date  06/26/17      PT LONG TERM GOAL #7   Title  Patient will reduce timed up and go to <11 seconds to reduce fall risk and demonstrate improved transfer/gait ability    Baseline  3/26: 22 seconds    Time  8    Period  Weeks    Status  New    Target Date  06/26/17            Plan - 05/17/17 1559    Clinical Impression Statement  Pt tolerated new exercises well and addition of light resistance to LE and core work. Pt  notes much greater awareness of core and demonstrates improved core stabilization with exercises. Plan to continue aquatic program to progress strengthening/stretching through core and LEs for improved overall funtion and tolerace of household and community activity.     Rehab Potential  Fair    Clinical Impairments Affecting Rehab Potential  age, perception of dx, comorbidities    PT Frequency  2x / week    PT Duration  8 weeks    PT Treatment/Interventions  ADLs/Self Care Home Management;Aquatic Therapy;Cryotherapy;Moist Heat;Traction;Ultrasound;Electrical Stimulation;Gait training;Stair training;Functional mobility training;Therapeutic activities;Therapeutic exercise;Balance training;Neuromuscular re-education;Patient/family education;Manual techniques;Passive range of motion;Dry needling;Taping    PT Next Visit Plan  begin aquatic therapy    PT Home Exercise Plan  abdominal bracing in hooklying     Consulted and Agree with Plan of Care  Patient       Patient will benefit from skilled therapeutic intervention in order to improve the following deficits and impairments:  Abnormal gait, Decreased activity tolerance, Decreased endurance, Decreased mobility, Decreased range of motion, Decreased strength, Difficulty walking, Hypomobility, Increased muscle spasms, Pain, Impaired flexibility, Improper body mechanics, Postural dysfunction  Visit Diagnosis: Chronic midline low back pain, with sciatica presence unspecified  Muscle weakness (generalized)  Difficulty in walking, not elsewhere classified  Chronic right-sided low back pain, with sciatica presence unspecified     Problem List Patient Active Problem List   Diagnosis Date Noted  . Deep vein thrombosis (DVT) ([place]) 04/08/2015  . May-Thurner syndrome 04/08/2015  . Overdose 02/09/2015  . Ache in joint 11/21/2013  . Arteriovenous fistula ([place]) 06/20/2013  . Absolute anemia 10/19/2012  . Recurrent major depressive disorder, in partial  remission ([place]) 07/13/2012  . Chronic pain 01/22/2012  . Clinical depression 01/22/2012  . Disease of lung 01/22/2012  . Fibromyalgia 01/22/2012  . LBP (low back pain) 01/22/2012  . Cannot sleep 01/22/2012  . Emotional neurotic disorder 01/22/2012  . Cannabis abuse, continuous 01/22/2012  . Pulmonary embolism with infarction ([place]) 01/22/2012  . Esophagitis, reflux 01/22/2012  . Current tobacco use 01/22/2012  . Narcotic drug use 11/02/2010  . Post-phlebitic syndrome 07/05/2010  . Borderline personality disorder ([place]) 06/15/2010  . Chronic deep vein thrombosis (DVT) of lower extremity ([place]) 06/15/2010  . Hereditary and idiopathic neuropathy 06/15/2010  . Neurosis, posttraumatic 06/15/2010    Larae Grooms 05/17/2017, 4:07 PM  [place] MAIN [facility] SERVICES [address] [place], [place], [address] Phone: [phone]   Fax:  [phone]  Name: KETINA MARS MRN: [id number] Date of Birth: 1976/01/31

## 2017-05-24 ENCOUNTER — Ambulatory Visit: Payer: PPO

## 2017-05-24 DIAGNOSIS — M545 Low back pain: Principal | ICD-10-CM

## 2017-05-24 DIAGNOSIS — G8929 Other chronic pain: Secondary | ICD-10-CM

## 2017-05-24 DIAGNOSIS — M6281 Muscle weakness (generalized): Secondary | ICD-10-CM

## 2017-05-24 DIAGNOSIS — R262 Difficulty in walking, not elsewhere classified: Secondary | ICD-10-CM

## 2017-05-28 ENCOUNTER — Other Ambulatory Visit: Payer: Self-pay

## 2017-05-28 NOTE — Therapy (Signed)
[place] MAIN [facility] SERVICES [address] [place], [place], [address] Phone: [phone]   Fax:  [phone]  Physical Therapy Treatment  Patient Details  Name: Lindsey Willis MRN: [id number] Date of Birth: Dec 01, 1975 Referring Provider: Fabio Bering   Encounter Date: 05/24/2017  PT End of Session - 05/28/17 1326    Visit Number  27    Number of Visits  36    Date for PT Re-Evaluation  05/23/17    PT Start Time  0850    PT Stop Time  0950    PT Time Calculation (min)  60 min    Activity Tolerance  Patient tolerated treatment well    Behavior During Therapy  [facility] for tasks assessed/performed       Past Medical History:  Diagnosis Date  . Anemia   . Asthma   . Borderline personality disorder ([place])   . Depression   . DVT of leg (deep venous thrombosis) ([place])   . Fibromyalgia   . May-Thurner syndrome   . PTSD (post-traumatic stress disorder)     Past Surgical History:  Procedure Laterality Date  . BREAST CYST ASPIRATION Right    neg  . CHOLECYSTECTOMY    . IVC FILTER PLACEMENT ([facility] HX)    . LEG SURGERY    . TUBAL LIGATION      There were no vitals filed for this visit.  Subjective Assessment - 05/28/17 1323    Subjective  No new voiced complaints today. Usual R hip/buttock pain 6/10, but overall notes improved tolerance of household activity and walking tolerance.     Pertinent History  Pt has had chronic low back pain for 10+ years.  Pt has hx of anxiety, asthma, chronic LBP, major depressive disorder, fibromyalgia, May-Thurner Syndrome, RLS and PTSD.  Pt has had a hx of LE DVT's and pulmonary embolism with multiple stene placements and thrombolysis in B LE's with last stent placement occuring in 2012.  Pt is currently on anticoagulants. Pt has had 2 nerve blocks for leg pain in the past resulting in decreased sensation and function of L LE.  Pt also has decreased sensation of B feet.      Enters/exits via  steps Participates in the following  Ambulation  fwd 4 L  side 4 L   Core with UE strengthening, green dumbbells, 2 x 10 ea  Triceps press downs  Sh abd/add  Sh flex/ext  Sh horiz abd/add   Core with LE strengthening, 20x ea  Rail   Hip abd/add   Hip flex/ext   Sumo squat  Bench   STS, no UE, no seated wt shift  Bench, core/LE  Bike, 3 min  Scissor, 3 min  hip ER circles 2 x 10, B  hip SKTC, 2 x 10, B  Suspended position, 2 x 1 min round ea with 15 sec rest btn rounds  Jog   Jack  Ski  X jack  Partial step up, B 20x ea    Stretching, 3 x 10 sec ea  Ham at step  Hip flexor at step  SKTC  Piriformis, cross body  Piriformis, fig 4                          PT Education - 05/28/17 1324    Education provided  Yes    Education Details  Bench STS squat without seated weight shift, continued suspended position work, partial step ups  PT Short Term Goals - 05/01/17 1120      PT SHORT TERM GOAL #1   Title  Pt will demonstrate improved pain levels with activity to 6/10 or <.    Baseline  Worst: 9/10, Best: 6/10, 8/10 right hip pain 2/20: worst pain 8/10; 326: worst 7/10     Time  4    Period  Weeks    Status  Partially Met      PT SHORT TERM GOAL #2   Title  Pt will improve Mod ODI to 60% or less indicating severe disability in order to demonstrate self-reported improvements of pain and mobility.    Baseline  11/15/16: 72% indicating extreme disability, 52 % 2/20: 44%    Time  8    Period  Weeks    Status  Achieved        PT Long Term Goals - 05/01/17 1054      PT LONG TERM GOAL #1   Title  Pt will demonstrate independence with HEP in order to manage pain and other symptoms.    Baseline  Patient is in aquatic therapy and is doing her exercises outside of the pool    Time  8    Period  Weeks    Status  Partially Met      PT LONG TERM GOAL #2   Title  Pt will improve gait speed to at least 1.0 m/s in order to demonstrate  improvements in LE strength and decreaed pain levels in order to ambulate within limited community ambulator status.    Baseline  11/15/16: 0.5 m/s, .36 m/sec; .45 sec 01/23/17, .41 m/sec 2/20:  .21ms 3/26: .55 m/s    Time  8    Period  Weeks    Status  Partially Met      PT LONG TERM GOAL #3   Title  Pt will reduce time to complete 5x sit to stand to <30 seconds in order to demonstrate improved LE strength and decreaed levels of pain, demonstrating improvement in functional mobility.    Baseline  11/15/16: 50.38 sec, 50.54 sec:01/23/17 45.33 sec, 41.66 sec 2/20: 34.8 seconds 3/26: 30 seconds    Time  8    Period  Weeks    Status  Achieved      PT LONG TERM GOAL #4   Title  Pt will improve Mod ODI to 40% or < indicating moderate level of disability in order to demonstrate self-reported improvements in pain and mobility.    Baseline  11/15/16: 72% indicating extreme disability, 52 % ODI; 44% ODI, 48% indicating moderate disability; 2/20: 44% 3/26: 44%    Time  8    Period  Weeks    Status  Partially Met      PT LONG TERM GOAL #5   Title  Pt will improve LE strength to at least 4/5 in all limited planes in order to improve mobility, gait, and ADL ability.    Baseline  Gross strength assessment: 3/5 - 2/5 in L hip blex and B hip ext 2/20 : pain limited MMT     Time  8    Period  Weeks    Status  Partially Met    Target Date  06/26/17      PT LONG TERM GOAL #6   Title  Pt will reduce time to complete 5x sit to stand to <20 seconds in order to demonstrate improved LE strength and decreaed levels of pain, demonstrating improvement in functional mobility.  Baseline  3/26: 30 seconds    Time  8    Period  Weeks    Status  New    Target Date  06/26/17      PT LONG TERM GOAL #7   Title  Patient will reduce timed up and go to <11 seconds to reduce fall risk and demonstrate improved transfer/gait ability    Baseline  3/26: 22 seconds    Time  8    Period  Weeks    Status  New     Target Date  06/26/17              Patient will benefit from skilled therapeutic intervention in order to improve the following deficits and impairments:  Abnormal gait, Decreased activity tolerance, Decreased endurance, Decreased mobility, Decreased range of motion, Decreased strength, Difficulty walking, Hypomobility, Increased muscle spasms, Pain, Impaired flexibility, Improper body mechanics, Postural dysfunction  Visit Diagnosis: Chronic midline low back pain, with sciatica presence unspecified  Muscle weakness (generalized)  Difficulty in walking, not elsewhere classified  Chronic right-sided low back pain, with sciatica presence unspecified     Problem List Patient Active Problem List   Diagnosis Date Noted  . Deep vein thrombosis (DVT) ([place]) 04/08/2015  . May-Thurner syndrome 04/08/2015  . Overdose 02/09/2015  . Ache in joint 11/21/2013  . Arteriovenous fistula ([place]) 06/20/2013  . Absolute anemia 10/19/2012  . Recurrent major depressive disorder, in partial remission ([place]) 07/13/2012  . Chronic pain 01/22/2012  . Clinical depression 01/22/2012  . Disease of lung 01/22/2012  . Fibromyalgia 01/22/2012  . LBP (low back pain) 01/22/2012  . Cannot sleep 01/22/2012  . Emotional neurotic disorder 01/22/2012  . Cannabis abuse, continuous 01/22/2012  . Pulmonary embolism with infarction ([place]) 01/22/2012  . Esophagitis, reflux 01/22/2012  . Current tobacco use 01/22/2012  . Narcotic drug use 11/02/2010  . Post-phlebitic syndrome 07/05/2010  . Borderline personality disorder ([place]) 06/15/2010  . Chronic deep vein thrombosis (DVT) of lower extremity ([place]) 06/15/2010  . Hereditary and idiopathic neuropathy 06/15/2010  . Neurosis, posttraumatic 06/15/2010    Lindsey Willis 05/28/2017, 1:30 PM  [place] MAIN [facility] SERVICES [address] [place], [place], [address] Phone: [phone]   Fax:  [phone]  Name: Lindsey Willis MRN: [id number] Date of Birth: 01-Oct-1975

## 2017-05-30 ENCOUNTER — Ambulatory Visit: Payer: PPO

## 2017-05-31 ENCOUNTER — Ambulatory Visit: Payer: PPO

## 2017-06-05 ENCOUNTER — Ambulatory Visit: Payer: PPO

## 2017-06-05 DIAGNOSIS — M545 Low back pain: Secondary | ICD-10-CM | POA: Diagnosis not present

## 2017-06-05 DIAGNOSIS — R262 Difficulty in walking, not elsewhere classified: Secondary | ICD-10-CM

## 2017-06-05 DIAGNOSIS — M6281 Muscle weakness (generalized): Secondary | ICD-10-CM

## 2017-06-05 DIAGNOSIS — G8929 Other chronic pain: Secondary | ICD-10-CM

## 2017-06-05 NOTE — Therapy (Signed)
[place] MAIN [facility] SERVICES [address] [place], [place], [address] Phone: [phone]   Fax:  [phone]  Physical Therapy Treatment  Patient Details  Name: Lindsey Willis MRN: [id number] Date of Birth: [birth year] Referring Provider: Fabio Bering   Encounter Date: 06/05/2017  PT End of Session - 06/05/17 1640    Visit Number  28    Number of Visits  44    Date for PT Re-Evaluation  07/31/17    PT Start Time  9735    PT Stop Time  1700    PT Time Calculation (min)  43 min    Activity Tolerance  Patient tolerated treatment well    Behavior During Therapy  [facility] for tasks assessed/performed       Past Medical History:  Diagnosis Date  . Anemia   . Asthma   . Borderline personality disorder ([place])   . Depression   . DVT of leg (deep venous thrombosis) ([place])   . Fibromyalgia   . May-Thurner syndrome   . PTSD (post-traumatic stress disorder)     Past Surgical History:  Procedure Laterality Date  . BREAST CYST ASPIRATION Right    neg  . CHOLECYSTECTOMY    . IVC FILTER PLACEMENT ([facility] HX)    . LEG SURGERY    . TUBAL LIGATION      There were no vitals filed for this visit.   Subjective Assessment - 06/05/17 1623    Subjective  Patient reports that pool has been helping. Having more good days than bad days now. Wants to continue due to her progress and ability to move more days without pain.     Pertinent History  Pt has had chronic low back pain for 10+ years.  Pt has hx of anxiety, asthma, chronic LBP, major depressive disorder, fibromyalgia, May-Thurner Syndrome, RLS and PTSD.  Pt has had a hx of LE DVT's and pulmonary embolism with multiple stene placements and thrombolysis in B LE's with last stent placement occuring in 2012.  Pt is currently on anticoagulants. Pt has had 2 nerve blocks for leg pain in the past resulting in decreased sensation and function of L LE.  Pt also has decreased sensation of B feet.    How  long can you sit comfortably?  15-20 minutes    How long can you stand comfortably?  10-15 minutes     How long can you walk comfortably?  10-15 minutes    Patient Stated Goals  continue reducing pain and move better.     Currently in Pain?  Yes    Pain Score  5     Pain Location  Back    Pain Orientation  Lower    Pain Descriptors / Indicators  Aching    Pain Type  Acute pain    Pain Onset  More than a month ago    Pain Frequency  Constant      Recert/dc MODI: 32% VAS:  Worst pain: 7/10:  Best pain: 3/10 Has pain 2x/week was getting it 4-5 days /week prior  10 MWT =.84ms 5XSTS: 25 seconds             TUG: 20 seconds   Short arc distraction RLE and LLE 4x15 seconds Long arc distraction RLE and LLE  4x 15 seconds  Hamstring stretch 2x30 seconds                        PT  Education - 06/05/17 1640    Education provided  Yes    Education Details  POC, continued aquatics, goal progression    Person(s) Educated  Patient    Methods  Explanation;Demonstration;Verbal cues    Comprehension  Verbalized understanding;Returned demonstration       PT Short Term Goals - 06/05/17 1630      PT SHORT TERM GOAL #1   Title  Pt will demonstrate improved pain levels with activity to 6/10 or <.    Baseline  Worst: 9/10, Best: 6/10, 8/10 right hip pain 2/20: worst pain 8/10; 326: worst 7/10 4/30: 7/10    Time  4    Period  Weeks    Status  Partially Met    Target Date  07/03/17      PT SHORT TERM GOAL #2   Title  Pt will improve Mod ODI to 60% or less indicating severe disability in order to demonstrate self-reported improvements of pain and mobility.    Baseline  11/15/16: 72% indicating extreme disability, 52 % 2/20: 44%    Time  8    Period  Weeks    Status  Achieved        PT Long Term Goals - 06/05/17 1630      PT LONG TERM GOAL #1   Title  Pt will demonstrate independence with HEP in order to manage pain and other symptoms.    Baseline  Patient is in  aquatic therapy and is doing her exercises outside of the pool    Time  8    Period  Weeks    Status  Partially Met    Target Date  07/31/17      PT LONG TERM GOAL #2   Title  Pt will improve gait speed to at least 1.0 m/s in order to demonstrate improvements in LE strength and decreaed pain levels in order to ambulate within limited community ambulator status.    Baseline  11/15/16: 0.5 m/s, .36 m/sec; .45 sec 01/23/17, .41 m/sec 2/20:  .22ms 3/26: .55 m/s 4/30: .58 m/s     Time  8    Period  Weeks    Status  Partially Met    Target Date  07/31/17      PT LONG TERM GOAL #3   Title  Pt will reduce time to complete 5x sit to stand to <30 seconds in order to demonstrate improved LE strength and decreaed levels of pain, demonstrating improvement in functional mobility.    Baseline  11/15/16: 50.38 sec, 50.54 sec:01/23/17 45.33 sec, 41.66 sec 2/20: 34.8 seconds 3/26: 30 seconds    Time  8    Period  Weeks    Status  Achieved      PT LONG TERM GOAL #4   Title  Pt will improve Mod ODI to 40% or < indicating moderate level of disability in order to demonstrate self-reported improvements in pain and mobility.    Baseline  11/15/16: 72% indicating extreme disability, 52 % ODI; 44% ODI, 48% indicating moderate disability; 2/20: 44% 3/26: 44% 4/30: 38%    Time  8    Period  Weeks    Status  Achieved      PT LONG TERM GOAL #5   Title  Pt will improve LE strength to at least 4/5 in all limited planes in order to improve mobility, gait, and ADL ability.    Baseline  Gross strength assessment: 3/5 - 2/5 in L hip blex and B hip  ext 2/20 : pain limited MMT     Time  8    Period  Weeks    Status  Partially Met    Target Date  07/31/17      Additional Long Term Goals   Additional Long Term Goals  Yes      PT LONG TERM GOAL #6   Title  Pt will reduce time to complete 5x sit to stand to <20 seconds in order to demonstrate improved LE strength and decreaed levels of pain, demonstrating improvement  in functional mobility.    Baseline  3/26: 30 seconds 4/30: 25 seconds    Time  8    Period  Weeks    Status  Partially Met    Target Date  07/31/17      PT LONG TERM GOAL #7   Title  Patient will reduce timed up and go to <11 seconds to reduce fall risk and demonstrate improved transfer/gait ability    Baseline  3/26: 22 seconds 4/30: 20 seconds    Time  8    Period  Weeks    Status  Partially Met    Target Date  07/31/17      PT LONG TERM GOAL #8   Title  Patient will report having "bad pain" day <2x/week demonstrating improved quality of life.     Baseline  4/30: 2x/week    Time  8    Period  Weeks    Status  New    Target Date  07/31/17            Plan - 06/05/17 1701    Clinical Impression Statement  Patient demonstrating progression towards goals at this time. Can sit now for 15-20 minutes now instead of the 10 minutes previously. Can stand and walk for longer now than initially reported. Distance continues to challenge patient at this time. Now only has pain 2x/week instead of the normal 4-5x/week since last month. 10MWT improved to .26ms with cane Patient performed TUG in 20 seconds with SPC. MODI:38% and 5x STS 25 seconds.  Aquatic PT will continue to benefit patient to improve pain, reduce frequency of pain, and improve functional mobility for increased quality of life.     Rehab Potential  Fair    Clinical Impairments Affecting Rehab Potential  age, perception of dx, comorbidities    PT Frequency  2x / week    PT Duration  8 weeks    PT Treatment/Interventions  ADLs/Self Care Home Management;Aquatic Therapy;Cryotherapy;Moist Heat;Traction;Ultrasound;Electrical Stimulation;Gait training;Stair training;Functional mobility training;Therapeutic activities;Therapeutic exercise;Balance training;Neuromuscular re-education;Patient/family education;Manual techniques;Passive range of motion;Dry needling;Taping    PT Next Visit Plan  begin aquatic therapy    PT Home Exercise  Plan  abdominal bracing in hooklying     Consulted and Agree with Plan of Care  Patient       Patient will benefit from skilled therapeutic intervention in order to improve the following deficits and impairments:  Abnormal gait, Decreased activity tolerance, Decreased endurance, Decreased mobility, Decreased range of motion, Decreased strength, Difficulty walking, Hypomobility, Increased muscle spasms, Pain, Impaired flexibility, Improper body mechanics, Postural dysfunction  Visit Diagnosis: Chronic midline low back pain, with sciatica presence unspecified  Muscle weakness (generalized)  Difficulty in walking, not elsewhere classified  Chronic right-sided low back pain, with sciatica presence unspecified     Problem List Patient Active Problem List   Diagnosis Date Noted  . Deep vein thrombosis (DVT) ([place] 04/08/2015  . May-Thurner syndrome 04/08/2015  . Overdose 02/09/2015  .  Ache in joint 11/21/2013  . Arteriovenous fistula ([place]) 06/20/2013  . Absolute anemia 10/19/2012  . Recurrent major depressive disorder, in partial remission ([place]) 07/13/2012  . Chronic pain 01/22/2012  . Clinical depression 01/22/2012  . Disease of lung 01/22/2012  . Fibromyalgia 01/22/2012  . LBP (low back pain) 01/22/2012  . Cannot sleep 01/22/2012  . Emotional neurotic disorder 01/22/2012  . Cannabis abuse, continuous 01/22/2012  . Pulmonary embolism with infarction ([place]) 01/22/2012  . Esophagitis, reflux 01/22/2012  . Current tobacco use 01/22/2012  . Narcotic drug use 11/02/2010  . Post-phlebitic syndrome 07/05/2010  . Borderline personality disorder ([place]) 06/15/2010  . Chronic deep vein thrombosis (DVT) of lower extremity ([place]) 06/15/2010  . Hereditary and idiopathic neuropathy 06/15/2010  . Neurosis, posttraumatic 06/15/2010   Janna Arch, PT, DPT   06/05/2017, 5:05 PM  [place] MAIN [facility] SERVICES [address] [place], [place],  [address] Phone: [phone]   Fax:  [phone]  Name: Lindsey Willis MRN: [id number] Date of Birth: 21-Sep-1975

## 2017-06-07 ENCOUNTER — Ambulatory Visit: Payer: PPO

## 2017-06-08 DIAGNOSIS — R609 Edema, unspecified: Secondary | ICD-10-CM | POA: Diagnosis not present

## 2017-06-08 DIAGNOSIS — J4540 Moderate persistent asthma, uncomplicated: Secondary | ICD-10-CM | POA: Diagnosis not present

## 2017-06-08 DIAGNOSIS — J301 Allergic rhinitis due to pollen: Secondary | ICD-10-CM | POA: Diagnosis not present

## 2017-06-14 ENCOUNTER — Ambulatory Visit: Payer: PPO | Attending: Psychiatry

## 2017-06-14 ENCOUNTER — Other Ambulatory Visit: Payer: Self-pay

## 2017-06-14 DIAGNOSIS — G8929 Other chronic pain: Secondary | ICD-10-CM | POA: Insufficient documentation

## 2017-06-14 DIAGNOSIS — M545 Low back pain: Secondary | ICD-10-CM | POA: Insufficient documentation

## 2017-06-14 DIAGNOSIS — R262 Difficulty in walking, not elsewhere classified: Secondary | ICD-10-CM | POA: Diagnosis not present

## 2017-06-14 DIAGNOSIS — M6281 Muscle weakness (generalized): Secondary | ICD-10-CM | POA: Diagnosis not present

## 2017-06-14 NOTE — Therapy (Signed)
[place] MAIN [facility] SERVICES [address] [place], [place], [address] Phone: [phone]   Fax:  [phone]  Physical Therapy Treatment  Patient Details  Name: Lindsey Willis MRN: [id number] Date of Birth: [birth year] Referring Provider: Fabio Bering   Encounter Date: 06/14/2017  PT End of Session - 06/14/17 1542    Visit Number  29    Number of Visits  44    Date for PT Re-Evaluation  07/31/17    PT Start Time  1030    PT Stop Time  1120    PT Time Calculation (min)  50 min    Activity Tolerance  Patient tolerated treatment well    Behavior During Therapy  [facility] for tasks assessed/performed       Past Medical History:  Diagnosis Date  . Anemia   . Asthma   . Borderline personality disorder ([place])   . Depression   . DVT of leg (deep venous thrombosis) ([place])   . Fibromyalgia   . May-Thurner syndrome   . PTSD (post-traumatic stress disorder)     Past Surgical History:  Procedure Laterality Date  . BREAST CYST ASPIRATION Right    neg  . CHOLECYSTECTOMY    . IVC FILTER PLACEMENT ([facility] HX)    . LEG SURGERY    . TUBAL LIGATION      There were no vitals filed for this visit.  Subjective Assessment - 06/14/17 1537    Subjective  Pt reports seeing primary care MD last week who placed pt on a 10 day course of Prednisone due to LE swelling, which has begun helping in the last 2 days according to pt. Pt has also been referred to her vascular MD and is awaiting a call to schedule the appointment. Pt notes primary care physician did not restrict her activity/therapy. Swelling is down considerably in BLEs. Pt reports usual mild L thigh pain due to long standing vascular issues. R hip/buttock pain is "just an ache" (suspect due to current Prednisone use)    Pertinent History  Pt has had chronic low back pain for 10+ years.  Pt has hx of anxiety, asthma, chronic LBP, major depressive disorder, fibromyalgia, May-Thurner Syndrome, RLS and  PTSD.  Pt has had a hx of LE DVT's and pulmonary embolism with multiple stene placements and thrombolysis in B LE's with last stent placement occuring in 2012.  Pt is currently on anticoagulants. Pt has had 2 nerve blocks for leg pain in the past resulting in decreased sensation and function of L LE.  Pt also has decreased sensation of B feet.      Enter/exits via steps  Ambulation  Fwd, 4 L  Side, 2 L  Side with minisquat, 2 L  Bench, core with slow LE movement (range versus speed), 5 min ea  Bike  Scissor  Flutter  SKTC small tucks, 3 x 10 each   Core with UE strengthening  Green dumbbells, 3 x 10    Triceps pressdowns   Sh abd/add   Sh horiz abd/add Red dumbbells, 3 x 10    Sh flex/ext  Modified planks, 3 x 1 min ea  Fwd  Side, B   Stretching, 3 x 10 sec ea  Stand at step   ham/gastroc   Hip flexor/quad  Seated at step   Figure 4 and cross body piriformis  PT Education - 06/14/17 1541    Education provided  Yes    Education Details  maintaining easier session regarding LE strengthening/work and avoiding increased heart rate/circulation due to awaiting vascular MD imput.    Person(s) Educated  Patient    Methods  Explanation    Comprehension  Verbalized understanding       PT Short Term Goals - 06/05/17 1630      PT SHORT TERM GOAL #1   Title  Pt will demonstrate improved pain levels with activity to 6/10 or <.    Baseline  Worst: 9/10, Best: 6/10, 8/10 right hip pain 2/20: worst pain 8/10; 326: worst 7/10 4/30: 7/10    Time  4    Period  Weeks    Status  Partially Met    Target Date  07/03/17      PT SHORT TERM GOAL #2   Title  Pt will improve Mod ODI to 60% or less indicating severe disability in order to demonstrate self-reported improvements of pain and mobility.    Baseline  11/15/16: 72% indicating extreme disability, 52 % 2/20: 44%    Time  8    Period  Weeks    Status  Achieved        PT Long Term  Goals - 06/05/17 1630      PT LONG TERM GOAL #1   Title  Pt will demonstrate independence with HEP in order to manage pain and other symptoms.    Baseline  Patient is in aquatic therapy and is doing her exercises outside of the pool    Time  8    Period  Weeks    Status  Partially Met    Target Date  07/31/17      PT LONG TERM GOAL #2   Title  Pt will improve gait speed to at least 1.0 m/s in order to demonstrate improvements in LE strength and decreaed pain levels in order to ambulate within limited community ambulator status.    Baseline  11/15/16: 0.5 m/s, .36 m/sec; .45 sec 01/23/17, .41 m/sec 2/20:  .62ms 3/26: .55 m/s 4/30: .58 m/s     Time  8    Period  Weeks    Status  Partially Met    Target Date  07/31/17      PT LONG TERM GOAL #3   Title  Pt will reduce time to complete 5x sit to stand to <30 seconds in order to demonstrate improved LE strength and decreaed levels of pain, demonstrating improvement in functional mobility.    Baseline  11/15/16: 50.38 sec, 50.54 sec:01/23/17 45.33 sec, 41.66 sec 2/20: 34.8 seconds 3/26: 30 seconds    Time  8    Period  Weeks    Status  Achieved      PT LONG TERM GOAL #4   Title  Pt will improve Mod ODI to 40% or < indicating moderate level of disability in order to demonstrate self-reported improvements in pain and mobility.    Baseline  11/15/16: 72% indicating extreme disability, 52 % ODI; 44% ODI, 48% indicating moderate disability; 2/20: 44% 3/26: 44% 4/30: 38%    Time  8    Period  Weeks    Status  Achieved      PT LONG TERM GOAL #5   Title  Pt will improve LE strength to at least 4/5 in all limited planes in order to improve mobility, gait, and ADL ability.    Baseline  Gross strength  assessment: 3/5 - 2/5 in L hip blex and B hip ext 2/20 : pain limited MMT     Time  8    Period  Weeks    Status  Partially Met    Target Date  07/31/17      Additional Long Term Goals   Additional Long Term Goals  Yes      PT LONG TERM GOAL  #6   Title  Pt will reduce time to complete 5x sit to stand to <20 seconds in order to demonstrate improved LE strength and decreaed levels of pain, demonstrating improvement in functional mobility.    Baseline  3/26: 30 seconds 4/30: 25 seconds    Time  8    Period  Weeks    Status  Partially Met    Target Date  07/31/17      PT LONG TERM GOAL #7   Title  Patient will reduce timed up and go to <11 seconds to reduce fall risk and demonstrate improved transfer/gait ability    Baseline  3/26: 22 seconds 4/30: 20 seconds    Time  8    Period  Weeks    Status  Partially Met    Target Date  07/31/17      PT LONG TERM GOAL #8   Title  Patient will report having "bad pain" day <2x/week demonstrating improved quality of life.     Baseline  4/30: 2x/week    Time  8    Period  Weeks    Status  New    Target Date  07/31/17            Plan - 06/14/17 1543    Clinical Impression Statement  Pt tolerated session well with focus on core strengthening, LE gentle movement and stretching in pool versus warm hot tub due to vascular issue uncertainty. Pt did demonstrate improved modified planks with increased hold times.    Rehab Potential  Fair    Clinical Impairments Affecting Rehab Potential  age, perception of dx, comorbidities    PT Frequency  2x / week    PT Duration  8 weeks    PT Treatment/Interventions  ADLs/Self Care Home Management;Aquatic Therapy;Cryotherapy;Moist Heat;Traction;Ultrasound;Electrical Stimulation;Gait training;Stair training;Functional mobility training;Therapeutic activities;Therapeutic exercise;Balance training;Neuromuscular re-education;Patient/family education;Manual techniques;Passive range of motion;Dry needling;Taping    PT Next Visit Plan  begin aquatic therapy    PT Home Exercise Plan  abdominal bracing in hooklying     Consulted and Agree with Plan of Care  Patient       Patient will benefit from skilled therapeutic intervention in order to improve the  following deficits and impairments:  Abnormal gait, Decreased activity tolerance, Decreased endurance, Decreased mobility, Decreased range of motion, Decreased strength, Difficulty walking, Hypomobility, Increased muscle spasms, Pain, Impaired flexibility, Improper body mechanics, Postural dysfunction  Visit Diagnosis: Chronic midline low back pain, with sciatica presence unspecified  Muscle weakness (generalized)  Difficulty in walking, not elsewhere classified  Chronic right-sided low back pain, with sciatica presence unspecified     Problem List Patient Active Problem List   Diagnosis Date Noted  . Deep vein thrombosis (DVT) ([place]) 04/08/2015  . May-Thurner syndrome 04/08/2015  . Overdose 02/09/2015  . Ache in joint 11/21/2013  . Arteriovenous fistula ([place]) 06/20/2013  . Absolute anemia 10/19/2012  . Recurrent major depressive disorder, in partial remission ([place]) 07/13/2012  . Chronic pain 01/22/2012  . Clinical depression 01/22/2012  . Disease of lung 01/22/2012  . Fibromyalgia 01/22/2012  . LBP (  low back pain) 01/22/2012  . Cannot sleep 01/22/2012  . Emotional neurotic disorder 01/22/2012  . Cannabis abuse, continuous 01/22/2012  . Pulmonary embolism with infarction ([place]) 01/22/2012  . Esophagitis, reflux 01/22/2012  . Current tobacco use 01/22/2012  . Narcotic drug use 11/02/2010  . Post-phlebitic syndrome 07/05/2010  . Borderline personality disorder ([place]) 06/15/2010  . Chronic deep vein thrombosis (DVT) of lower extremity ([place]) 06/15/2010  . Hereditary and idiopathic neuropathy 06/15/2010  . Neurosis, posttraumatic 06/15/2010    Lindsey Willis 06/14/2017, 3:47 PM  [place] MAIN [facility] SERVICES [address] [place], [place], [address] Phone: [phone]   Fax:  [phone]  Name: Lindsey Willis MRN: [id number] Date of Birth: 1975-07-04

## 2017-06-18 DIAGNOSIS — I82220 Acute embolism and thrombosis of inferior vena cava: Secondary | ICD-10-CM | POA: Diagnosis not present

## 2017-06-18 DIAGNOSIS — I87009 Postthrombotic syndrome without complications of unspecified extremity: Secondary | ICD-10-CM | POA: Diagnosis not present

## 2017-06-18 DIAGNOSIS — Z7901 Long term (current) use of anticoagulants: Secondary | ICD-10-CM | POA: Diagnosis not present

## 2017-06-18 DIAGNOSIS — R600 Localized edema: Secondary | ICD-10-CM | POA: Diagnosis not present

## 2017-06-21 ENCOUNTER — Other Ambulatory Visit: Payer: Self-pay

## 2017-06-21 ENCOUNTER — Ambulatory Visit: Payer: PPO

## 2017-06-21 DIAGNOSIS — R262 Difficulty in walking, not elsewhere classified: Secondary | ICD-10-CM

## 2017-06-21 DIAGNOSIS — G8929 Other chronic pain: Secondary | ICD-10-CM

## 2017-06-21 DIAGNOSIS — M6281 Muscle weakness (generalized): Secondary | ICD-10-CM

## 2017-06-21 DIAGNOSIS — M545 Low back pain: Principal | ICD-10-CM

## 2017-06-21 NOTE — Therapy (Signed)
[place] MAIN [facility] SERVICES [address] [place], [place], [address] Phone: [phone]   Fax:  [phone]  Physical Therapy Treatment  Patient Details  Name: Lindsey Willis MRN: [id number] Date of Birth: Apr 24, 1975 Referring Provider: Fabio Bering   Encounter Date: 06/21/2017  PT End of Session - 06/21/17 1406    Visit Number  30    Number of Visits  44    Date for PT Re-Evaluation  07/31/17    PT Start Time  0845    PT Stop Time  0945    PT Time Calculation (min)  60 min    Activity Tolerance  Patient tolerated treatment well       Past Medical History:  Diagnosis Date  . Anemia   . Asthma   . Borderline personality disorder ([place])   . Depression   . DVT of leg (deep venous thrombosis) ([place])   . Fibromyalgia   . May-Thurner syndrome   . PTSD (post-traumatic stress disorder)     Past Surgical History:  Procedure Laterality Date  . BREAST CYST ASPIRATION Right    neg  . CHOLECYSTECTOMY    . IVC FILTER PLACEMENT ([facility] HX)    . LEG SURGERY    . TUBAL LIGATION      There were no vitals filed for this visit.  Subjective Assessment - 06/21/17 1358    Subjective  Pt reports seeing her vascular MD this past Tuesday. Per pt US imaging showed clots in LEs and abdomen to be no better and no worse; stents in place and some new compensatory venous flow. Pt states per MD pedal pulses as well as more proximal pulses all good. Pt states MD informed pt that occasional swelling in 1 or both LEs is possible due to her vascular situation and is not of concern unless associated with other symptoms. Pt states MD did not restrict any activity and pt is to continue with PT as before incident. Pt to be see back with vascular MD in 1 year barring any greater concerns. Pt does not increased R hip pain that began yesterday. Pt unsure why, but with further questioning reveals she has been wearing her sandals more over the past week during nicer  weather versus her tennis shoes with R lift insert. Pain currently 7.5/10    Pertinent History  Pt has had chronic low back pain for 10+ years.  Pt has hx of anxiety, asthma, chronic LBP, major depressive disorder, fibromyalgia, May-Thurner Syndrome, RLS and PTSD.  Pt has had a hx of LE DVT's and pulmonary embolism with multiple stene placements and thrombolysis in B LE's with last stent placement occuring in 2012.  Pt is currently on anticoagulants. Pt has had 2 nerve blocks for leg pain in the past resulting in decreased sensation and function of L LE.  Pt also has decreased sensation of B feet.      Enters/exits via steps    Ambulation   4 L fwd  4 L side  Bench with core (and hip range)  2 min slowly (not tolerating in R hip)  R hip stand stretching, 2 x 30 sec ea  Hamstrings  Hip flexor with trunk elongation  QL (side bend and fwd lean)  Bench core  Bike 5 min (improved)  Straight leg up and outs, B, 2 x 10 ea  Lunges, B, 20x each, focus on abdominal and glut strength  Side  Fwd  Fall to recover, 2 L  each  Fwd  Side   Stretching, B, 3 x 15 sec ea  hamstrings  hip flexor with trunk elongation  QL, side bend and fwd position  Piriformis                         PT Education - 06/21/17 1404    Education provided  Yes    Education Details  QL stretching. Fwd/side lunges. Fall to recover abdominal strengthening fwd/side.     Person(s) Educated  Patient    Methods  Explanation;Demonstration    Comprehension  Verbalized understanding;Returned demonstration;Verbal cues required       PT Short Term Goals - 06/05/17 1630      PT SHORT TERM GOAL #1   Title  Pt will demonstrate improved pain levels with activity to 6/10 or <.    Baseline  Worst: 9/10, Best: 6/10, 8/10 right hip pain 2/20: worst pain 8/10; 326: worst 7/10 4/30: 7/10    Time  4    Period  Weeks    Status  Partially Met    Target Date  07/03/17      PT SHORT TERM GOAL #2   Title  Pt  will improve Mod ODI to 60% or less indicating severe disability in order to demonstrate self-reported improvements of pain and mobility.    Baseline  11/15/16: 72% indicating extreme disability, 52 % 2/20: 44%    Time  8    Period  Weeks    Status  Achieved        PT Long Term Goals - 06/05/17 1630      PT LONG TERM GOAL #1   Title  Pt will demonstrate independence with HEP in order to manage pain and other symptoms.    Baseline  Patient is in aquatic therapy and is doing her exercises outside of the pool    Time  8    Period  Weeks    Status  Partially Met    Target Date  07/31/17      PT LONG TERM GOAL #2   Title  Pt will improve gait speed to at least 1.0 m/s in order to demonstrate improvements in LE strength and decreaed pain levels in order to ambulate within limited community ambulator status.    Baseline  11/15/16: 0.5 m/s, .36 m/sec; .45 sec 01/23/17, .41 m/sec 2/20:  .78ms 3/26: .55 m/s 4/30: .58 m/s     Time  8    Period  Weeks    Status  Partially Met    Target Date  07/31/17      PT LONG TERM GOAL #3   Title  Pt will reduce time to complete 5x sit to stand to <30 seconds in order to demonstrate improved LE strength and decreaed levels of pain, demonstrating improvement in functional mobility.    Baseline  11/15/16: 50.38 sec, 50.54 sec:01/23/17 45.33 sec, 41.66 sec 2/20: 34.8 seconds 3/26: 30 seconds    Time  8    Period  Weeks    Status  Achieved      PT LONG TERM GOAL #4   Title  Pt will improve Mod ODI to 40% or < indicating moderate level of disability in order to demonstrate self-reported improvements in pain and mobility.    Baseline  11/15/16: 72% indicating extreme disability, 52 % ODI; 44% ODI, 48% indicating moderate disability; 2/20: 44% 3/26: 44% 4/30: 38%    Time  8  Period  Weeks    Status  Achieved      PT LONG TERM GOAL #5   Title  Pt will improve LE strength to at least 4/5 in all limited planes in order to improve mobility, gait, and ADL  ability.    Baseline  Gross strength assessment: 3/5 - 2/5 in L hip blex and B hip ext 2/20 : pain limited MMT     Time  8    Period  Weeks    Status  Partially Met    Target Date  07/31/17      Additional Long Term Goals   Additional Long Term Goals  Yes      PT LONG TERM GOAL #6   Title  Pt will reduce time to complete 5x sit to stand to <20 seconds in order to demonstrate improved LE strength and decreaed levels of pain, demonstrating improvement in functional mobility.    Baseline  3/26: 30 seconds 4/30: 25 seconds    Time  8    Period  Weeks    Status  Partially Met    Target Date  07/31/17      PT LONG TERM GOAL #7   Title  Patient will reduce timed up and go to <11 seconds to reduce fall risk and demonstrate improved transfer/gait ability    Baseline  3/26: 22 seconds 4/30: 20 seconds    Time  8    Period  Weeks    Status  Partially Met    Target Date  07/31/17      PT LONG TERM GOAL #8   Title  Patient will report having "bad pain" day <2x/week demonstrating improved quality of life.     Baseline  4/30: 2x/week    Time  8    Period  Weeks    Status  New    Target Date  07/31/17            Plan - 06/21/17 1406    Clinical Impression Statement  Pt felt greater ease and less pain/pulling symptoms in R hip with activity post series of stretches including hamstrings, hip flexor with trunk elongation and QL on R. Pt demonstrated good understanding of new exercises with cues required for lunging technique; good performance of fall to recover abdominal work.     Rehab Potential  Fair    Clinical Impairments Affecting Rehab Potential  age, perception of dx, comorbidities    PT Frequency  2x / week    PT Duration  8 weeks    PT Treatment/Interventions  ADLs/Self Care Home Management;Aquatic Therapy;Cryotherapy;Moist Heat;Traction;Ultrasound;Electrical Stimulation;Gait training;Stair training;Functional mobility training;Therapeutic activities;Therapeutic exercise;Balance  training;Neuromuscular re-education;Patient/family education;Manual techniques;Passive range of motion;Dry needling;Taping    PT Next Visit Plan  begin aquatic therapy    PT Home Exercise Plan  abdominal bracing in hooklying     Consulted and Agree with Plan of Care  Patient       Patient will benefit from skilled therapeutic intervention in order to improve the following deficits and impairments:  Abnormal gait, Decreased activity tolerance, Decreased endurance, Decreased mobility, Decreased range of motion, Decreased strength, Difficulty walking, Hypomobility, Increased muscle spasms, Pain, Impaired flexibility, Improper body mechanics, Postural dysfunction  Visit Diagnosis: Chronic midline low back pain, with sciatica presence unspecified  Muscle weakness (generalized)  Difficulty in walking, not elsewhere classified  Chronic right-sided low back pain, with sciatica presence unspecified     Problem List Patient Active Problem List   Diagnosis Date Noted  .  Deep vein thrombosis (DVT) ([place]) 04/08/2015  . May-Thurner syndrome 04/08/2015  . Overdose 02/09/2015  . Ache in joint 11/21/2013  . Arteriovenous fistula ([place]) 06/20/2013  . Absolute anemia 10/19/2012  . Recurrent major depressive disorder, in partial remission ([place]) 07/13/2012  . Chronic pain 01/22/2012  . Clinical depression 01/22/2012  . Disease of lung 01/22/2012  . Fibromyalgia 01/22/2012  . LBP (low back pain) 01/22/2012  . Cannot sleep 01/22/2012  . Emotional neurotic disorder 01/22/2012  . Cannabis abuse, continuous 01/22/2012  . Pulmonary embolism with infarction ([place]) 01/22/2012  . Esophagitis, reflux 01/22/2012  . Current tobacco use 01/22/2012  . Narcotic drug use 11/02/2010  . Post-phlebitic syndrome 07/05/2010  . Borderline personality disorder ([place]) 06/15/2010  . Chronic deep vein thrombosis (DVT) of lower extremity ([place]) 06/15/2010  . Hereditary and idiopathic neuropathy 06/15/2010  . Neurosis,  posttraumatic 06/15/2010    Larae Grooms 06/21/2017, 2:09 PM  [place] MAIN [facility] SERVICES [address] [place], [place], [address] Phone: [phone]   Fax:  [phone]  Name: Lindsey Willis MRN: [id number] Date of Birth: 1975-05-04

## 2017-06-25 ENCOUNTER — Ambulatory Visit: Payer: PPO | Admitting: Physical Therapy

## 2017-06-25 ENCOUNTER — Encounter: Payer: Self-pay | Admitting: Physical Therapy

## 2017-06-25 DIAGNOSIS — M545 Low back pain: Secondary | ICD-10-CM | POA: Diagnosis not present

## 2017-06-25 DIAGNOSIS — G8929 Other chronic pain: Secondary | ICD-10-CM

## 2017-06-25 DIAGNOSIS — R262 Difficulty in walking, not elsewhere classified: Secondary | ICD-10-CM

## 2017-06-25 DIAGNOSIS — M6281 Muscle weakness (generalized): Secondary | ICD-10-CM

## 2017-06-25 NOTE — Therapy (Signed)
[place] MAIN [facility] SERVICES [place], [place], [address] Phone: [phone]   Fax:  [phone]  Physical Therapy Treatment  Physical Therapy Progress Note   Dates of reporting period   06/05/17  to  06/25/17   Patient Details  Name: Lindsey Willis MRN: [id number] Date of Birth: 1975-09-19 Referring Provider: Fabio Bering   Encounter Date: 06/25/2017  PT End of Session - 06/25/17 1440    Visit Number  31    Number of Visits  65    Date for PT Re-Evaluation  07/31/17    Authorization Type  10/10 progress note    PT Start Time  1440    PT Stop Time  1513    PT Time Calculation (min)  33 min    Activity Tolerance  Patient tolerated treatment well       Past Medical History:  Diagnosis Date  . Anemia   . Asthma   . Borderline personality disorder ([place])   . Depression   . DVT of leg (deep venous thrombosis) ([place])   . Fibromyalgia   . May-Thurner syndrome   . PTSD (post-traumatic stress disorder)     Past Surgical History:  Procedure Laterality Date  . BREAST CYST ASPIRATION Right    neg  . CHOLECYSTECTOMY    . IVC FILTER PLACEMENT ([facility] HX)    . LEG SURGERY    . TUBAL LIGATION      There were no vitals filed for this visit.  Subjective Assessment - 06/25/17 1442    Subjective  Pt reports generalized pain of 7/10 throughout body but 6/10 pain specific to her R hip.  Pt believes she is improving with therapy and says she can walk farther since starting therapy, pt reports her R hip does not ache as much as it used to and she does not have to shift as frequently when sitting.     Pertinent History  Pt has had chronic low back pain for 10+ years.  Pt has hx of anxiety, asthma, chronic LBP, major depressive disorder, fibromyalgia, May-Thurner Syndrome, RLS and PTSD.  Pt has had a hx of LE DVT's and pulmonary embolism with multiple stene placements and thrombolysis in B LE's with last stent placement occuring  in 2012.  Pt is currently on anticoagulants. Pt has had 2 nerve blocks for leg pain in the past resulting in decreased sensation and function of L LE.  Pt also has decreased sensation of B feet.    Currently in Pain?  Yes    Pain Score  6     Pain Location  Hip    Pain Orientation  Right    Pain Descriptors / Indicators  Aching    Pain Type  Chronic pain       TREATMENT  5xSTS: 17.34 seconds  43mT: 0.43 m/s  TUG: 15.33 seconds  Assessment of BLE strength: L hip F 2/5, hip abd 4/5 on R hip abd 4-/5 on L, R hip add 4/5 on R and 4-/5 on the L  Seated marching x10 each LE with fatigue at end of set  LAQ with 5 second holds x10 each LE with fatigue noted at end of set  Seated Bil hip Abd/ER isometrics with band around knees x10 second holds x10  Seated Bil hip adduction pillow squeezes x10 second holds x10             Patient's condition has the potential to improve in response  to therapy. Maximum improvement is yet to be obtained. The anticipated improvement is attainable and reasonable in a generally predictable time.             PT Education - 06/25/17 1440    Education provided  Yes    Education Details  Exercise technique; results of outcome measures    Person(s) Educated  Patient    Methods  Explanation;Demonstration;Verbal cues    Comprehension  Verbalized understanding;Returned demonstration;Verbal cues required;Need further instruction       PT Short Term Goals - 06/05/17 1630      PT SHORT TERM GOAL #1   Title  Pt will demonstrate improved pain levels with activity to 6/10 or <.    Baseline  Worst: 9/10, Best: 6/10, 8/10 right hip pain 2/20: worst pain 8/10; 326: worst 7/10 4/30: 7/10    Time  4    Period  Weeks    Status  Partially Met    Target Date  07/03/17      PT SHORT TERM GOAL #2   Title  Pt will improve Mod ODI to 60% or less indicating severe disability in order to demonstrate self-reported improvements of pain and mobility.     Baseline  11/15/16: 72% indicating extreme disability, 52 % 2/20: 44%    Time  8    Period  Weeks    Status  Achieved        PT Long Term Goals - 06/25/17 1445      PT LONG TERM GOAL #1   Title  Pt will demonstrate independence with HEP in order to manage pain and other symptoms.    Baseline  Patient is in aquatic therapy and is doing her exercises outside of the pool; 5/20: pt reports she is completing her land exercises every day    Time  8    Period  Weeks    Status  Achieved      PT LONG TERM GOAL #2   Title  Pt will improve gait speed to at least 1.0 m/s in order to demonstrate improvements in LE strength and decreaed pain levels in order to ambulate within limited community ambulator status.    Baseline  11/15/16: 0.5 m/s, .36 m/sec; .45 sec 01/23/17, .41 m/sec 2/20:  .4ms 3/26: .55 m/s 4/30: .58 m/s; 5/20: .43 m/s    Time  8    Period  Weeks    Status  Partially Met      PT LONG TERM GOAL #3   Title  Pt will reduce time to complete 5x sit to stand to <30 seconds in order to demonstrate improved LE strength and decreaed levels of pain, demonstrating improvement in functional mobility.    Baseline  11/15/16: 50.38 sec, 50.54 sec:01/23/17 45.33 sec, 41.66 sec 2/20: 34.8 seconds 3/26: 30 seconds; 5/20: 17.34 seconds    Time  8    Period  Weeks    Status  Achieved      PT LONG TERM GOAL #4   Title  Pt will improve Mod ODI to 40% or < indicating moderate level of disability in order to demonstrate self-reported improvements in pain and mobility.    Baseline  11/15/16: 72% indicating extreme disability, 52 % ODI; 44% ODI, 48% indicating moderate disability; 2/20: 44% 3/26: 44% 4/30: 38%    Time  8    Period  Weeks    Status  Achieved      PT LONG TERM GOAL #5   Title  Pt will improve LE strength to at least 4/5 in all limited planes in order to improve mobility, gait, and ADL ability.    Baseline  Gross strength assessment: 3/5 - 2/5 in L hip blex and B hip ext 2/20 : pain  limited MMT; 5/20: L hip F 2/5, hip abd 4/5 on R hip abd 4-/5 on L, R hip add 4/5 on R and 4-/5 on the L    Time  8    Period  Weeks    Status  Partially Met      PT LONG TERM GOAL #6   Title  Pt will reduce time to complete 5x sit to stand to <20 seconds in order to demonstrate improved LE strength and decreaed levels of pain, demonstrating improvement in functional mobility.    Baseline  3/26: 30 seconds 4/30: 25 seconds; 5/20: 17.34 seconds    Time  8    Period  Weeks    Status  Partially Met      PT LONG TERM GOAL #7   Title  Patient will reduce timed up and go to <11 seconds to reduce fall risk and demonstrate improved transfer/gait ability    Baseline  3/26: 22 seconds 4/30: 20 seconds; 5/20: 15.33 seconds    Time  8    Period  Weeks    Status  Partially Met      PT LONG TERM GOAL #8   Title  Patient will report having "bad pain" day <2x/week demonstrating improved quality of life.     Baseline  4/30: 2x/week; 5/20: pt reports having 2x/wk "bad days"    Time  8    Period  Weeks    Status  On-going            Plan - 06/25/17 1441    Clinical Impression Statement  Pt has met her goal of completing her HEP consistently and has been completing her land HEP each day.  Pt's 67mT did not show improvement this session so will focus efforts on ambulatory speed and safety moving forward. Pt's 5xSTS improved significantly from 30 seconds last testing to 17.34 seconds this session.  Pt reports having bad days "but not as many", having 2 bad days each week.  Pt's TUG improved from 20 seconds to 15.33 seconds, demonstrating improved balance and BLE strength.  Pt demonstrates fatigue with strengthening exercises this session and will benefit from continued aquatic therapy to be able challenge herself with decreased pain compared to land therapy. Pt will benefit from continued skilled PT interventions for improved strength, decreased pain, and improved QOL.      Rehab Potential  Fair     Clinical Impairments Affecting Rehab Potential  age, perception of dx, comorbidities    PT Frequency  2x / week    PT Duration  8 weeks    PT Treatment/Interventions  ADLs/Self Care Home Management;Aquatic Therapy;Cryotherapy;Moist Heat;Traction;Ultrasound;Electrical Stimulation;Gait training;Stair training;Functional mobility training;Therapeutic activities;Therapeutic exercise;Balance training;Neuromuscular re-education;Patient/family education;Manual techniques;Passive range of motion;Dry needling;Taping    PT Next Visit Plan  continue aquatic therapy    PT Home Exercise Plan  abdominal bracing in hooklying     Consulted and Agree with Plan of Care  Patient       Patient will benefit from skilled therapeutic intervention in order to improve the following deficits and impairments:  Abnormal gait, Decreased activity tolerance, Decreased endurance, Decreased mobility, Decreased range of motion, Decreased strength, Difficulty walking, Hypomobility, Increased muscle spasms, Pain, Impaired flexibility, Improper body mechanics,  Postural dysfunction  Visit Diagnosis: Chronic midline low back pain, with sciatica presence unspecified  Muscle weakness (generalized)  Difficulty in walking, not elsewhere classified  Chronic right-sided low back pain, with sciatica presence unspecified     Problem List Patient Active Problem List   Diagnosis Date Noted  . Deep vein thrombosis (DVT) ([place]) 04/08/2015  . May-Thurner syndrome 04/08/2015  . Overdose 02/09/2015  . Ache in joint 11/21/2013  . Arteriovenous fistula ([place]) 06/20/2013  . Absolute anemia 10/19/2012  . Recurrent major depressive disorder, in partial remission ([place]) 07/13/2012  . Chronic pain 01/22/2012  . Clinical depression 01/22/2012  . Disease of lung 01/22/2012  . Fibromyalgia 01/22/2012  . LBP (low back pain) 01/22/2012  . Cannot sleep 01/22/2012  . Emotional neurotic disorder 01/22/2012  . Cannabis abuse, continuous 01/22/2012   . Pulmonary embolism with infarction ([place]) 01/22/2012  . Esophagitis, reflux 01/22/2012  . Current tobacco use 01/22/2012  . Narcotic drug use 11/02/2010  . Post-phlebitic syndrome 07/05/2010  . Borderline personality disorder ([place]) 06/15/2010  . Chronic deep vein thrombosis (DVT) of lower extremity ([place]) 06/15/2010  . Hereditary and idiopathic neuropathy 06/15/2010  . Neurosis, posttraumatic 06/15/2010    Collie Siad PT, DPT 06/25/2017, 3:11 PM  [place] MAIN [facility] SERVICES [address] [place], [place], [address] Phone: [phone]   Fax:  [phone]  Name: Lindsey Willis MRN: [id number] Date of Birth: 04/28/1975

## 2017-06-28 ENCOUNTER — Ambulatory Visit: Payer: PPO

## 2017-06-28 ENCOUNTER — Other Ambulatory Visit: Payer: Self-pay

## 2017-06-28 DIAGNOSIS — G8929 Other chronic pain: Secondary | ICD-10-CM

## 2017-06-28 DIAGNOSIS — M6281 Muscle weakness (generalized): Secondary | ICD-10-CM

## 2017-06-28 DIAGNOSIS — M545 Low back pain: Secondary | ICD-10-CM | POA: Diagnosis not present

## 2017-06-28 DIAGNOSIS — R262 Difficulty in walking, not elsewhere classified: Secondary | ICD-10-CM

## 2017-06-28 NOTE — Therapy (Signed)
[facility] SERVICES [address] [place], [place], [address] Phone: [phone]   Fax:  [phone]  Physical Therapy Treatment  Patient Details  Name: Lindsey Willis MRN: [id number] Date of Birth: 05/31/1975 Referring Provider: Fabio Bering   Encounter Date: 06/28/2017  PT End of Session - 06/28/17 1659    Visit Number  32    Number of Visits  44    Date for PT Re-Evaluation  07/31/17    PT Start Time  0845    PT Stop Time  0945    PT Time Calculation (min)  60 min    Activity Tolerance  Patient tolerated treatment well    Behavior During Therapy  [facility] for tasks assessed/performed       Past Medical History:  Diagnosis Date  . Anemia   . Asthma   . Borderline personality disorder ([place])   . Depression   . DVT of leg (deep venous thrombosis) ([place])   . Fibromyalgia   . May-Thurner syndrome   . PTSD (post-traumatic stress disorder)     Past Surgical History:  Procedure Laterality Date  . BREAST CYST ASPIRATION Right    neg  . CHOLECYSTECTOMY    . IVC FILTER PLACEMENT ([facility] HX)    . LEG SURGERY    . TUBAL LIGATION      There were no vitals filed for this visit.  Subjective Assessment - 06/28/17 1658    Subjective  Pt reports feeling stiff today due to rain; otherwise, no voiced complaints.     Pertinent History  Pt has had chronic low back pain for 10+ years.  Pt has hx of anxiety, asthma, chronic LBP, major depressive disorder, fibromyalgia, May-Thurner Syndrome, RLS and PTSD.  Pt has had a hx of LE DVT's and pulmonary embolism with multiple stene placements and thrombolysis in B LE's with last stent placement occuring in 2012.  Pt is currently on anticoagulants. Pt has had 2 nerve blocks for leg pain in the past resulting in decreased sensation and function of L LE.  Pt also has decreased sensation of B feet.      Enters/exits via steps   Ambulation  4 L F  2 L sidestep  2 L sidestep with  minisquat  Suspended work, 1 min each (total 4 min)  Jog   Barnabas Lister  Ski  X jack  1 L active recovery walk   Core with UE work, green dumbbells, 2 x 10 each  Triceps press down  Sh abd/add  Sh flex/ext  Sh horiz abd/add  Stand core flexion with ball overhead. Knee to ball, 2 x 10, B  Suspended work as above, 4 min  1 L active recovery walk  Fall to recover, fwd, R, L  2L each  Stretching, 3 x 15 sec B  Stand hip flexor with overhead reach   Hamstrings  Additional stretching independently in hot tub x 10 min (no charge)                         PT Education - 06/28/17 1658    Education provided  Yes    Education Details  Re instructed in suspended work, progressed core activity.     Person(s) Educated  Patient    Methods  Explanation;Demonstration    Comprehension  Verbalized understanding;Returned demonstration       PT Short Term Goals - 06/05/17 1630      PT  SHORT TERM GOAL #1   Title  Pt will demonstrate improved pain levels with activity to 6/10 or <.    Baseline  Worst: 9/10, Best: 6/10, 8/10 right hip pain 2/20: worst pain 8/10; 326: worst 7/10 4/30: 7/10    Time  4    Period  Weeks    Status  Partially Met    Target Date  07/03/17      PT SHORT TERM GOAL #2   Title  Pt will improve Mod ODI to 60% or less indicating severe disability in order to demonstrate self-reported improvements of pain and mobility.    Baseline  11/15/16: 72% indicating extreme disability, 52 % 2/20: 44%    Time  8    Period  Weeks    Status  Achieved        PT Long Term Goals - 06/25/17 1445      PT LONG TERM GOAL #1   Title  Pt will demonstrate independence with HEP in order to manage pain and other symptoms.    Baseline  Patient is in aquatic therapy and is doing her exercises outside of the pool; 5/20: pt reports she is completing her land exercises every day    Time  8    Period  Weeks    Status  Achieved      PT LONG TERM GOAL #2   Title  Pt  will improve gait speed to at least 1.0 m/s in order to demonstrate improvements in LE strength and decreaed pain levels in order to ambulate within limited community ambulator status.    Baseline  11/15/16: 0.5 m/s, .36 m/sec; .45 sec 01/23/17, .41 m/sec 2/20:  .88ms 3/26: .55 m/s 4/30: .58 m/s; 5/20: .43 m/s    Time  8    Period  Weeks    Status  Partially Met      PT LONG TERM GOAL #3   Title  Pt will reduce time to complete 5x sit to stand to <30 seconds in order to demonstrate improved LE strength and decreaed levels of pain, demonstrating improvement in functional mobility.    Baseline  11/15/16: 50.38 sec, 50.54 sec:01/23/17 45.33 sec, 41.66 sec 2/20: 34.8 seconds 3/26: 30 seconds; 5/20: 17.34 seconds    Time  8    Period  Weeks    Status  Achieved      PT LONG TERM GOAL #4   Title  Pt will improve Mod ODI to 40% or < indicating moderate level of disability in order to demonstrate self-reported improvements in pain and mobility.    Baseline  11/15/16: 72% indicating extreme disability, 52 % ODI; 44% ODI, 48% indicating moderate disability; 2/20: 44% 3/26: 44% 4/30: 38%    Time  8    Period  Weeks    Status  Achieved      PT LONG TERM GOAL #5   Title  Pt will improve LE strength to at least 4/5 in all limited planes in order to improve mobility, gait, and ADL ability.    Baseline  Gross strength assessment: 3/5 - 2/5 in L hip blex and B hip ext 2/20 : pain limited MMT; 5/20: L hip F 2/5, hip abd 4/5 on R hip abd 4-/5 on L, R hip add 4/5 on R and 4-/5 on the L    Time  8    Period  Weeks    Status  Partially Met      PT LONG TERM GOAL #6  Title  Pt will reduce time to complete 5x sit to stand to <20 seconds in order to demonstrate improved LE strength and decreaed levels of pain, demonstrating improvement in functional mobility.    Baseline  3/26: 30 seconds 4/30: 25 seconds; 5/20: 17.34 seconds    Time  8    Period  Weeks    Status  Partially Met      PT LONG TERM GOAL #7    Title  Patient will reduce timed up and go to <11 seconds to reduce fall risk and demonstrate improved transfer/gait ability    Baseline  3/26: 22 seconds 4/30: 20 seconds; 5/20: 15.33 seconds    Time  8    Period  Weeks    Status  Partially Met      PT LONG TERM GOAL #8   Title  Patient will report having "bad pain" day <2x/week demonstrating improved quality of life.     Baseline  4/30: 2x/week; 5/20: pt reports having 2x/wk "bad days"    Time  8    Period  Weeks    Status  On-going            Plan - 06/28/17 1659    Clinical Impression Statement  Pt demonstrated good ability to progress today tolerating increased work with multiple muscle group involvement as well as short bursts of increased speed/intensity work. Progressed core work to standing activity without support. Contiue progressing activity/strength and speed work for increased functional ability and tolerance of activities.     Rehab Potential  Fair    Clinical Impairments Affecting Rehab Potential  age, perception of dx, comorbidities    PT Frequency  2x / week    PT Duration  8 weeks    PT Treatment/Interventions  ADLs/Self Care Home Management;Aquatic Therapy;Cryotherapy;Moist Heat;Traction;Ultrasound;Electrical Stimulation;Gait training;Stair training;Functional mobility training;Therapeutic activities;Therapeutic exercise;Balance training;Neuromuscular re-education;Patient/family education;Manual techniques;Passive range of motion;Dry needling;Taping    PT Next Visit Plan  continue aquatic therapy    PT Home Exercise Plan  abdominal bracing in hooklying     Consulted and Agree with Plan of Care  Patient       Patient will benefit from skilled therapeutic intervention in order to improve the following deficits and impairments:  Abnormal gait, Decreased activity tolerance, Decreased endurance, Decreased mobility, Decreased range of motion, Decreased strength, Difficulty walking, Hypomobility, Increased muscle spasms,  Pain, Impaired flexibility, Improper body mechanics, Postural dysfunction  Visit Diagnosis: Chronic midline low back pain, with sciatica presence unspecified  Muscle weakness (generalized)  Difficulty in walking, not elsewhere classified  Chronic right-sided low back pain, with sciatica presence unspecified     Problem List Patient Active Problem List   Diagnosis Date Noted  . Deep vein thrombosis (DVT) ([place]) 04/08/2015  . May-Thurner syndrome 04/08/2015  . Overdose 02/09/2015  . Ache in joint 11/21/2013  . Arteriovenous fistula ([place]) 06/20/2013  . Absolute anemia 10/19/2012  . Recurrent major depressive disorder, in partial remission ([place]) 07/13/2012  . Chronic pain 01/22/2012  . Clinical depression 01/22/2012  . Disease of lung 01/22/2012  . Fibromyalgia 01/22/2012  . LBP (low back pain) 01/22/2012  . Cannot sleep 01/22/2012  . Emotional neurotic disorder 01/22/2012  . Cannabis abuse, continuous 01/22/2012  . Pulmonary embolism with infarction ([place]) 01/22/2012  . Esophagitis, reflux 01/22/2012  . Current tobacco use 01/22/2012  . Narcotic drug use 11/02/2010  . Post-phlebitic syndrome 07/05/2010  . Borderline personality disorder ([place]) 06/15/2010  . Chronic deep vein thrombosis (DVT) of lower extremity ([facility])  06/15/2010  . Hereditary and idiopathic neuropathy 06/15/2010  . Neurosis, posttraumatic 06/15/2010    Larae Grooms 06/28/2017, 5:03 PM  [place] [facility] SERVICES [address] [place], [place], [address] Phone: [phone]   Fax:  [phone]  Name: Lindsey Willis MRN: [id number] Date of Birth: Jun 17, 1975

## 2017-07-03 ENCOUNTER — Ambulatory Visit: Payer: PPO

## 2017-07-03 DIAGNOSIS — S8991XA Unspecified injury of right lower leg, initial encounter: Secondary | ICD-10-CM | POA: Diagnosis not present

## 2017-07-03 DIAGNOSIS — M25561 Pain in right knee: Secondary | ICD-10-CM | POA: Diagnosis not present

## 2017-07-03 DIAGNOSIS — G8929 Other chronic pain: Secondary | ICD-10-CM | POA: Diagnosis not present

## 2017-07-04 ENCOUNTER — Other Ambulatory Visit: Payer: Self-pay | Admitting: Sports Medicine

## 2017-07-04 DIAGNOSIS — M25561 Pain in right knee: Secondary | ICD-10-CM

## 2017-07-12 ENCOUNTER — Ambulatory Visit: Payer: PPO | Attending: Psychiatry

## 2017-07-12 ENCOUNTER — Other Ambulatory Visit: Payer: Self-pay

## 2017-07-12 DIAGNOSIS — M545 Low back pain: Secondary | ICD-10-CM | POA: Insufficient documentation

## 2017-07-12 DIAGNOSIS — M6281 Muscle weakness (generalized): Secondary | ICD-10-CM | POA: Insufficient documentation

## 2017-07-12 DIAGNOSIS — G8929 Other chronic pain: Secondary | ICD-10-CM | POA: Insufficient documentation

## 2017-07-12 DIAGNOSIS — R262 Difficulty in walking, not elsewhere classified: Secondary | ICD-10-CM | POA: Insufficient documentation

## 2017-07-12 NOTE — Therapy (Signed)
[place] MAIN [facility] SERVICES [address] [place], [place], [address] Phone: [phone]   Fax:  [phone]  Physical Therapy Treatment  Patient Details  Name: Lindsey Willis MRN: [id number] Date of Birth: 1975-08-01 Referring Provider: Fabio Bering   Encounter Date: 07/12/2017  PT End of Session - 07/12/17 1719    Visit Number  33    Number of Visits  44    Date for PT Re-Evaluation  07/31/17    PT Start Time  5361    PT Stop Time  1300    PT Time Calculation (min)  45 min    Activity Tolerance  Patient tolerated treatment well;Other (comment)    Behavior During Therapy  WFL for tasks assessed/performed       Past Medical History:  Diagnosis Date  . Anemia   . Asthma   . Borderline personality disorder ([place])   . Depression   . DVT of leg (deep venous thrombosis) ([place])   . Fibromyalgia   . May-Thurner syndrome   . PTSD (post-traumatic stress disorder)     Past Surgical History:  Procedure Laterality Date  . BREAST CYST ASPIRATION Right    neg  . CHOLECYSTECTOMY    . IVC FILTER PLACEMENT ([facility] HX)    . LEG SURGERY    . TUBAL LIGATION      There were no vitals filed for this visit.  Subjective Assessment - 07/12/17 1713    Subjective  Pt with noted RLE limp greater than baseline today. Pt notes hurting R knee while getting out of drivers side care over 1 week ago. Pt has seen orthopedic MD with x rays taken (-). Pt scheduled for an MRI this Saturday, 07/14/2017. Pt states R knee hurts below knee cap and points specifically to Quad tendon insertion as well as small arc under knee cap. Mild redness (not streaking) and swelling. Pt reports MD allowable to continue PT, but avoid extreme bending.     Pertinent History  Pt has had chronic low back pain for 10+ years.  Pt has hx of anxiety, asthma, chronic LBP, major depressive disorder, fibromyalgia, May-Thurner Syndrome, RLS and PTSD.  Pt has had a hx of LE DVT's and  pulmonary embolism with multiple stene placements and thrombolysis in B LE's with last stent placement occuring in 2012.  Pt is currently on anticoagulants. Pt has had 2 nerve blocks for leg pain in the past resulting in decreased sensation and function of L LE.  Pt also has decreased sensation of B feet.      Enters/exits via ramp  Ambulation, blue dumbbells  6 L fwd  4 L side  Stand tall position (4'6" water) core with UE   Green dumbbells, 3 x 10 ea   Triceps press down   Sh abd/add   Sh flex/ext   Sh horiz abd/add  Stand abdominal crunches  Blue dumbbell (1), 30x each   Lateral, B   Fwd  Bench core  2# L up and outs, straight leg, 3 x 10   High plank at bench  2# L, 0 R  Hip ext, 3 x 10 ea  Side high plank, L  2 x 1 min                            PT Education - 07/12/17 1716    Education provided  Yes    Education Details  Oblique cruches  and fwd crunch with resist in stand. High plank progressed to bench with leg extension.     Person(s) Educated  Patient    Methods  Explanation;Demonstration    Comprehension  Verbalized understanding;Returned demonstration;Verbal cues required       PT Short Term Goals - 06/05/17 1630      PT SHORT TERM GOAL #1   Title  Pt will demonstrate improved pain levels with activity to 6/10 or <.    Baseline  Worst: 9/10, Best: 6/10, 8/10 right hip pain 2/20: worst pain 8/10; 326: worst 7/10 4/30: 7/10    Time  4    Period  Weeks    Status  Partially Met    Target Date  07/03/17      PT SHORT TERM GOAL #2   Title  Pt will improve Mod ODI to 60% or less indicating severe disability in order to demonstrate self-reported improvements of pain and mobility.    Baseline  11/15/16: 72% indicating extreme disability, 52 % 2/20: 44%    Time  8    Period  Weeks    Status  Achieved        PT Long Term Goals - 06/25/17 1445      PT LONG TERM GOAL #1   Title  Pt will demonstrate independence with HEP in order  to manage pain and other symptoms.    Baseline  Patient is in aquatic therapy and is doing her exercises outside of the pool; 5/20: pt reports she is completing her land exercises every day    Time  8    Period  Weeks    Status  Achieved      PT LONG TERM GOAL #2   Title  Pt will improve gait speed to at least 1.0 m/s in order to demonstrate improvements in LE strength and decreaed pain levels in order to ambulate within limited community ambulator status.    Baseline  11/15/16: 0.5 m/s, .36 m/sec; .45 sec 01/23/17, .41 m/sec 2/20:  .69ms 3/26: .55 m/s 4/30: .58 m/s; 5/20: .43 m/s    Time  8    Period  Weeks    Status  Partially Met      PT LONG TERM GOAL #3   Title  Pt will reduce time to complete 5x sit to stand to <30 seconds in order to demonstrate improved LE strength and decreaed levels of pain, demonstrating improvement in functional mobility.    Baseline  11/15/16: 50.38 sec, 50.54 sec:01/23/17 45.33 sec, 41.66 sec 2/20: 34.8 seconds 3/26: 30 seconds; 5/20: 17.34 seconds    Time  8    Period  Weeks    Status  Achieved      PT LONG TERM GOAL #4   Title  Pt will improve Mod ODI to 40% or < indicating moderate level of disability in order to demonstrate self-reported improvements in pain and mobility.    Baseline  11/15/16: 72% indicating extreme disability, 52 % ODI; 44% ODI, 48% indicating moderate disability; 2/20: 44% 3/26: 44% 4/30: 38%    Time  8    Period  Weeks    Status  Achieved      PT LONG TERM GOAL #5   Title  Pt will improve LE strength to at least 4/5 in all limited planes in order to improve mobility, gait, and ADL ability.    Baseline  Gross strength assessment: 3/5 - 2/5 in L hip blex and B hip ext 2/20 : pain  limited MMT; 5/20: L hip F 2/5, hip abd 4/5 on R hip abd 4-/5 on L, R hip add 4/5 on R and 4-/5 on the L    Time  8    Period  Weeks    Status  Partially Met      PT LONG TERM GOAL #6   Title  Pt will reduce time to complete 5x sit to stand to <20  seconds in order to demonstrate improved LE strength and decreaed levels of pain, demonstrating improvement in functional mobility.    Baseline  3/26: 30 seconds 4/30: 25 seconds; 5/20: 17.34 seconds    Time  8    Period  Weeks    Status  Partially Met      PT LONG TERM GOAL #7   Title  Patient will reduce timed up and go to <11 seconds to reduce fall risk and demonstrate improved transfer/gait ability    Baseline  3/26: 22 seconds 4/30: 20 seconds; 5/20: 15.33 seconds    Time  8    Period  Weeks    Status  Partially Met      PT LONG TERM GOAL #8   Title  Patient will report having "bad pain" day <2x/week demonstrating improved quality of life.     Baseline  4/30: 2x/week; 5/20: pt reports having 2x/wk "bad days"    Time  8    Period  Weeks    Status  On-going            Plan - 07/12/17 1719    Clinical Impression Statement  Pt performed session well with modifications to protect R knee. Pt denies any increased pain in R knee and able to ambulate in the water with decreased discomfort. Progress as appropriate depending on R knee MRI findings. Deferred stretching today and independent use of hot tub.      Rehab Potential  Fair    Clinical Impairments Affecting Rehab Potential  age, perception of dx, comorbidities    PT Frequency  2x / week    PT Duration  8 weeks    PT Treatment/Interventions  ADLs/Self Care Home Management;Aquatic Therapy;Cryotherapy;Moist Heat;Traction;Ultrasound;Electrical Stimulation;Gait training;Stair training;Functional mobility training;Therapeutic activities;Therapeutic exercise;Balance training;Neuromuscular re-education;Patient/family education;Manual techniques;Passive range of motion;Dry needling;Taping    PT Next Visit Plan  continue aquatic therapy    PT Home Exercise Plan  abdominal bracing in hooklying     Consulted and Agree with Plan of Care  Patient       Patient will benefit from skilled therapeutic intervention in order to improve the  following deficits and impairments:  Abnormal gait, Decreased activity tolerance, Decreased endurance, Decreased mobility, Decreased range of motion, Decreased strength, Difficulty walking, Hypomobility, Increased muscle spasms, Pain, Impaired flexibility, Improper body mechanics, Postural dysfunction  Visit Diagnosis: Chronic midline low back pain, with sciatica presence unspecified  Muscle weakness (generalized)  Difficulty in walking, not elsewhere classified  Chronic right-sided low back pain, with sciatica presence unspecified     Problem List Patient Active Problem List   Diagnosis Date Noted  . Deep vein thrombosis (DVT) ([place]) 04/08/2015  . May-Thurner syndrome 04/08/2015  . Overdose 02/09/2015  . Ache in joint 11/21/2013  . Arteriovenous fistula ([place]) 06/20/2013  . Absolute anemia 10/19/2012  . Recurrent major depressive disorder, in partial remission ([place]) 07/13/2012  . Chronic pain 01/22/2012  . Clinical depression 01/22/2012  . Disease of lung 01/22/2012  . Fibromyalgia 01/22/2012  . LBP (low back pain) 01/22/2012  . Cannot sleep 01/22/2012  .  Emotional neurotic disorder 01/22/2012  . Cannabis abuse, continuous 01/22/2012  . Pulmonary embolism with infarction ([place]) 01/22/2012  . Esophagitis, reflux 01/22/2012  . Current tobacco use 01/22/2012  . Narcotic drug use 11/02/2010  . Post-phlebitic syndrome 07/05/2010  . Borderline personality disorder ([place]) 06/15/2010  . Chronic deep vein thrombosis (DVT) of lower extremity ([place]) 06/15/2010  . Hereditary and idiopathic neuropathy 06/15/2010  . Neurosis, posttraumatic 06/15/2010    Lindsey Willis 07/12/2017, 5:22 PM  [place] MAIN [facility] SERVICES [address] [place], [place], [address] Phone: [phone]   Fax:  [phone]  Name: Lindsey Willis MRN: [id number] Date of Birth: [birth year]

## 2017-07-14 DIAGNOSIS — S99921A Unspecified injury of right foot, initial encounter: Secondary | ICD-10-CM | POA: Diagnosis not present

## 2017-07-14 DIAGNOSIS — M948X6 Other specified disorders of cartilage, lower leg: Secondary | ICD-10-CM | POA: Diagnosis not present

## 2017-07-14 DIAGNOSIS — S99911A Unspecified injury of right ankle, initial encounter: Secondary | ICD-10-CM | POA: Diagnosis not present

## 2017-07-14 DIAGNOSIS — S8991XA Unspecified injury of right lower leg, initial encounter: Secondary | ICD-10-CM | POA: Diagnosis not present

## 2017-07-14 DIAGNOSIS — M25561 Pain in right knee: Secondary | ICD-10-CM | POA: Diagnosis not present

## 2017-07-14 DIAGNOSIS — S8391XA Sprain of unspecified site of right knee, initial encounter: Secondary | ICD-10-CM | POA: Diagnosis not present

## 2017-07-16 ENCOUNTER — Ambulatory Visit: Admission: RE | Admit: 2017-07-16 | Payer: PPO | Source: Ambulatory Visit

## 2017-07-19 ENCOUNTER — Other Ambulatory Visit: Payer: Self-pay

## 2017-07-19 ENCOUNTER — Ambulatory Visit: Payer: PPO

## 2017-07-19 DIAGNOSIS — G8929 Other chronic pain: Secondary | ICD-10-CM

## 2017-07-19 DIAGNOSIS — R262 Difficulty in walking, not elsewhere classified: Secondary | ICD-10-CM

## 2017-07-19 DIAGNOSIS — M545 Low back pain: Secondary | ICD-10-CM | POA: Diagnosis not present

## 2017-07-19 DIAGNOSIS — M6281 Muscle weakness (generalized): Secondary | ICD-10-CM

## 2017-07-19 NOTE — Therapy (Signed)
[place] MAIN [facility] SERVICES [address] [place], [place], [address] Phone: [phone]   Fax:  [phone]  Physical Therapy Treatment  Patient Details  Name: Lindsey Willis MRN: [id number] Date of Birth: Feb 18, 1975 Referring Provider: Fabio Bering   Encounter Date: 07/19/2017  PT End of Session - 07/19/17 1601    Visit Number  34    Number of Visits  44    Date for PT Re-Evaluation  07/31/17    PT Start Time  1205    PT Stop Time  6060    PT Time Calculation (min)  60 min    Activity Tolerance  Patient tolerated treatment well;Other (comment)    Behavior During Therapy  WFL for tasks assessed/performed       Past Medical History:  Diagnosis Date  . Anemia   . Asthma   . Borderline personality disorder ([place])   . Depression   . DVT of leg (deep venous thrombosis) ([place])   . Fibromyalgia   . May-Thurner syndrome   . PTSD (post-traumatic stress disorder)     Past Surgical History:  Procedure Laterality Date  . BREAST CYST ASPIRATION Right    neg  . CHOLECYSTECTOMY    . IVC FILTER PLACEMENT ([facility] HX)    . LEG SURGERY    . TUBAL LIGATION      There were no vitals filed for this visit.  Subjective Assessment - 07/19/17 1556    Subjective  Pt reports MRI from 07/14/17 of R knee revealed torn cartilage behind kneecap. Pt states she will be getting a "steroid" shot this week. Pt denies MD placing pt on any restrictions, but notes significant bending of R knee is painful. Pt is noted to be walking at baseline again and notes pain/swelling in R knee has decreased significantly, as long as it is not aggravated by increased flexion or twisting on knee.     Pertinent History  Pt has had chronic low back pain for 10+ years.  Pt has hx of anxiety, asthma, chronic LBP, major depressive disorder, fibromyalgia, May-Thurner Syndrome, RLS and PTSD.  Pt has had a hx of LE DVT's and pulmonary embolism with multiple stene placements and  thrombolysis in B LE's with last stent placement occuring in 2012.  Pt is currently on anticoagulants. Pt has had 2 nerve blocks for leg pain in the past resulting in decreased sensation and function of L LE.  Pt also has decreased sensation of B feet.      Enters/exits via steps   Ambulation  Fwd 4 L   Side 2 L   Side with minisquat only 2 L  Suspend work, 75 sec each (5' total)  Jog  Barnabas Lister  Ski  X jack  Bench LE/core strengthening, B 30x each  SKTC small tucks  Straight leg, up and outs  Suspend work as above 5'  High plank position at step  2#, B 2 x 10 each   Hip ext   Hip ext pulse with assisted hold at 45 degree knee flex  High side plank at step, B 2 x 1 min each   Suspend work as above 5'  Stretching at step, B 3 x 15 sec ea  Hip flexor with lateral trunk flexion  Hamstrings    * Deferred independent hot tub time due to R knee issues  PT Education - 07/19/17 1600    Education provided  Yes    Education Details  using pain as a guide. Use of ice post activity to counteract any possible associated swelling. High plank progressed to step with hip ext work.        PT Short Term Goals - 06/05/17 1630      PT SHORT TERM GOAL #1   Title  Pt will demonstrate improved pain levels with activity to 6/10 or <.    Baseline  Worst: 9/10, Best: 6/10, 8/10 right hip pain 2/20: worst pain 8/10; 326: worst 7/10 4/30: 7/10    Time  4    Period  Weeks    Status  Partially Met    Target Date  07/03/17      PT SHORT TERM GOAL #2   Title  Pt will improve Mod ODI to 60% or less indicating severe disability in order to demonstrate self-reported improvements of pain and mobility.    Baseline  11/15/16: 72% indicating extreme disability, 52 % 2/20: 44%    Time  8    Period  Weeks    Status  Achieved        PT Long Term Goals - 06/25/17 1445      PT LONG TERM GOAL #1   Title  Pt will demonstrate independence with HEP in  order to manage pain and other symptoms.    Baseline  Patient is in aquatic therapy and is doing her exercises outside of the pool; 5/20: pt reports she is completing her land exercises every day    Time  8    Period  Weeks    Status  Achieved      PT LONG TERM GOAL #2   Title  Pt will improve gait speed to at least 1.0 m/s in order to demonstrate improvements in LE strength and decreaed pain levels in order to ambulate within limited community ambulator status.    Baseline  11/15/16: 0.5 m/s, .36 m/sec; .45 sec 01/23/17, .41 m/sec 2/20:  .57ms 3/26: .55 m/s 4/30: .58 m/s; 5/20: .43 m/s    Time  8    Period  Weeks    Status  Partially Met      PT LONG TERM GOAL #3   Title  Pt will reduce time to complete 5x sit to stand to <30 seconds in order to demonstrate improved LE strength and decreaed levels of pain, demonstrating improvement in functional mobility.    Baseline  11/15/16: 50.38 sec, 50.54 sec:01/23/17 45.33 sec, 41.66 sec 2/20: 34.8 seconds 3/26: 30 seconds; 5/20: 17.34 seconds    Time  8    Period  Weeks    Status  Achieved      PT LONG TERM GOAL #4   Title  Pt will improve Mod ODI to 40% or < indicating moderate level of disability in order to demonstrate self-reported improvements in pain and mobility.    Baseline  11/15/16: 72% indicating extreme disability, 52 % ODI; 44% ODI, 48% indicating moderate disability; 2/20: 44% 3/26: 44% 4/30: 38%    Time  8    Period  Weeks    Status  Achieved      PT LONG TERM GOAL #5   Title  Pt will improve LE strength to at least 4/5 in all limited planes in order to improve mobility, gait, and ADL ability.    Baseline  Gross strength assessment: 3/5 - 2/5 in L hip blex and B  hip ext 2/20 : pain limited MMT; 5/20: L hip F 2/5, hip abd 4/5 on R hip abd 4-/5 on L, R hip add 4/5 on R and 4-/5 on the L    Time  8    Period  Weeks    Status  Partially Met      PT LONG TERM GOAL #6   Title  Pt will reduce time to complete 5x sit to stand to  <20 seconds in order to demonstrate improved LE strength and decreaed levels of pain, demonstrating improvement in functional mobility.    Baseline  3/26: 30 seconds 4/30: 25 seconds; 5/20: 17.34 seconds    Time  8    Period  Weeks    Status  Partially Met      PT LONG TERM GOAL #7   Title  Patient will reduce timed up and go to <11 seconds to reduce fall risk and demonstrate improved transfer/gait ability    Baseline  3/26: 22 seconds 4/30: 20 seconds; 5/20: 15.33 seconds    Time  8    Period  Weeks    Status  Partially Met      PT LONG TERM GOAL #8   Title  Patient will report having "bad pain" day <2x/week demonstrating improved quality of life.     Baseline  4/30: 2x/week; 5/20: pt reports having 2x/wk "bad days"    Time  8    Period  Weeks    Status  On-going            Plan - 07/19/17 1602    Clinical Impression Statement  Pt tolerated session well without any increased R knee pain/symptoms. Encouraged icing several times today. Pt able to progress core stabilization into high plank position with hip extension work. Continue to require verbal cues for correct form in high side plank, but once attained holds well. Continue to progress all strengthening throughout LEs and core for improved functional mobility and activity tolerance.     Rehab Potential  Fair    Clinical Impairments Affecting Rehab Potential  age, perception of dx, comorbidities    PT Frequency  2x / week    PT Duration  8 weeks    PT Treatment/Interventions  ADLs/Self Care Home Management;Aquatic Therapy;Cryotherapy;Moist Heat;Traction;Ultrasound;Electrical Stimulation;Gait training;Stair training;Functional mobility training;Therapeutic activities;Therapeutic exercise;Balance training;Neuromuscular re-education;Patient/family education;Manual techniques;Passive range of motion;Dry needling;Taping    PT Next Visit Plan  continue aquatic therapy    PT Home Exercise Plan  abdominal bracing in hooklying      Consulted and Agree with Plan of Care  Patient       Patient will benefit from skilled therapeutic intervention in order to improve the following deficits and impairments:  Abnormal gait, Decreased activity tolerance, Decreased endurance, Decreased mobility, Decreased range of motion, Decreased strength, Difficulty walking, Hypomobility, Increased muscle spasms, Pain, Impaired flexibility, Improper body mechanics, Postural dysfunction  Visit Diagnosis: Chronic midline low back pain, with sciatica presence unspecified  Muscle weakness (generalized)  Difficulty in walking, not elsewhere classified  Chronic right-sided low back pain, with sciatica presence unspecified     Problem List Patient Active Problem List   Diagnosis Date Noted  . Deep vein thrombosis (DVT) ([place]) 04/08/2015  . May-Thurner syndrome 04/08/2015  . Overdose 02/09/2015  . Ache in joint 11/21/2013  . Arteriovenous fistula ([place]) 06/20/2013  . Absolute anemia 10/19/2012  . Recurrent major depressive disorder, in partial remission ([place]) 07/13/2012  . Chronic pain 01/22/2012  . Clinical depression 01/22/2012  .  Disease of lung 01/22/2012  . Fibromyalgia 01/22/2012  . LBP (low back pain) 01/22/2012  . Cannot sleep 01/22/2012  . Emotional neurotic disorder 01/22/2012  . Cannabis abuse, continuous 01/22/2012  . Pulmonary embolism with infarction ([place]) 01/22/2012  . Esophagitis, reflux 01/22/2012  . Current tobacco use 01/22/2012  . Narcotic drug use 11/02/2010  . Post-phlebitic syndrome 07/05/2010  . Borderline personality disorder ([place]) 06/15/2010  . Chronic deep vein thrombosis (DVT) of lower extremity ([place]) 06/15/2010  . Hereditary and idiopathic neuropathy 06/15/2010  . Neurosis, posttraumatic 06/15/2010    Lindsey Willis 07/19/2017, 4:06 PM  [place] MAIN [facility] SERVICES [address] [place], [place], [address] Phone: [phone]   Fax:  [phone]  Name:  Lindsey Willis MRN: [id number] Date of Birth: 09-16-1975

## 2017-07-23 DIAGNOSIS — M25561 Pain in right knee: Secondary | ICD-10-CM | POA: Diagnosis not present

## 2017-07-23 DIAGNOSIS — G8929 Other chronic pain: Secondary | ICD-10-CM | POA: Diagnosis not present

## 2017-07-23 DIAGNOSIS — M2391 Unspecified internal derangement of right knee: Secondary | ICD-10-CM | POA: Diagnosis not present

## 2017-07-26 ENCOUNTER — Ambulatory Visit: Payer: PPO

## 2017-07-26 ENCOUNTER — Other Ambulatory Visit: Payer: Self-pay

## 2017-07-26 DIAGNOSIS — M6281 Muscle weakness (generalized): Secondary | ICD-10-CM

## 2017-07-26 DIAGNOSIS — M545 Low back pain: Secondary | ICD-10-CM

## 2017-07-26 DIAGNOSIS — G8929 Other chronic pain: Secondary | ICD-10-CM

## 2017-07-26 DIAGNOSIS — R262 Difficulty in walking, not elsewhere classified: Secondary | ICD-10-CM

## 2017-07-26 NOTE — Therapy (Signed)
[place] MAIN [facility] SERVICES [address] [place], [place], [address] Phone: [phone]   Fax:  [phone]  Physical Therapy Treatment  Patient Details  Name: Lindsey Willis MRN: [id number] Date of Birth: 05-19-1975 Referring Provider: Fabio Bering   Encounter Date: 07/26/2017  PT End of Session - 07/26/17 1614    Visit Number  35    Number of Visits  44    Date for PT Re-Evaluation  07/31/17    PT Start Time  4481    PT Stop Time  1315    PT Time Calculation (min)  60 min    Activity Tolerance  Patient tolerated treatment well;Other (comment)    Behavior During Therapy  WFL for tasks assessed/performed       Past Medical History:  Diagnosis Date  . Anemia   . Asthma   . Borderline personality disorder ([place])   . Depression   . DVT of leg (deep venous thrombosis) ([place])   . Fibromyalgia   . May-Thurner syndrome   . PTSD (post-traumatic stress disorder)     Past Surgical History:  Procedure Laterality Date  . BREAST CYST ASPIRATION Right    neg  . CHOLECYSTECTOMY    . IVC FILTER PLACEMENT ([facility] HX)    . LEG SURGERY    . TUBAL LIGATION      There were no vitals filed for this visit.  Subjective Assessment - 07/26/17 1610    Subjective  Pt reports receiving injection into R knee on Monday 07/23/17 with noted relief by the next day. Currently only mild pinch pain in L medial patella/knee. Pt notes MD states part of cartilage behind kneecap was torn away. Pt may eventually seek surgery for this.     Pertinent History  Pt has had chronic low back pain for 10+ years.  Pt has hx of anxiety, asthma, chronic LBP, major depressive disorder, fibromyalgia, May-Thurner Syndrome, RLS and PTSD.  Pt has had a hx of LE DVT's and pulmonary embolism with multiple stene placements and thrombolysis in B LE's with last stent placement occuring in 2012.  Pt is currently on anticoagulants. Pt has had 2 nerve blocks for leg pain in the past  resulting in decreased sensation and function of L LE.  Pt also has decreased sensation of B feet.     * Avoid unnecessary increased flex/ext of R knee    Ambulation  4 L fwd  4 L side  High plank position with 2# ankle wts  3 rounds 1 min each (total 6 min)   Quick small hip ext   [employer] LE strength  2# ankle wts, 30x ea all on R then L   Hip abd   Hip 45 deg angle ext   Hip ext  Core strength, 30x ea  Abdominal crunch, 1 blue dumbbell  Oblique crunch, 1 blue dumbbell  High plank speed round as above  Bench LE/core strength  2# ankle wts, up and outs 30x ea  Single leg stance with toe touch other with ball toss catch, 20x ea B  High plank work as above, but 2 rounds 1' each (total 4 min)  Stretching  Hamstrings/calves  SKTC  cross body piriformis                           PT Education - 07/26/17 1612    Education provided  Yes    Education Details  Ice massage for small local pain R knee. Educated regarding avoiding overuse flex/ext of R knee with activity ie steps and exercise even in the pool. Cardio intervals in high plank    Person(s) Educated  Patient    Methods  Explanation;Demonstration    Comprehension  Verbalized understanding;Returned demonstration;Verbal cues required       PT Short Term Goals - 06/05/17 1630      PT SHORT TERM GOAL #1   Title  Pt will demonstrate improved pain levels with activity to 6/10 or <.    Baseline  Worst: 9/10, Best: 6/10, 8/10 right hip pain 2/20: worst pain 8/10; 326: worst 7/10 4/30: 7/10    Time  4    Period  Weeks    Status  Partially Met    Target Date  07/03/17      PT SHORT TERM GOAL #2   Title  Pt will improve Mod ODI to 60% or less indicating severe disability in order to demonstrate self-reported improvements of pain and mobility.    Baseline  11/15/16: 72% indicating extreme disability, 52 % 2/20: 44%    Time  8    Period  Weeks    Status  Achieved        PT Long Term  Goals - 06/25/17 1445      PT LONG TERM GOAL #1   Title  Pt will demonstrate independence with HEP in order to manage pain and other symptoms.    Baseline  Patient is in aquatic therapy and is doing her exercises outside of the pool; 5/20: pt reports she is completing her land exercises every day    Time  8    Period  Weeks    Status  Achieved      PT LONG TERM GOAL #2   Title  Pt will improve gait speed to at least 1.0 m/s in order to demonstrate improvements in LE strength and decreaed pain levels in order to ambulate within limited community ambulator status.    Baseline  11/15/16: 0.5 m/s, .36 m/sec; .45 sec 01/23/17, .41 m/sec 2/20:  .20ms 3/26: .55 m/s 4/30: .58 m/s; 5/20: .43 m/s    Time  8    Period  Weeks    Status  Partially Met      PT LONG TERM GOAL #3   Title  Pt will reduce time to complete 5x sit to stand to <30 seconds in order to demonstrate improved LE strength and decreaed levels of pain, demonstrating improvement in functional mobility.    Baseline  11/15/16: 50.38 sec, 50.54 sec:01/23/17 45.33 sec, 41.66 sec 2/20: 34.8 seconds 3/26: 30 seconds; 5/20: 17.34 seconds    Time  8    Period  Weeks    Status  Achieved      PT LONG TERM GOAL #4   Title  Pt will improve Mod ODI to 40% or < indicating moderate level of disability in order to demonstrate self-reported improvements in pain and mobility.    Baseline  11/15/16: 72% indicating extreme disability, 52 % ODI; 44% ODI, 48% indicating moderate disability; 2/20: 44% 3/26: 44% 4/30: 38%    Time  8    Period  Weeks    Status  Achieved      PT LONG TERM GOAL #5   Title  Pt will improve LE strength to at least 4/5 in all limited planes in order to improve mobility, gait, and ADL ability.    Baseline  Gross strength  assessment: 3/5 - 2/5 in L hip blex and B hip ext 2/20 : pain limited MMT; 5/20: L hip F 2/5, hip abd 4/5 on R hip abd 4-/5 on L, R hip add 4/5 on R and 4-/5 on the L    Time  8    Period  Weeks    Status   Partially Met      PT LONG TERM GOAL #6   Title  Pt will reduce time to complete 5x sit to stand to <20 seconds in order to demonstrate improved LE strength and decreaed levels of pain, demonstrating improvement in functional mobility.    Baseline  3/26: 30 seconds 4/30: 25 seconds; 5/20: 17.34 seconds    Time  8    Period  Weeks    Status  Partially Met      PT LONG TERM GOAL #7   Title  Patient will reduce timed up and go to <11 seconds to reduce fall risk and demonstrate improved transfer/gait ability    Baseline  3/26: 22 seconds 4/30: 20 seconds; 5/20: 15.33 seconds    Time  8    Period  Weeks    Status  Partially Met      PT LONG TERM GOAL #8   Title  Patient will report having "bad pain" day <2x/week demonstrating improved quality of life.     Baseline  4/30: 2x/week; 5/20: pt reports having 2x/wk "bad days"    Time  8    Period  Weeks    Status  On-going            Plan - 07/26/17 1619    Clinical Impression Statement  Pt 15 minutes late to session today. Tolerated progression of core and LE strengthening with LE knee extended positions to avoid unneccessary flex/ext to allow healing of R knee. No pain complaints. Continue to progress in this fashion.        Patient will benefit from skilled therapeutic intervention in order to improve the following deficits and impairments:  Abnormal gait, Decreased activity tolerance, Decreased endurance, Decreased mobility, Decreased range of motion, Decreased strength, Difficulty walking, Hypomobility, Increased muscle spasms, Pain, Impaired flexibility, Improper body mechanics, Postural dysfunction  Visit Diagnosis: Difficulty in walking, not elsewhere classified  Muscle weakness (generalized)  Chronic right-sided low back pain, with sciatica presence unspecified     Problem List Patient Active Problem List   Diagnosis Date Noted  . Deep vein thrombosis (DVT) ([place]) 04/08/2015  . May-Thurner syndrome 04/08/2015  .  Overdose 02/09/2015  . Ache in joint 11/21/2013  . Arteriovenous fistula ([place]) 06/20/2013  . Absolute anemia 10/19/2012  . Recurrent major depressive disorder, in partial remission ([place]) 07/13/2012  . Chronic pain 01/22/2012  . Clinical depression 01/22/2012  . Disease of lung 01/22/2012  . Fibromyalgia 01/22/2012  . LBP (low back pain) 01/22/2012  . Cannot sleep 01/22/2012  . Emotional neurotic disorder 01/22/2012  . Cannabis abuse, continuous 01/22/2012  . Pulmonary embolism with infarction ([place]) 01/22/2012  . Esophagitis, reflux 01/22/2012  . Current tobacco use 01/22/2012  . Narcotic drug use 11/02/2010  . Post-phlebitic syndrome 07/05/2010  . Borderline personality disorder ([place]) 06/15/2010  . Chronic deep vein thrombosis (DVT) of lower extremity ([place]) 06/15/2010  . Hereditary and idiopathic neuropathy 06/15/2010  . Neurosis, posttraumatic 06/15/2010    Larae Grooms 07/26/2017, 4:22 PM  [place] MAIN [facility] SERVICES [address] [place], [place], [address] Phone: [phone]   Fax:  [phone]  Name: YAEKO FAZEKAS MRN: [id number] Date of Birth: August 11, 1975

## 2017-08-02 ENCOUNTER — Ambulatory Visit: Payer: PPO | Admitting: Physical Therapy

## 2017-08-02 ENCOUNTER — Encounter: Payer: Self-pay | Admitting: Physical Therapy

## 2017-08-02 DIAGNOSIS — M545 Low back pain: Secondary | ICD-10-CM

## 2017-08-02 DIAGNOSIS — M6281 Muscle weakness (generalized): Secondary | ICD-10-CM

## 2017-08-02 DIAGNOSIS — G8929 Other chronic pain: Secondary | ICD-10-CM

## 2017-08-02 DIAGNOSIS — R262 Difficulty in walking, not elsewhere classified: Secondary | ICD-10-CM

## 2017-08-02 NOTE — Therapy (Signed)
[place] MAIN [facility] SERVICES [address] [place], [place], [address] Phone: [phone]   Fax:  [phone]  Physical Therapy Treatment  Patient Details  Name: Lindsey Willis MRN: [id number] Date of Birth: 1975-07-10 Referring Provider: Fabio Bering   Encounter Date: 08/02/2017  PT End of Session - 08/02/17 1114    Visit Number  36    Number of Visits  44    Date for PT Re-Evaluation  09/25/17    PT Start Time  1100    PT Stop Time  1145    PT Time Calculation (min)  45 min    Activity Tolerance  Patient tolerated treatment well;Other (comment)    Behavior During Therapy  WFL for tasks assessed/performed       Past Medical History:  Diagnosis Date  . Anemia   . Asthma   . Borderline personality disorder ([place])   . Depression   . DVT of leg (deep venous thrombosis) ([place])   . Fibromyalgia   . May-Thurner syndrome   . PTSD (post-traumatic stress disorder)     Past Surgical History:  Procedure Laterality Date  . BREAST CYST ASPIRATION Right    neg  . CHOLECYSTECTOMY    . IVC FILTER PLACEMENT ([facility] HX)    . LEG SURGERY    . TUBAL LIGATION      There were no vitals filed for this visit.  Subjective Assessment - 08/02/17 1112    Subjective  Pt reports receiving injection into R knee on Monday 07/23/17 with noted relief by the next day. Currently only mild pinch pain in L medial patella/knee. Pt notes MD states part of cartilage behind kneecap was torn away. Pt may eventually seek surgery for this.     Pertinent History  Pt has had chronic low back pain for 10+ years.  Pt has hx of anxiety, asthma, chronic LBP, major depressive disorder, fibromyalgia, May-Thurner Syndrome, RLS and PTSD.  Pt has had a hx of LE DVT's and pulmonary embolism with multiple stene placements and thrombolysis in B LE's with last stent placement occuring in 2012.  Pt is currently on anticoagulants. Pt has had 2 nerve blocks for leg pain in the past  resulting in decreased sensation and function of L LE.  Pt also has decreased sensation of B feet.    Limitations  Sitting;Walking;Writing;Lifting;House hold activities;Standing    How long can you sit comfortably?  15-20 minutes    How long can you stand comfortably?  10-15 minutes     How long can you walk comfortably?  10-15 minutes    Diagnostic tests  none    Patient Stated Goals  continue reducing pain and move better.     Currently in Pain?  Yes    Pain Score  7     Pain Location  Hip    Pain Orientation  Right    Pain Descriptors / Indicators  Aching    Pain Onset  More than a month ago    Pain Frequency  Constant    Aggravating Factors   walking, rain    Pain Relieving Factors  rest    Multiple Pain Sites  No      Therapeutic activities:  Patient performed outcome measures with progress made in all goals except 5 x sit to stand.   Patients plan of care was reviewed and goals discussed for progress  PT Education - 08/02/17 1113    Education provided  Yes    Education Details  progress with goals    Person(s) Educated  Patient    Methods  Explanation    Comprehension  Verbalized understanding       PT Short Term Goals - 06/05/17 1630      PT SHORT TERM GOAL #1   Title  Pt will demonstrate improved pain levels with activity to 6/10 or <.    Baseline  Worst: 9/10, Best: 6/10, 8/10 right hip pain 2/20: worst pain 8/10; 326: worst 7/10 4/30: 7/10    Time  4    Period  Weeks    Status  Partially Met    Target Date  07/03/17      PT SHORT TERM GOAL #2   Title  Pt will improve Mod ODI to 60% or less indicating severe disability in order to demonstrate self-reported improvements of pain and mobility.    Baseline  11/15/16: 72% indicating extreme disability, 52 % 2/20: 44%    Time  8    Period  Weeks    Status  Achieved        PT Long Term Goals - 08/02/17 1115      PT LONG TERM GOAL #1   Title  Pt will demonstrate  independence with HEP in order to manage pain and other symptoms.    Baseline  Patient is in aquatic therapy and is doing her exercises outside of the pool; 5/20: pt reports she is completing her land exercises every day    Time  8    Period  Weeks    Status  Achieved      PT LONG TERM GOAL #2   Title  Pt will improve gait speed to at least 1.0 m/s in order to demonstrate improvements in LE strength and decreaed pain levels in order to ambulate within limited community ambulator status.    Baseline  11/15/16: 0.5 m/s, .36 m/sec; .45 sec 01/23/17, .41 m/sec 2/20:  .41ms 3/26: .55 m/s 4/30: .58 m/s; 5/20: .43 m/s: 08/02/17=.63 m/sec    Time  8    Period  Weeks    Status  Partially Met    Target Date  09/27/17      PT LONG TERM GOAL #3   Title  Pt will reduce time to complete 5x sit to stand to <30 seconds in order to demonstrate improved LE strength and decreaed levels of pain, demonstrating improvement in functional mobility.    Baseline  11/15/16: 50.38 sec, 50.54 sec:01/23/17 45.33 sec, 41.66 sec 2/20: 34.8 seconds 3/26: 30 seconds; 5/20: 17.34 seconds:    Time  8    Period  Weeks    Status  Achieved      PT LONG TERM GOAL #4   Title  Pt will improve Mod ODI to 40% or < indicating moderate level of disability in order to demonstrate self-reported improvements in pain and mobility.    Baseline  11/15/16: 72% indicating extreme disability, 52 % ODI; 44% ODI, 48% indicating moderate disability; 2/20: 44% 3/26: 44% 4/30: 38% 08/02/17 = 36%    Time  8    Period  Weeks    Status  Achieved      PT LONG TERM GOAL #5   Title  Pt will improve LE strength to at least 4/5 in all limited planes in order to improve mobility, gait, and ADL ability.    Baseline  Gross strength assessment:  3/5 - 2/5 in L hip blex and B hip ext 2/20 : pain limited MMT; 5/20: L hip F 2/5, hip abd 4/5 on R hip abd 4-/5 on L, R hip add 4/5 on R and 4-/5 on the L    Time  8    Period  Weeks    Status  Partially Met     Target Date  09/27/17      PT LONG TERM GOAL #6   Title  Pt will reduce time to complete 5x sit to stand to <20 seconds in order to demonstrate improved LE strength and decreaed levels of pain, demonstrating improvement in functional mobility.    Baseline  3/26: 30 seconds 4/30: 25 seconds; 5/20: 17.34 seconds, 08/02/17 = 24.40 sec    Time  8    Period  Weeks    Status  Partially Met    Target Date  09/27/17      PT LONG TERM GOAL #7   Title  Patient will reduce timed up and go to <11 seconds to reduce fall risk and demonstrate improved transfer/gait ability    Baseline  3/26: 22 seconds 4/30: 20 seconds; 5/20: 15.33 seconds; 08/02/17 = 13.40    Time  8    Period  Weeks    Status  Partially Met    Target Date  09/27/17      PT LONG TERM GOAL #8   Title  Patient will report having "bad pain" day <2x/week demonstrating improved quality of life.     Baseline  4/30: 2x/week; 5/20: pt reports having 2x/wk "bad days"; 08/02/17 this week was less than 2 x     Time  8    Period  Weeks    Status  Partially Met    Target Date  09/27/17            Plan - 08/02/17 1138    Clinical Impression Statement  Patient continues to have deficits in strength of 4/5 and -4/5 in BLE, gait with spc.and pain in RLE and back. Patient is improving with functional tasks with improved mobiltiy, and is making functional improvements, and improving in her outcome measures.  Patient will benefit from continued skilled PT to improve strength, gait, decrease pain, and reach functional goals. Patient needs several rest periods throughout treatment due to fatigue    Rehab Potential  Fair    Clinical Impairments Affecting Rehab Potential  age, perception of dx, comorbidities    PT Frequency  2x / week    PT Duration  8 weeks    PT Treatment/Interventions  ADLs/Self Care Home Management;Aquatic Therapy;Cryotherapy;Moist Heat;Traction;Ultrasound;Electrical Stimulation;Gait training;Stair training;Functional mobility  training;Therapeutic activities;Therapeutic exercise;Balance training;Neuromuscular re-education;Patient/family education;Manual techniques;Passive range of motion;Dry needling;Taping    PT Next Visit Plan  continue aquatic therapy    PT Home Exercise Plan  abdominal bracing in hooklying     Consulted and Agree with Plan of Care  Patient       Patient will benefit from skilled therapeutic intervention in order to improve the following deficits and impairments:  Abnormal gait, Decreased activity tolerance, Decreased endurance, Decreased mobility, Decreased range of motion, Decreased strength, Difficulty walking, Hypomobility, Increased muscle spasms, Pain, Impaired flexibility, Improper body mechanics, Postural dysfunction  Visit Diagnosis: Difficulty in walking, not elsewhere classified  Muscle weakness (generalized)  Chronic right-sided low back pain, with sciatica presence unspecified  Chronic midline low back pain, with sciatica presence unspecified     Problem List Patient Active Problem List  Diagnosis Date Noted  . Deep vein thrombosis (DVT) ([place]) 04/08/2015  . May-Thurner syndrome 04/08/2015  . Overdose 02/09/2015  . Ache in joint 11/21/2013  . Arteriovenous fistula ([place]) 06/20/2013  . Absolute anemia 10/19/2012  . Recurrent major depressive disorder, in partial remission ([place]) 07/13/2012  . Chronic pain 01/22/2012  . Clinical depression 01/22/2012  . Disease of lung 01/22/2012  . Fibromyalgia 01/22/2012  . LBP (low back pain) 01/22/2012  . Cannot sleep 01/22/2012  . Emotional neurotic disorder 01/22/2012  . Cannabis abuse, continuous 01/22/2012  . Pulmonary embolism with infarction ([place]) 01/22/2012  . Esophagitis, reflux 01/22/2012  . Current tobacco use 01/22/2012  . Narcotic drug use 11/02/2010  . Post-phlebitic syndrome 07/05/2010  . Borderline personality disorder ([place]) 06/15/2010  . Chronic deep vein thrombosis (DVT) of lower extremity ([place]) 06/15/2010  .  Hereditary and idiopathic neuropathy 06/15/2010  . Neurosis, posttraumatic 06/15/2010    Alanson Puls, PT DPT 08/02/2017, 11:38 AM  [place] MAIN [facility] SERVICES [address] [place], [place], [address] Phone: [phone]   Fax:  [phone]  Name: Lindsey Willis MRN: [id number] Date of Birth: 1975/03/24

## 2017-08-03 ENCOUNTER — Ambulatory Visit: Payer: PPO

## 2017-08-07 ENCOUNTER — Ambulatory Visit: Payer: PPO | Attending: Psychiatry

## 2017-08-07 DIAGNOSIS — G8929 Other chronic pain: Secondary | ICD-10-CM | POA: Diagnosis not present

## 2017-08-07 DIAGNOSIS — M545 Low back pain: Secondary | ICD-10-CM | POA: Insufficient documentation

## 2017-08-07 DIAGNOSIS — R262 Difficulty in walking, not elsewhere classified: Secondary | ICD-10-CM | POA: Insufficient documentation

## 2017-08-07 DIAGNOSIS — M6281 Muscle weakness (generalized): Secondary | ICD-10-CM | POA: Insufficient documentation

## 2017-08-07 NOTE — Therapy (Signed)
[place] MAIN [facility] SERVICES [address] [place], [place], [address] Phone: [phone]   Fax:  [phone]  Physical Therapy Treatment  Patient Details  Name: Lindsey Willis MRN: [id number] Date of Birth: 07-16-1975 Referring Provider: Fabio Bering   Encounter Date: 08/07/2017  PT End of Session - 08/07/17 1645    Visit Number  37    Number of Visits  44    Date for PT Re-Evaluation  09/25/17    PT Start Time  0800    PT Stop Time  0845    PT Time Calculation (min)  45 min    Activity Tolerance  Other (comment);Patient limited by pain    Behavior During Therapy  [facility] for tasks assessed/performed       Past Medical History:  Diagnosis Date  . Anemia   . Asthma   . Borderline personality disorder ([place])   . Depression   . DVT of leg (deep venous thrombosis) ([place])   . Fibromyalgia   . May-Thurner syndrome   . PTSD (post-traumatic stress disorder)     Past Surgical History:  Procedure Laterality Date  . BREAST CYST ASPIRATION Right    neg  . CHOLECYSTECTOMY    . IVC FILTER PLACEMENT ([facility] HX)    . LEG SURGERY    . TUBAL LIGATION      There were no vitals filed for this visit.  Subjective Assessment - 08/07/17 1642    Subjective  Pt has increased complaints of pain in R hip that began over the weekend. Pt denies any specific event causing pain; currently pain is 7.5.     Pertinent History  Pt has had chronic low back pain for 10+ years.  Pt has hx of anxiety, asthma, chronic LBP, major depressive disorder, fibromyalgia, May-Thurner Syndrome, RLS and PTSD.  Pt has had a hx of LE DVT's and pulmonary embolism with multiple stene placements and thrombolysis in B LE's with last stent placement occuring in 2012.  Pt is currently on anticoagulants. Pt has had 2 nerve blocks for leg pain in the past resulting in decreased sensation and function of L LE.  Pt also has decreased sensation of B feet.      Enters/exits via ramp  today  Ambulation, slow  Fwd 6L  Side 2L  Noodle hang with active stretching for hip flexors, extensors B Noodle hang pendulums with active abd'r stretching Unable to tolerate single for add'r stretching  Noodle hand with single leg hip circles cw/ccw, B  Progressive distance side lunge for B add'r stretching  Slow bike on bench 5'  Independent stretching in hot tub post session.                           PT Short Term Goals - 06/05/17 1630      PT SHORT TERM GOAL #1   Title  Pt will demonstrate improved pain levels with activity to 6/10 or <.    Baseline  Worst: 9/10, Best: 6/10, 8/10 right hip pain 2/20: worst pain 8/10; 326: worst 7/10 4/30: 7/10    Time  4    Period  Weeks    Status  Partially Met    Target Date  07/03/17      PT SHORT TERM GOAL #2   Title  Pt will improve Mod ODI to 60% or less indicating severe disability in order to demonstrate self-reported improvements of pain and mobility.  Baseline  11/15/16: 72% indicating extreme disability, 52 % 2/20: 44%    Time  8    Period  Weeks    Status  Achieved        PT Long Term Goals - 08/02/17 1115      PT LONG TERM GOAL #1   Title  Pt will demonstrate independence with HEP in order to manage pain and other symptoms.    Baseline  Patient is in aquatic therapy and is doing her exercises outside of the pool; 5/20: pt reports she is completing her land exercises every day    Time  8    Period  Weeks    Status  Achieved      PT LONG TERM GOAL #2   Title  Pt will improve gait speed to at least 1.0 m/s in order to demonstrate improvements in LE strength and decreaed pain levels in order to ambulate within limited community ambulator status.    Baseline  11/15/16: 0.5 m/s, .36 m/sec; .45 sec 01/23/17, .41 m/sec 2/20:  .43ms 3/26: .55 m/s 4/30: .58 m/s; 5/20: .43 m/s: 08/02/17=.63 m/sec    Time  8    Period  Weeks    Status  Partially Met    Target Date  09/27/17      PT LONG TERM  GOAL #3   Title  Pt will reduce time to complete 5x sit to stand to <30 seconds in order to demonstrate improved LE strength and decreaed levels of pain, demonstrating improvement in functional mobility.    Baseline  11/15/16: 50.38 sec, 50.54 sec:01/23/17 45.33 sec, 41.66 sec 2/20: 34.8 seconds 3/26: 30 seconds; 5/20: 17.34 seconds:    Time  8    Period  Weeks    Status  Achieved      PT LONG TERM GOAL #4   Title  Pt will improve Mod ODI to 40% or < indicating moderate level of disability in order to demonstrate self-reported improvements in pain and mobility.    Baseline  11/15/16: 72% indicating extreme disability, 52 % ODI; 44% ODI, 48% indicating moderate disability; 2/20: 44% 3/26: 44% 4/30: 38% 08/02/17 = 36%    Time  8    Period  Weeks    Status  Achieved      PT LONG TERM GOAL #5   Title  Pt will improve LE strength to at least 4/5 in all limited planes in order to improve mobility, gait, and ADL ability.    Baseline  Gross strength assessment: 3/5 - 2/5 in L hip blex and B hip ext 2/20 : pain limited MMT; 5/20: L hip F 2/5, hip abd 4/5 on R hip abd 4-/5 on L, R hip add 4/5 on R and 4-/5 on the L    Time  8    Period  Weeks    Status  Partially Met    Target Date  09/27/17      PT LONG TERM GOAL #6   Title  Pt will reduce time to complete 5x sit to stand to <20 seconds in order to demonstrate improved LE strength and decreaed levels of pain, demonstrating improvement in functional mobility.    Baseline  3/26: 30 seconds 4/30: 25 seconds; 5/20: 17.34 seconds, 08/02/17 = 24.40 sec    Time  8    Period  Weeks    Status  Partially Met    Target Date  09/27/17      PT LONG TERM GOAL #7  Title  Patient will reduce timed up and go to <11 seconds to reduce fall risk and demonstrate improved transfer/gait ability    Baseline  3/26: 22 seconds 4/30: 20 seconds; 5/20: 15.33 seconds; 08/02/17 = 13.40    Time  8    Period  Weeks    Status  Partially Met    Target Date  09/27/17       PT LONG TERM GOAL #8   Title  Patient will report having "bad pain" day <2x/week demonstrating improved quality of life.     Baseline  4/30: 2x/week; 5/20: pt reports having 2x/wk "bad days"; 08/02/17 this week was less than 2 x     Time  8    Period  Weeks    Status  Partially Met    Target Date  09/27/17            Plan - 08/07/17 1645    Clinical Impression Statement  Pt tolerates session with greater focus on slow range and active stretching with little resolve of increased pain at session's end. Continue PT to progress strength, range and stability throughout core/LEs based on pt symptoms.     Rehab Potential  Fair    Clinical Impairments Affecting Rehab Potential  age, perception of dx, comorbidities    PT Frequency  2x / week    PT Duration  8 weeks    PT Treatment/Interventions  ADLs/Self Care Home Management;Aquatic Therapy;Cryotherapy;Moist Heat;Traction;Ultrasound;Electrical Stimulation;Gait training;Stair training;Functional mobility training;Therapeutic activities;Therapeutic exercise;Balance training;Neuromuscular re-education;Patient/family education;Manual techniques;Passive range of motion;Dry needling;Taping    PT Next Visit Plan  continue aquatic therapy    PT Home Exercise Plan  abdominal bracing in hooklying     Consulted and Agree with Plan of Care  Patient       Patient will benefit from skilled therapeutic intervention in order to improve the following deficits and impairments:  Abnormal gait, Decreased activity tolerance, Decreased endurance, Decreased mobility, Decreased range of motion, Decreased strength, Difficulty walking, Hypomobility, Increased muscle spasms, Pain, Impaired flexibility, Improper body mechanics, Postural dysfunction  Visit Diagnosis: Difficulty in walking, not elsewhere classified  Muscle weakness (generalized)  Chronic right-sided low back pain, with sciatica presence unspecified  Chronic midline low back pain, with sciatica  presence unspecified     Problem List Patient Active Problem List   Diagnosis Date Noted  . Deep vein thrombosis (DVT) ([place]) 04/08/2015  . May-Thurner syndrome 04/08/2015  . Overdose 02/09/2015  . Ache in joint 11/21/2013  . Arteriovenous fistula ([place]) 06/20/2013  . Absolute anemia 10/19/2012  . Recurrent major depressive disorder, in partial remission ([place]) 07/13/2012  . Chronic pain 01/22/2012  . Clinical depression 01/22/2012  . Disease of lung 01/22/2012  . Fibromyalgia 01/22/2012  . LBP (low back pain) 01/22/2012  . Cannot sleep 01/22/2012  . Emotional neurotic disorder 01/22/2012  . Cannabis abuse, continuous 01/22/2012  . Pulmonary embolism with infarction ([place]) 01/22/2012  . Esophagitis, reflux 01/22/2012  . Current tobacco use 01/22/2012  . Narcotic drug use 11/02/2010  . Post-phlebitic syndrome 07/05/2010  . Borderline personality disorder ([place]) 06/15/2010  . Chronic deep vein thrombosis (DVT) of lower extremity ([place]) 06/15/2010  . Hereditary and idiopathic neuropathy 06/15/2010  . Neurosis, posttraumatic 06/15/2010    Larae Grooms 08/07/2017, 4:49 PM  [place] MAIN [facility] SERVICES [address] [place], [place], [address] Phone: [phone]   Fax:  [phone]  Name: EMANII BUGBEE MRN: [id number] Date of Birth: [birth year]

## 2017-08-16 ENCOUNTER — Ambulatory Visit: Payer: PPO

## 2017-08-16 ENCOUNTER — Other Ambulatory Visit: Payer: Self-pay

## 2017-08-16 DIAGNOSIS — R262 Difficulty in walking, not elsewhere classified: Secondary | ICD-10-CM

## 2017-08-16 DIAGNOSIS — M6281 Muscle weakness (generalized): Secondary | ICD-10-CM

## 2017-08-16 DIAGNOSIS — M545 Low back pain: Secondary | ICD-10-CM

## 2017-08-16 DIAGNOSIS — G8929 Other chronic pain: Secondary | ICD-10-CM

## 2017-08-16 NOTE — Therapy (Signed)
[place] MAIN [facility] SERVICES [address] [place], [place], [address] Phone: [phone]   Fax:  [phone]  Physical Therapy Treatment  Patient Details  Name: Lindsey Willis MRN: [id number] Date of Birth: [birth year] Referring Provider: Fabio Bering   Encounter Date: 08/16/2017  PT End of Session - 08/16/17 1747    Visit Number  38    Number of Visits  44    Date for PT Re-Evaluation  09/25/17    PT Start Time  1340    PT Stop Time  1435    PT Time Calculation (min)  55 min    Activity Tolerance  Treatment limited secondary to medical complications (Comment)    Behavior During Therapy  [facility] for tasks assessed/performed       Past Medical History:  Diagnosis Date  . Anemia   . Asthma   . Borderline personality disorder ([place])   . Depression   . DVT of leg (deep venous thrombosis) ([place])   . Fibromyalgia   . May-Thurner syndrome   . PTSD (post-traumatic stress disorder)     Past Surgical History:  Procedure Laterality Date  . BREAST CYST ASPIRATION Right    neg  . CHOLECYSTECTOMY    . IVC FILTER PLACEMENT ([facility] HX)    . LEG SURGERY    . TUBAL LIGATION      There were no vitals filed for this visit.  Subjective Assessment - 08/16/17 1744    Subjective  Pt reports fall on July 4th with R ankle (rolling) with swelling (continues current) and mild bruising now starting. Pt notes 6/10 pain. Pt denies seeing MD or receiving x'ray, but notes being able to walk on it with and without cane.     Pertinent History  Pt has had chronic low back pain for 10+ years.  Pt has hx of anxiety, asthma, chronic LBP, major depressive disorder, fibromyalgia, May-Thurner Syndrome, RLS and PTSD.  Pt has had a hx of LE DVT's and pulmonary embolism with multiple stene placements and thrombolysis in B LE's with last stent placement occuring in 2012.  Pt is currently on anticoagulants. Pt has had 2 nerve blocks for leg pain in the past resulting in  decreased sensation and function of L LE.  Pt also has decreased sensation of B feet.      Enters/exits via steps  Ambulation, blue dumbbells  Fwd 4 L  side 4 L  side with squat 2L  Bicycle (hanging from 2 blue noodles), 10 min  Bench  L STS (R foot propped with SL) no UE use, 20x  Step (second to last)  STS using BLE, no UE, 12x  R SLR with ball of foot touch L in waist deep water, 20x ea  Chest press  Open close  Arm swings  Over head press  Seated R ankle range of motion, 20x each  DF/PF  [place]/ever  Cw/ccw circles                           PT Education - 08/16/17 1746    Education provided  Yes    Education Details  STS from lower surface and SL STS. Ankle range of motion all planes.        PT Short Term Goals - 06/05/17 1630      PT SHORT TERM GOAL #1   Title  Pt will demonstrate improved pain levels with activity to 6/10 or <.  Baseline  Worst: 9/10, Best: 6/10, 8/10 right hip pain 2/20: worst pain 8/10; 326: worst 7/10 4/30: 7/10    Time  4    Period  Weeks    Status  Partially Met    Target Date  07/03/17      PT SHORT TERM GOAL #2   Title  Pt will improve Mod ODI to 60% or less indicating severe disability in order to demonstrate self-reported improvements of pain and mobility.    Baseline  11/15/16: 72% indicating extreme disability, 52 % 2/20: 44%    Time  8    Period  Weeks    Status  Achieved        PT Long Term Goals - 08/02/17 1115      PT LONG TERM GOAL #1   Title  Pt will demonstrate independence with HEP in order to manage pain and other symptoms.    Baseline  Patient is in aquatic therapy and is doing her exercises outside of the pool; 5/20: pt reports she is completing her land exercises every day    Time  8    Period  Weeks    Status  Achieved      PT LONG TERM GOAL #2   Title  Pt will improve gait speed to at least 1.0 m/s in order to demonstrate improvements in LE strength and decreaed pain levels in  order to ambulate within limited community ambulator status.    Baseline  11/15/16: 0.5 m/s, .36 m/sec; .45 sec 01/23/17, .41 m/sec 2/20:  .22ms 3/26: .55 m/s 4/30: .58 m/s; 5/20: .43 m/s: 08/02/17=.63 m/sec    Time  8    Period  Weeks    Status  Partially Met    Target Date  09/27/17      PT LONG TERM GOAL #3   Title  Pt will reduce time to complete 5x sit to stand to <30 seconds in order to demonstrate improved LE strength and decreaed levels of pain, demonstrating improvement in functional mobility.    Baseline  11/15/16: 50.38 sec, 50.54 sec:01/23/17 45.33 sec, 41.66 sec 2/20: 34.8 seconds 3/26: 30 seconds; 5/20: 17.34 seconds:    Time  8    Period  Weeks    Status  Achieved      PT LONG TERM GOAL #4   Title  Pt will improve Mod ODI to 40% or < indicating moderate level of disability in order to demonstrate self-reported improvements in pain and mobility.    Baseline  11/15/16: 72% indicating extreme disability, 52 % ODI; 44% ODI, 48% indicating moderate disability; 2/20: 44% 3/26: 44% 4/30: 38% 08/02/17 = 36%    Time  8    Period  Weeks    Status  Achieved      PT LONG TERM GOAL #5   Title  Pt will improve LE strength to at least 4/5 in all limited planes in order to improve mobility, gait, and ADL ability.    Baseline  Gross strength assessment: 3/5 - 2/5 in L hip blex and B hip ext 2/20 : pain limited MMT; 5/20: L hip F 2/5, hip abd 4/5 on R hip abd 4-/5 on L, R hip add 4/5 on R and 4-/5 on the L    Time  8    Period  Weeks    Status  Partially Met    Target Date  09/27/17      PT LONG TERM GOAL #6   Title  Pt  will reduce time to complete 5x sit to stand to <20 seconds in order to demonstrate improved LE strength and decreaed levels of pain, demonstrating improvement in functional mobility.    Baseline  3/26: 30 seconds 4/30: 25 seconds; 5/20: 17.34 seconds, 08/02/17 = 24.40 sec    Time  8    Period  Weeks    Status  Partially Met    Target Date  09/27/17      PT LONG TERM  GOAL #7   Title  Patient will reduce timed up and go to <11 seconds to reduce fall risk and demonstrate improved transfer/gait ability    Baseline  3/26: 22 seconds 4/30: 20 seconds; 5/20: 15.33 seconds; 08/02/17 = 13.40    Time  8    Period  Weeks    Status  Partially Met    Target Date  09/27/17      PT LONG TERM GOAL #8   Title  Patient will report having "bad pain" day <2x/week demonstrating improved quality of life.     Baseline  4/30: 2x/week; 5/20: pt reports having 2x/wk "bad days"; 08/02/17 this week was less than 2 x     Time  8    Period  Weeks    Status  Partially Met    Target Date  09/27/17            Plan - 08/16/17 1748    Clinical Impression Statement  Pt tolerated treatment well despite R ankle. Caution provided to avoid excess work in stand on RLE. Continue to focus on increasing LE strength and particular functional exercises to improve STS transfer time. Pt educated on ice, elevation and range of motion to manage ankle swelling. Pt notes she will see MD if worsening symptoms at any time.     Rehab Potential  Fair    Clinical Impairments Affecting Rehab Potential  age, perception of dx, comorbidities    PT Frequency  2x / week    PT Duration  8 weeks    PT Treatment/Interventions  ADLs/Self Care Home Management;Aquatic Therapy;Cryotherapy;Moist Heat;Traction;Ultrasound;Electrical Stimulation;Gait training;Stair training;Functional mobility training;Therapeutic activities;Therapeutic exercise;Balance training;Neuromuscular re-education;Patient/family education;Manual techniques;Passive range of motion;Dry needling;Taping    PT Next Visit Plan  continue aquatic therapy    PT Home Exercise Plan  abdominal bracing in hooklying     Consulted and Agree with Plan of Care  Patient       Patient will benefit from skilled therapeutic intervention in order to improve the following deficits and impairments:  Abnormal gait, Decreased activity tolerance, Decreased endurance,  Decreased mobility, Decreased range of motion, Decreased strength, Difficulty walking, Hypomobility, Increased muscle spasms, Pain, Impaired flexibility, Improper body mechanics, Postural dysfunction  Visit Diagnosis: Difficulty in walking, not elsewhere classified  Muscle weakness (generalized)  Chronic right-sided low back pain, with sciatica presence unspecified  Chronic midline low back pain, with sciatica presence unspecified     Problem List Patient Active Problem List   Diagnosis Date Noted  . Deep vein thrombosis (DVT) ([place]) 04/08/2015  . May-Thurner syndrome 04/08/2015  . Overdose 02/09/2015  . Ache in joint 11/21/2013  . Arteriovenous fistula ([place]) 06/20/2013  . Absolute anemia 10/19/2012  . Recurrent major depressive disorder, in partial remission ([place]) 07/13/2012  . Chronic pain 01/22/2012  . Clinical depression 01/22/2012  . Disease of lung 01/22/2012  . Fibromyalgia 01/22/2012  . LBP (low back pain) 01/22/2012  . Cannot sleep 01/22/2012  . Emotional neurotic disorder 01/22/2012  . Cannabis abuse, continuous 01/22/2012  .  Pulmonary embolism with infarction ([place]) 01/22/2012  . Esophagitis, reflux 01/22/2012  . Current tobacco use 01/22/2012  . Narcotic drug use 11/02/2010  . Post-phlebitic syndrome 07/05/2010  . Borderline personality disorder ([place]) 06/15/2010  . Chronic deep vein thrombosis (DVT) of lower extremity ([place]) 06/15/2010  . Hereditary and idiopathic neuropathy 06/15/2010  . Neurosis, posttraumatic 06/15/2010    Larae Grooms 08/16/2017, 5:52 PM  [place] MAIN [facility] SERVICES [address] [place], [place], [address] Phone: [phone]   Fax:  [phone]  Name: ASENETH HACK MRN: [id number] Date of Birth: 08-Jul-1975

## 2017-08-23 ENCOUNTER — Other Ambulatory Visit: Payer: Self-pay

## 2017-08-23 ENCOUNTER — Ambulatory Visit: Payer: PPO

## 2017-08-23 DIAGNOSIS — R262 Difficulty in walking, not elsewhere classified: Secondary | ICD-10-CM | POA: Diagnosis not present

## 2017-08-23 DIAGNOSIS — G8929 Other chronic pain: Secondary | ICD-10-CM

## 2017-08-23 DIAGNOSIS — M6281 Muscle weakness (generalized): Secondary | ICD-10-CM

## 2017-08-23 DIAGNOSIS — M25551 Pain in right hip: Secondary | ICD-10-CM | POA: Diagnosis not present

## 2017-08-23 DIAGNOSIS — M545 Low back pain: Secondary | ICD-10-CM

## 2017-08-23 NOTE — Therapy (Signed)
[place] MAIN [facility] SERVICES [address] [place], [place], [address] Phone: [phone]   Fax:  [phone]  Physical Therapy Treatment  Patient Details  Name: Lindsey Willis MRN: [id number] Date of Birth: January 20, 1976 Referring Provider: Fabio Bering   Encounter Date: 08/23/2017  PT End of Session - 08/23/17 1559    Visit Number  39    Number of Visits  44    Date for PT Re-Evaluation  09/25/17    PT Start Time  1115    PT Stop Time  1135    PT Time Calculation (min)  20 min       Past Medical History:  Diagnosis Date  . Anemia   . Asthma   . Borderline personality disorder ([place])   . Depression   . DVT of leg (deep venous thrombosis) ([place])   . Fibromyalgia   . May-Thurner syndrome   . PTSD (post-traumatic stress disorder)     Past Surgical History:  Procedure Laterality Date  . BREAST CYST ASPIRATION Right    neg  . CHOLECYSTECTOMY    . IVC FILTER PLACEMENT ([facility] HX)    . LEG SURGERY    . TUBAL LIGATION      There were no vitals filed for this visit.  Subjective Assessment - 08/23/17 1554    Subjective  Pt reports R knee and ankle are without pain; R ankle continues with lateral swelling, but decreasing. Pt reports an increase in posterior hip pain beginning 3-4 days ago. Pt notes it is unbearable and constant requiring her to be in bed all day yesterday except for bathroom use and obtaining meals in kitchen. Pt observed entering facility with baseline gait, managed in locker room and entered pool in no obvious increased distress.     Pertinent History  Pt has had chronic low back pain for 10+ years.  Pt has hx of anxiety, asthma, chronic LBP, major depressive disorder, fibromyalgia, May-Thurner Syndrome, RLS and PTSD.  Pt has had a hx of LE DVT's and pulmonary embolism with multiple stene placements and thrombolysis in B LE's with last stent placement occuring in 2012.  Pt is currently on anticoagulants. Pt has had 2  nerve blocks for leg pain in the past resulting in decreased sensation and function of L LE.  Pt also has decreased sensation of B feet.      Tolerated 1/2 L fwd ambulation with blue dumbbells  Seated SKTC and piriformis stretching  Attempted stand R ITB stretch, but unable to tolerate.                          PT Education - 08/23/17 1558    Education provided  Yes    Education Details  Obtaining MD opinion regarding pain in R posterior hip       PT Short Term Goals - 06/05/17 1630      PT SHORT TERM GOAL #1   Title  Pt will demonstrate improved pain levels with activity to 6/10 or <.    Baseline  Worst: 9/10, Best: 6/10, 8/10 right hip pain 2/20: worst pain 8/10; 326: worst 7/10 4/30: 7/10    Time  4    Period  Weeks    Status  Partially Met    Target Date  07/03/17      PT SHORT TERM GOAL #2   Title  Pt will improve Mod ODI to 60% or less indicating severe  disability in order to demonstrate self-reported improvements of pain and mobility.    Baseline  11/15/16: 72% indicating extreme disability, 52 % 2/20: 44%    Time  8    Period  Weeks    Status  Achieved        PT Long Term Goals - 08/02/17 1115      PT LONG TERM GOAL #1   Title  Pt will demonstrate independence with HEP in order to manage pain and other symptoms.    Baseline  Patient is in aquatic therapy and is doing her exercises outside of the pool; 5/20: pt reports she is completing her land exercises every day    Time  8    Period  Weeks    Status  Achieved      PT LONG TERM GOAL #2   Title  Pt will improve gait speed to at least 1.0 m/s in order to demonstrate improvements in LE strength and decreaed pain levels in order to ambulate within limited community ambulator status.    Baseline  11/15/16: 0.5 m/s, .36 m/sec; .45 sec 01/23/17, .41 m/sec 2/20:  .60ms 3/26: .55 m/s 4/30: .58 m/s; 5/20: .43 m/s: 08/02/17=.63 m/sec    Time  8    Period  Weeks    Status  Partially Met    Target  Date  09/27/17      PT LONG TERM GOAL #3   Title  Pt will reduce time to complete 5x sit to stand to <30 seconds in order to demonstrate improved LE strength and decreaed levels of pain, demonstrating improvement in functional mobility.    Baseline  11/15/16: 50.38 sec, 50.54 sec:01/23/17 45.33 sec, 41.66 sec 2/20: 34.8 seconds 3/26: 30 seconds; 5/20: 17.34 seconds:    Time  8    Period  Weeks    Status  Achieved      PT LONG TERM GOAL #4   Title  Pt will improve Mod ODI to 40% or < indicating moderate level of disability in order to demonstrate self-reported improvements in pain and mobility.    Baseline  11/15/16: 72% indicating extreme disability, 52 % ODI; 44% ODI, 48% indicating moderate disability; 2/20: 44% 3/26: 44% 4/30: 38% 08/02/17 = 36%    Time  8    Period  Weeks    Status  Achieved      PT LONG TERM GOAL #5   Title  Pt will improve LE strength to at least 4/5 in all limited planes in order to improve mobility, gait, and ADL ability.    Baseline  Gross strength assessment: 3/5 - 2/5 in L hip blex and B hip ext 2/20 : pain limited MMT; 5/20: L hip F 2/5, hip abd 4/5 on R hip abd 4-/5 on L, R hip add 4/5 on R and 4-/5 on the L    Time  8    Period  Weeks    Status  Partially Met    Target Date  09/27/17      PT LONG TERM GOAL #6   Title  Pt will reduce time to complete 5x sit to stand to <20 seconds in order to demonstrate improved LE strength and decreaed levels of pain, demonstrating improvement in functional mobility.    Baseline  3/26: 30 seconds 4/30: 25 seconds; 5/20: 17.34 seconds, 08/02/17 = 24.40 sec    Time  8    Period  Weeks    Status  Partially Met    Target  Date  09/27/17      PT LONG TERM GOAL #7   Title  Patient will reduce timed up and go to <11 seconds to reduce fall risk and demonstrate improved transfer/gait ability    Baseline  3/26: 22 seconds 4/30: 20 seconds; 5/20: 15.33 seconds; 08/02/17 = 13.40    Time  8    Period  Weeks    Status  Partially Met     Target Date  09/27/17      PT LONG TERM GOAL #8   Title  Patient will report having "bad pain" day <2x/week demonstrating improved quality of life.     Baseline  4/30: 2x/week; 5/20: pt reports having 2x/wk "bad days"; 08/02/17 this week was less than 2 x     Time  8    Period  Weeks    Status  Partially Met    Target Date  09/27/17            Plan - 08/23/17 1559    Clinical Impression Statement  As noted earlier in subjective, pt initially in no obvious distress. After entering pool area, pt able to demonstrate forward hinge at hip to sign insurance authorizaation and ambulating with SPC at baseline. Pt stands to describe increased posterior hip pain. Therapist touches pt very superficially on superior/posterior hip in attempt to understand pain description and pt burst into yelling out/crying in pain with near collapse of RLE requirig Min A to stand and assist to seated position. Further discussion regarding pain and ability to participate in session today finds pt able to no longer display distress ( pt notes pain; however is constant and unchanged for several days). Pt wishes to attempt therapy today despite fact pt feels pain is bad enough to consider and ED visit. Pt ambulates pool deck and ramp without event at baseline fashion; however after 1/2 L in chest deep water, pt again has an episode of yelling/crying out in pain and inability to tolerate stand requiring assist to bench in pool. After brief period of continued distress, pt returns to no overt distress other than describing R hip pain as unbearable. Pt wishes to try some strething in the water, and manages some seated LB/piriformis gentle stretching; however, with attempt stand ITB stretch on R, pt unable to tolerate with elevated pain episode again. Informed pt that she definitely needed to cease therapy for today and seek medical attention if pain is in fact this severe. Pt offered w/c to exit pool; however, pt declined and was  able to again ambulate out, into locker room and later to car without episode. Monitor closely would prefer MD clearance to continue to clarify issue versus potential magnification of symptoms. Pt does have extensive medical history.     Rehab Potential  Fair    Clinical Impairments Affecting Rehab Potential  age, perception of dx, comorbidities    PT Frequency  2x / week    PT Duration  8 weeks    PT Treatment/Interventions  ADLs/Self Care Home Management;Aquatic Therapy;Cryotherapy;Moist Heat;Traction;Ultrasound;Electrical Stimulation;Gait training;Stair training;Functional mobility training;Therapeutic activities;Therapeutic exercise;Balance training;Neuromuscular re-education;Patient/family education;Manual techniques;Passive range of motion;Dry needling;Taping    PT Next Visit Plan  continue aquatic therapy    PT Home Exercise Plan  abdominal bracing in hooklying     Consulted and Agree with Plan of Care  Patient       Patient will benefit from skilled therapeutic intervention in order to improve the following deficits and impairments:  Abnormal gait, Decreased activity tolerance,  Decreased endurance, Decreased mobility, Decreased range of motion, Decreased strength, Difficulty walking, Hypomobility, Increased muscle spasms, Pain, Impaired flexibility, Improper body mechanics, Postural dysfunction  Visit Diagnosis: Difficulty in walking, not elsewhere classified  Muscle weakness (generalized)  Chronic right-sided low back pain, with sciatica presence unspecified  Chronic midline low back pain, with sciatica presence unspecified     Problem List Patient Active Problem List   Diagnosis Date Noted  . Deep vein thrombosis (DVT) ([place]) 04/08/2015  . May-Thurner syndrome 04/08/2015  . Overdose 02/09/2015  . Ache in joint 11/21/2013  . Arteriovenous fistula ([place]) 06/20/2013  . Absolute anemia 10/19/2012  . Recurrent major depressive disorder, in partial remission ([place]) 07/13/2012  .  Chronic pain 01/22/2012  . Clinical depression 01/22/2012  . Disease of lung 01/22/2012  . Fibromyalgia 01/22/2012  . LBP (low back pain) 01/22/2012  . Cannot sleep 01/22/2012  . Emotional neurotic disorder 01/22/2012  . Cannabis abuse, continuous 01/22/2012  . Pulmonary embolism with infarction ([place]) 01/22/2012  . Esophagitis, reflux 01/22/2012  . Current tobacco use 01/22/2012  . Narcotic drug use 11/02/2010  . Post-phlebitic syndrome 07/05/2010  . Borderline personality disorder ([place]) 06/15/2010  . Chronic deep vein thrombosis (DVT) of lower extremity ([place]) 06/15/2010  . Hereditary and idiopathic neuropathy 06/15/2010  . Neurosis, posttraumatic 06/15/2010    Larae Grooms 08/23/2017, 4:17 PM  [place] MAIN [facility] SERVICES [address] [place], [place], [address] Phone: [phone]   Fax:  [phone]  Name: Lindsey Willis MRN: [id number] Date of Birth: May 20, 1975

## 2017-08-28 ENCOUNTER — Ambulatory Visit: Payer: PPO

## 2017-09-06 ENCOUNTER — Ambulatory Visit: Payer: PPO

## 2017-09-06 ENCOUNTER — Ambulatory Visit: Payer: PPO | Attending: Psychiatry

## 2017-09-06 DIAGNOSIS — M545 Low back pain: Secondary | ICD-10-CM | POA: Insufficient documentation

## 2017-09-06 DIAGNOSIS — R262 Difficulty in walking, not elsewhere classified: Secondary | ICD-10-CM | POA: Diagnosis not present

## 2017-09-06 DIAGNOSIS — M6281 Muscle weakness (generalized): Secondary | ICD-10-CM | POA: Insufficient documentation

## 2017-09-06 DIAGNOSIS — G8929 Other chronic pain: Secondary | ICD-10-CM | POA: Insufficient documentation

## 2017-09-06 NOTE — Therapy (Signed)
[place] MAIN [facility] SERVICES [address] [place], [place], [address] Phone: [phone]   Fax:  [phone]  Physical Therapy Treatment  Patient Details  Name: Lindsey Willis MRN: [id number] Date of Birth: Nov 30, 1975 Referring Provider: Fabio Bering   Encounter Date: 09/06/2017  PT End of Session - 09/06/17 1729    Visit Number  40    Number of Visits  44    Date for PT Re-Evaluation  09/25/17    PT Start Time  0503    PT Stop Time  0520    PT Time Calculation (min)  17 min    Activity Tolerance  Treatment limited secondary to medical complications (Comment) R hip pain    Behavior During Therapy  Anxious       Past Medical History:  Diagnosis Date  . Anemia   . Asthma   . Borderline personality disorder ([place])   . Depression   . DVT of leg (deep venous thrombosis) ([place])   . Fibromyalgia   . May-Thurner syndrome   . PTSD (post-traumatic stress disorder)     Past Surgical History:  Procedure Laterality Date  . BREAST CYST ASPIRATION Right    neg  . CHOLECYSTECTOMY    . IVC FILTER PLACEMENT ([facility] HX)    . LEG SURGERY    . TUBAL LIGATION      There were no vitals filed for this visit.  Subjective Assessment - 09/06/17 1727    Subjective  Went to primary care for L hip pain. Patient fell on the 4th fall down the steps at his apartment complex, landed on L side. Didn't feel pain then but felt it about a week after.  Patient reports its not the same pain she normally has, pain is up in the R iliac crest. Had severe pain at the pool upon palpation, had to cancel last pool session due to pain. Patient reports pain has not changed since then. Gets jumping pain after standing or sitting for a long time. Aches for the most part. Patient reports doctor wants a CAT scan of R hip.     Pertinent History  Pt has had chronic low back pain for 10+ years.  Pt has hx of anxiety, asthma, chronic LBP, major depressive disorder,  fibromyalgia, May-Thurner Syndrome, RLS and PTSD.  Pt has had a hx of LE DVT's and pulmonary embolism with multiple stene placements and thrombolysis in B LE's with last stent placement occuring in 2012.  Pt is currently on anticoagulants. Pt has had 2 nerve blocks for leg pain in the past resulting in decreased sensation and function of L LE.  Pt also has decreased sensation of B feet.    Limitations  Sitting;Walking;Writing;Lifting;House hold activities;Standing    How long can you sit comfortably?  15-20 minutes    How long can you stand comfortably?  10-15 minutes     How long can you walk comfortably?  10-15 minutes    Diagnostic tests  none    Patient Stated Goals  continue reducing pain and move better.     Currently in Pain?  Yes    Pain Score  8     Pain Location  Hip    Pain Orientation  Right    Pain Descriptors / Indicators  Aching    Pain Type  Acute pain    Pain Onset  1 to 4 weeks ago    Pain Frequency  Intermittent    Aggravating Factors  weightbearing       VAS: 9/10 worst pain, average pain: 7/10 ; pain worsens with weightbearing.    Palpation: tender to palpation of R medial mid ischium region radiating to lateral border of iliac crest   LE strength:   Right Left  Hip flexion pain 3/5  Hip Abduction 3+/5 4-/5  Hip Adduction 3+/5 4-/5  Knee Extension  4-/5 3/5  Knee Flexion 4-/5 3/5  DF 4-/5 4-/5  PF 4-/5 4-/5      Bad pain days: 2 days/week;                    PT Education - 09/06/17 1728    Education provided  Yes    Education Details  need for imaging for R hip prior to return to therapy     Person(s) Educated  Patient    Methods  Explanation    Comprehension  Verbalized understanding       PT Short Term Goals - 09/06/17 1731      PT SHORT TERM GOAL #1   Title  Pt will demonstrate improved pain levels with activity to 6/10 or <.    Baseline  Worst: 9/10, Best: 6/10, 8/10 right hip pain 2/20: worst pain 8/10; 326: worst 7/10  4/30: 7/10 8/1: : 9/10 worst pain, average pain: 7/10 ; pain worsens with weightbearing.     Time  4    Period  Weeks    Status  Partially Met      PT SHORT TERM GOAL #2   Title  Pt will improve Mod ODI to 60% or less indicating severe disability in order to demonstrate self-reported improvements of pain and mobility.    Baseline  11/15/16: 72% indicating extreme disability, 52 % 2/20: 44%    Time  8    Period  Weeks    Status  Achieved        PT Long Term Goals - 09/06/17 1731      PT LONG TERM GOAL #1   Title  Pt will demonstrate independence with HEP in order to manage pain and other symptoms.    Baseline  Patient is in aquatic therapy and is doing her exercises outside of the pool; 5/20: pt reports she is completing her land exercises every day    Time  8    Period  Weeks    Status  Achieved      PT LONG TERM GOAL #2   Title  Pt will improve gait speed to at least 1.0 m/s in order to demonstrate improvements in LE strength and decreaed pain levels in order to ambulate within limited community ambulator status.    Baseline  11/15/16: 0.5 m/s, .36 m/sec; .45 sec 01/23/17, .41 m/sec 2/20:  .27ms 3/26: .55 m/s 4/30: .58 m/s; 5/20: .43 m/s: 08/02/17=.63 m/sec    Time  8    Period  Weeks    Status  Partially Met      PT LONG TERM GOAL #3   Title  Pt will reduce time to complete 5x sit to stand to <30 seconds in order to demonstrate improved LE strength and decreaed levels of pain, demonstrating improvement in functional mobility.    Baseline  11/15/16: 50.38 sec, 50.54 sec:01/23/17 45.33 sec, 41.66 sec 2/20: 34.8 seconds 3/26: 30 seconds; 5/20: 17.34 seconds:    Time  8    Period  Weeks    Status  Achieved      PT LONG TERM GOAL #  4   Title  Pt will improve Mod ODI to 40% or < indicating moderate level of disability in order to demonstrate self-reported improvements in pain and mobility.    Baseline  11/15/16: 72% indicating extreme disability, 52 % ODI; 44% ODI, 48% indicating  moderate disability; 2/20: 44% 3/26: 44% 4/30: 38% 08/02/17 = 36%    Time  8    Period  Weeks    Status  Achieved      PT LONG TERM GOAL #5   Title  Pt will improve LE strength to at least 4/5 in all limited planes in order to improve mobility, gait, and ADL ability.    Baseline  Gross strength assessment: 3/5 - 2/5 in L hip blex and B hip ext 2/20 : pain limited MMT; 5/20: L hip F 2/5, hip abd 4/5 on R hip abd 4-/5 on L, R hip add 4/5 on R and 4-/5 on the L    Time  8    Period  Weeks    Status  Partially Met      PT LONG TERM GOAL #6   Title  Pt will reduce time to complete 5x sit to stand to <20 seconds in order to demonstrate improved LE strength and decreaed levels of pain, demonstrating improvement in functional mobility.    Baseline  3/26: 30 seconds 4/30: 25 seconds; 5/20: 17.34 seconds, 08/02/17 = 24.40 sec    Time  8    Period  Weeks    Status  Partially Met      PT LONG TERM GOAL #7   Title  Patient will reduce timed up and go to <11 seconds to reduce fall risk and demonstrate improved transfer/gait ability    Baseline  3/26: 22 seconds 4/30: 20 seconds; 5/20: 15.33 seconds; 08/02/17 = 13.40    Time  8    Period  Weeks    Status  Partially Met      PT LONG TERM GOAL #8   Title  Patient will report having "bad pain" day <2x/week demonstrating improved quality of life.     Baseline  4/30: 2x/week; 5/20: pt reports having 2x/wk "bad days"; 08/02/17 this week was less than 2 x 8/1: 2x    Time  8    Period  Weeks    Status  Partially Met            Plan - 09/06/17 1730    Clinical Impression Statement  Due to patient's high pain that is increased with weightbearing and palpation combined with doctor's desire for patient to have CAT scan, patient will be placed on hold until scan results are in clearing patient for further treatment. Upon clearance of imaging patient to return back to land physical therapy for re-assessment of aquatic therapy return.     Rehab Potential   Fair    Clinical Impairments Affecting Rehab Potential  age, perception of dx, comorbidities    PT Frequency  2x / week    PT Duration  8 weeks    PT Treatment/Interventions  ADLs/Self Care Home Management;Aquatic Therapy;Cryotherapy;Moist Heat;Traction;Ultrasound;Electrical Stimulation;Gait training;Stair training;Functional mobility training;Therapeutic activities;Therapeutic exercise;Balance training;Neuromuscular re-education;Patient/family education;Manual techniques;Passive range of motion;Dry needling;Taping    PT Next Visit Plan  continue aquatic therapy    PT Home Exercise Plan  abdominal bracing in hooklying     Consulted and Agree with Plan of Care  Patient       Patient will benefit from skilled therapeutic intervention in order to improve  the following deficits and impairments:  Abnormal gait, Decreased activity tolerance, Decreased endurance, Decreased mobility, Decreased range of motion, Decreased strength, Difficulty walking, Hypomobility, Increased muscle spasms, Pain, Impaired flexibility, Improper body mechanics, Postural dysfunction  Visit Diagnosis: Difficulty in walking, not elsewhere classified  Muscle weakness (generalized)  Chronic right-sided low back pain, with sciatica presence unspecified  Chronic midline low back pain, with sciatica presence unspecified     Problem List Patient Active Problem List   Diagnosis Date Noted  . Deep vein thrombosis (DVT) ([place]) 04/08/2015  . May-Thurner syndrome 04/08/2015  . Overdose 02/09/2015  . Ache in joint 11/21/2013  . Arteriovenous fistula ([place]) 06/20/2013  . Absolute anemia 10/19/2012  . Recurrent major depressive disorder, in partial remission ([place]) 07/13/2012  . Chronic pain 01/22/2012  . Clinical depression 01/22/2012  . Disease of lung 01/22/2012  . Fibromyalgia 01/22/2012  . LBP (low back pain) 01/22/2012  . Cannot sleep 01/22/2012  . Emotional neurotic disorder 01/22/2012  . Cannabis abuse, continuous  01/22/2012  . Pulmonary embolism with infarction ([place]) 01/22/2012  . Esophagitis, reflux 01/22/2012  . Current tobacco use 01/22/2012  . Narcotic drug use 11/02/2010  . Post-phlebitic syndrome 07/05/2010  . Borderline personality disorder ([place]) 06/15/2010  . Chronic deep vein thrombosis (DVT) of lower extremity ([place]) 06/15/2010  . Hereditary and idiopathic neuropathy 06/15/2010  . Neurosis, posttraumatic 06/15/2010   Janna Arch, PT, DPT   09/06/2017, 5:33 PM  [place] MAIN [facility] SERVICES [address] [place], [place], [address] Phone: [phone]   Fax:  [phone]  Name: Lindsey Willis MRN: [id number] Date of Birth: 11/28/1975

## 2017-09-11 ENCOUNTER — Ambulatory Visit: Payer: PPO

## 2017-09-20 ENCOUNTER — Ambulatory Visit: Payer: PPO

## 2017-09-20 ENCOUNTER — Other Ambulatory Visit: Payer: Self-pay | Admitting: Family Medicine

## 2017-09-20 DIAGNOSIS — G8929 Other chronic pain: Secondary | ICD-10-CM | POA: Diagnosis not present

## 2017-09-20 DIAGNOSIS — M25551 Pain in right hip: Secondary | ICD-10-CM | POA: Diagnosis not present

## 2017-09-20 DIAGNOSIS — Z1231 Encounter for screening mammogram for malignant neoplasm of breast: Secondary | ICD-10-CM

## 2017-09-20 DIAGNOSIS — N926 Irregular menstruation, unspecified: Secondary | ICD-10-CM | POA: Diagnosis not present

## 2017-09-25 ENCOUNTER — Ambulatory Visit: Payer: PPO

## 2017-09-27 ENCOUNTER — Encounter (HOSPITAL_COMMUNITY): Payer: Self-pay

## 2017-09-27 ENCOUNTER — Ambulatory Visit
Admission: RE | Admit: 2017-09-27 | Discharge: 2017-09-27 | Disposition: A | Discharge status: Home / Self Care | Payer: PPO | Source: Ambulatory Visit | Attending: Family Medicine | Admitting: Family Medicine

## 2017-09-27 DIAGNOSIS — G8929 Other chronic pain: Secondary | ICD-10-CM | POA: Diagnosis not present

## 2017-09-27 DIAGNOSIS — M545 Low back pain: Secondary | ICD-10-CM | POA: Diagnosis not present

## 2017-09-27 DIAGNOSIS — Z1231 Encounter for screening mammogram for malignant neoplasm of breast: Secondary | ICD-10-CM | POA: Insufficient documentation

## 2017-09-27 DIAGNOSIS — M47816 Spondylosis without myelopathy or radiculopathy, lumbar region: Secondary | ICD-10-CM | POA: Diagnosis not present

## 2017-10-02 ENCOUNTER — Ambulatory Visit: Payer: PPO

## 2017-10-02 DIAGNOSIS — R262 Difficulty in walking, not elsewhere classified: Secondary | ICD-10-CM

## 2017-10-02 DIAGNOSIS — M545 Low back pain: Secondary | ICD-10-CM

## 2017-10-02 DIAGNOSIS — G8929 Other chronic pain: Secondary | ICD-10-CM

## 2017-10-02 DIAGNOSIS — M6281 Muscle weakness (generalized): Secondary | ICD-10-CM

## 2017-10-02 NOTE — Therapy (Signed)
[place] MAIN [facility] SERVICES [address] [place], [place], [address] Phone: [phone]   Fax:  [phone]  Physical Therapy Treatment  Patient Details  Name: Lindsey Willis MRN: [id number] Date of Birth: 1976-02-04 Referring Provider: Fabio Bering   Encounter Date: 10/02/2017  PT End of Session - 10/02/17 1439    Visit Number  41    Number of Visits  5    Date for PT Re-Evaluation  11/27/17    Authorization Type  1/10 progress note start 8/27     PT Start Time  1425    PT Stop Time  1510    PT Time Calculation (min)  45 min    Activity Tolerance  Treatment limited secondary to medical complications (Comment)   R hip pain   Behavior During Therapy  Anxious       Past Medical History:  Diagnosis Date  . Anemia   . Asthma   . Borderline personality disorder ([place])   . Depression   . DVT of leg (deep venous thrombosis) ([place])   . Fibromyalgia   . May-Thurner syndrome   . PTSD (post-traumatic stress disorder)     Past Surgical History:  Procedure Laterality Date  . BREAST CYST ASPIRATION Right    neg  . CHOLECYSTECTOMY    . IVC FILTER PLACEMENT ([facility] HX)    . LEG SURGERY    . TUBAL LIGATION      There were no vitals filed for this visit.  Subjective Assessment - 10/02/17 1431    Subjective  Patient went to ortho doctor, had imaging done, and was cleared to return to PT. Was told it was a muscular strain.  Has been going to gym while waiting for PT for maintenance.     Pertinent History  Pt has had chronic low back pain for 10+ years.  Pt has hx of anxiety, asthma, chronic LBP, major depressive disorder, fibromyalgia, May-Thurner Syndrome, RLS and PTSD.  Pt has had a hx of LE DVT's and pulmonary embolism with multiple stene placements and thrombolysis in B LE's with last stent placement occuring in 2012.  Pt is currently on anticoagulants. Pt has had 2 nerve blocks for leg pain in the past resulting in decreased  sensation and function of L LE.  Pt also has decreased sensation of B feet.    Limitations  Sitting;Walking;Writing;Lifting;House hold activities;Standing    How long can you sit comfortably?  15-20 minutes    How long can you stand comfortably?  10-15 minutes     How long can you walk comfortably?  10-15 minutes    Diagnostic tests  none    Patient Stated Goals  continue reducing pain and move better.     Currently in Pain?  Yes    Pain Score  6     Pain Location  Back    Pain Orientation  Right;Other (Comment)    Pain Descriptors / Indicators  Aching    Pain Type  Acute pain    Pain Onset  1 to 4 weeks ago    Pain Frequency  Intermittent         RECERT for pool 10 MWT: .59 m/s 5xSTS: 25 seconds LE strength  Right Left  Hip flexion 3+/5 2/5  Hip Abduction 3-/5 pain 3-/5 pain  Hip Adduction 2/5 pain 2/5 pain  Knee Extension  3/5 3/5  Knee Flexion 3/5 3/5  DF 3+/5 3+/5  PF 3+/5 3+/5  TUG : 19 seconds =.53 m/s MODI: 42%   Patient went to ortho doctor, had imaging done, and was cleared to return to PT. Was told it was a muscular strain.  Has been going to gym while waiting for PT for maintenance.    VAS 6/10; a little stiff Worst VAS 8/10 R low back     Short arc distractionRLEand LLE4x15 seconds Long arc distractionRLEand LLE4x 15 seconds Hamstring stretch 2x30 seconds                     PT Education - 10/02/17 1525    Education provided  Yes    Education Details  return to aquatic PT, goals, POC    Person(s) Educated  Patient    Methods  Explanation;Demonstration    Comprehension  Verbalized understanding;Returned demonstration       PT Short Term Goals - 10/02/17 1440      PT SHORT TERM GOAL #1   Title  Pt will demonstrate improved pain levels with activity to 6/10 or <.    Baseline  Worst: 9/10, Best: 6/10, 8/10 right hip pain 2/20: worst pain 8/10; 326: worst 7/10 4/30: 7/10 8/1: : 9/10 worst pain, average pain: 7/10 ; pain  worsens with weightbearing. 8/27: 8/10    Time  4    Period  Weeks    Status  Partially Met    Target Date  10/16/17      PT SHORT TERM GOAL #2   Title  Pt will improve Mod ODI to 60% or less indicating severe disability in order to demonstrate self-reported improvements of pain and mobility.    Baseline  11/15/16: 72% indicating extreme disability, 52 % 2/20: 44%    Time  8    Period  Weeks    Status  Achieved        PT Long Term Goals - 10/02/17 1441      PT LONG TERM GOAL #1   Title  Pt will demonstrate independence with HEP in order to manage pain and other symptoms.    Baseline  Patient is in aquatic therapy and is doing her exercises outside of the pool; 5/20: pt reports she is completing her land exercises every day    Time  8    Period  Weeks    Status  Achieved      PT LONG TERM GOAL #2   Title  Pt will improve gait speed to at least 1.0 m/s in order to demonstrate improvements in LE strength and decreaed pain levels in order to ambulate within limited community ambulator status.    Baseline  11/15/16: 0.5 m/s, .36 m/sec; .45 sec 01/23/17, .41 m/sec 2/20:  .21ms 3/26: .55 m/s 4/30: .58 m/s; 5/20: .43 m/s: 08/02/17=.63 m/sec 8/27=.563m     Time  8    Period  Weeks    Status  Partially Met      PT LONG TERM GOAL #3   Title  Pt will reduce time to complete 5x sit to stand to <30 seconds in order to demonstrate improved LE strength and decreaed levels of pain, demonstrating improvement in functional mobility.    Baseline  11/15/16: 50.38 sec, 50.54 sec:01/23/17 45.33 sec, 41.66 sec 2/20: 34.8 seconds 3/26: 30 seconds; 5/20: 17.34 seconds:    Time  8    Period  Weeks    Status  Achieved      PT LONG TERM GOAL #4   Title  Pt will improve  Mod ODI to 40% or < indicating moderate level of disability in order to demonstrate self-reported improvements in pain and mobility.    Baseline  11/15/16: 72% indicating extreme disability, 52 % ODI; 44% ODI, 48% indicating moderate  disability; 2/20: 44% 3/26: 44% 4/30: 38% 08/02/17 = 36% 8/27: 42%    Time  8    Period  Weeks    Status  Partially Met      PT LONG TERM GOAL #5   Title  Pt will improve LE strength to at least 4/5 in all limited planes in order to improve mobility, gait, and ADL ability.    Baseline  Gross strength assessment: 3/5 - 2/5 in L hip blex and B hip ext 2/20 : pain limited MMT; 5/20: L hip F 2/5, hip abd 4/5 on R hip abd 4-/5 on L, R hip add 4/5 on R and 4-/5 on the L 8/27: 2/5 hip flexion L R 4-/5    Time  8    Period  Weeks    Status  Partially Met    Target Date  11/27/17      PT LONG TERM GOAL #6   Title  Pt will reduce time to complete 5x sit to stand to <20 seconds in order to demonstrate improved LE strength and decreaed levels of pain, demonstrating improvement in functional mobility.    Baseline  3/26: 30 seconds 4/30: 25 seconds; 5/20: 17.34 seconds, 08/02/17 = 24.40 sec; 8/27: 25 seconds    Time  8    Period  Weeks    Status  Partially Met    Target Date  11/27/17      PT LONG TERM GOAL #7   Title  Patient will reduce timed up and go to <11 seconds to reduce fall risk and demonstrate improved transfer/gait ability    Baseline  3/26: 22 seconds 4/30: 20 seconds; 5/20: 15.33 seconds; 08/02/17 = 13.40    Time  8    Period  Weeks    Status  Partially Met    Target Date  11/27/17      PT LONG TERM GOAL #8   Title  Patient will report having "bad pain" day <2x/week demonstrating improved quality of life.     Baseline  4/30: 2x/week; 5/20: pt reports having 2x/wk "bad days"; 08/02/17 this week was less than 2 x 8/1: 2x 8/27: 2x /week     Time  8    Period  Weeks    Status  Partially Met    Target Date  11/27/17            Plan - 10/02/17 1524    Clinical Impression Statement  Patient has been cleared by doctor for return to physical therapy. Imaging was clear. 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 TUG performed in 19 seconds with 10 MWT performed in .59 m/s. MODI : 42%; 5x STS 25 seconds. Strength has slight deficit from previous testing due to pain in low back with movement. Patient will continue to benefit from aquatic therapy for gravity eliminated and pain free strengthening, stabilization, and mobility to allow patient to improve quality of life.    Rehab Potential  Fair    Clinical Impairments Affecting Rehab Potential  age, perception of dx, comorbidities    PT  Frequency  2x / week    PT Duration  8 weeks    PT Treatment/Interventions  ADLs/Self Care Home Management;Aquatic Therapy;Cryotherapy;Moist Heat;Traction;Ultrasound;Electrical Stimulation;Gait training;Stair training;Functional mobility training;Therapeutic activities;Therapeutic exercise;Balance training;Neuromuscular re-education;Patient/family education;Manual techniques;Passive range of motion;Dry needling;Taping    PT Next Visit Plan  continue aquatic therapy    PT Home Exercise Plan  abdominal bracing in hooklying     Consulted and Agree with Plan of Care  Patient       Patient will benefit from skilled therapeutic intervention in order to improve the following deficits and impairments:  Abnormal gait, Decreased activity tolerance, Decreased endurance, Decreased mobility, Decreased range of motion, Decreased strength, Difficulty walking, Hypomobility, Increased muscle spasms, Pain, Impaired flexibility, Improper body mechanics, Postural dysfunction, Decreased balance  Visit Diagnosis: Difficulty in walking, not elsewhere classified  Muscle weakness (generalized)  Chronic right-sided low back pain, with sciatica presence unspecified  Chronic midline low back pain, with sciatica presence unspecified     Problem List Patient Active Problem List   Diagnosis Date Noted  . Deep vein  thrombosis (DVT) ([place]) 04/08/2015  . May-Thurner syndrome 04/08/2015  . Overdose 02/09/2015  . Ache in joint 11/21/2013  . Arteriovenous fistula ([place]) 06/20/2013  . Absolute anemia 10/19/2012  . Recurrent major depressive disorder, in partial remission ([place]) 07/13/2012  . Chronic pain 01/22/2012  . Clinical depression 01/22/2012  . Disease of lung 01/22/2012  . Fibromyalgia 01/22/2012  . LBP (low back pain) 01/22/2012  . Cannot sleep 01/22/2012  . Emotional neurotic disorder 01/22/2012  . Cannabis abuse, continuous 01/22/2012  . Pulmonary embolism with infarction ([place]) 01/22/2012  . Esophagitis, reflux 01/22/2012  . Current tobacco use 01/22/2012  . Narcotic drug use 11/02/2010  . Post-phlebitic syndrome 07/05/2010  . Borderline personality disorder ([place]) 06/15/2010  . Chronic deep vein thrombosis (DVT) of lower extremity ([place]) 06/15/2010  . Hereditary and idiopathic neuropathy 06/15/2010  . Neurosis, posttraumatic 06/15/2010   Janna Arch, PT, DPT   10/02/2017, 3:27 PM  [place] MAIN [facility] SERVICES [address] [place], [place], [address] Phone: [phone]   Fax:  [phone]  Name: Lindsey Willis MRN: [id number] Date of Birth: 31-May-1975

## 2017-10-09 ENCOUNTER — Ambulatory Visit: Payer: PPO | Attending: Psychiatry

## 2017-10-09 ENCOUNTER — Other Ambulatory Visit: Payer: Self-pay

## 2017-10-09 DIAGNOSIS — R262 Difficulty in walking, not elsewhere classified: Secondary | ICD-10-CM | POA: Insufficient documentation

## 2017-10-09 DIAGNOSIS — G8929 Other chronic pain: Secondary | ICD-10-CM | POA: Insufficient documentation

## 2017-10-09 DIAGNOSIS — M545 Low back pain: Secondary | ICD-10-CM | POA: Insufficient documentation

## 2017-10-09 DIAGNOSIS — M6281 Muscle weakness (generalized): Secondary | ICD-10-CM | POA: Insufficient documentation

## 2017-10-09 NOTE — Therapy (Signed)
[place] MAIN [facility] SERVICES [address] [place], [place], [address] Phone: [phone]   Fax:  [phone]  Physical Therapy Treatment  Patient Details  Name: Lindsey Willis MRN: [id number] Date of Birth: [birth year] Referring Provider: Fabio Bering   Encounter Date: 10/09/2017  PT End of Session - 10/09/17 0941    Visit Number  42    Number of Visits  92    Date for PT Re-Evaluation  11/27/17    Authorization Type  2/10 progress noted start 8/27    PT Start Time  0820    PT Stop Time  0925    PT Time Calculation (min)  65 min    Activity Tolerance  Patient tolerated treatment well    Behavior During Therapy  [facility] for tasks assessed/performed       Past Medical History:  Diagnosis Date  . Anemia   . Asthma   . Borderline personality disorder ([place])   . Depression   . DVT of leg (deep venous thrombosis) ([place])   . Fibromyalgia   . May-Thurner syndrome   . PTSD (post-traumatic stress disorder)     Past Surgical History:  Procedure Laterality Date  . BREAST CYST ASPIRATION Right    neg  . CHOLECYSTECTOMY    . IVC FILTER PLACEMENT ([facility] HX)    . LEG SURGERY    . TUBAL LIGATION      There were no vitals filed for this visit.  Subjective Assessment - 10/09/17 0935    Subjective  Pt returns to pool today for aquatic sessions after muscular injury to R LB/posterior hip. Pt reports 6/10 pain in this area today and notes that she is feeling a lot better. Pt does have chronic pain in R hip, so it has returned to about baseline. Pt has work on tring to maintain core strength she had gained while on hold from PT, but is anxious to get back to progressing. Prior to injury pt was beginning to progress to more advanced exercises and initiate increased speed activities for cardiovascular as well as muscular strengthening exercises in to protected pool environment, as pt is unable to do such work on land.     Pertinent History  Pt has  had chronic low back pain for 10+ years.  Pt has hx of anxiety, asthma, chronic LBP, major depressive disorder, fibromyalgia, May-Thurner Syndrome, RLS and PTSD.  Pt has had a hx of LE DVT's and pulmonary embolism with multiple stene placements and thrombolysis in B LE's with last stent placement occuring in 2012.  Pt is currently on anticoagulants. Pt has had 2 nerve blocks for leg pain in the past resulting in decreased sensation and function of L LE.  Pt also has decreased sensation of B feet.      Enters/exits via ramp  Ambulation warm up  2 L fwd  2 L side with squat  Core/LE  Rail   Squats, 30x  2 noodles   Oblique (lateral) crunch, 2 x 10 B   Hang 3 min  Core with UE, mitts, 30x ea  Wall sit position, shoulders in water   Sh abd/add   Sh flex/ext   Sh horiz abd/add  Core walking with mitts  Fwd with thumb up lateral drag 2 L  Side with hand thumb up front of body (elbow 90 deg), 2 L  Core/LE  Bench   LE (straight leg) up and outs, 2 x 10 ea   STS, 20x  Modified planks    Fwd 1 min hold    Fwd 1 min with alternating hip abd    Fwd 1 min with alternating knee tuck    Side 1 min hold, B x 2 each  Active stretching  hip flexor/lateral trunk 4x ea B  ITB B 4 x ea  Independent whirlpool time with stretching 15 min post session                             PT Education - 10/09/17 0940    Education provided  Yes    Education Details  core focus for initial session and progression of aquatic exercises if initial session tolerated well    Person(s) Educated  Patient    Methods  Explanation    Comprehension  Verbalized understanding       PT Short Term Goals - 10/02/17 1440      PT SHORT TERM GOAL #1   Title  Pt will demonstrate improved pain levels with activity to 6/10 or <.    Baseline  Worst: 9/10, Best: 6/10, 8/10 right hip pain 2/20: worst pain 8/10; 326: worst 7/10 4/30: 7/10 8/1: : 9/10 worst pain, average pain: 7/10 ; pain worsens  with weightbearing. 8/27: 8/10    Time  4    Period  Weeks    Status  Partially Met    Target Date  10/16/17      PT SHORT TERM GOAL #2   Title  Pt will improve Mod ODI to 60% or less indicating severe disability in order to demonstrate self-reported improvements of pain and mobility.    Baseline  11/15/16: 72% indicating extreme disability, 52 % 2/20: 44%    Time  8    Period  Weeks    Status  Achieved        PT Long Term Goals - 10/02/17 1441      PT LONG TERM GOAL #1   Title  Pt will demonstrate independence with HEP in order to manage pain and other symptoms.    Baseline  Patient is in aquatic therapy and is doing her exercises outside of the pool; 5/20: pt reports she is completing her land exercises every day    Time  8    Period  Weeks    Status  Achieved      PT LONG TERM GOAL #2   Title  Pt will improve gait speed to at least 1.0 m/s in order to demonstrate improvements in LE strength and decreaed pain levels in order to ambulate within limited community ambulator status.    Baseline  11/15/16: 0.5 m/s, .36 m/sec; .45 sec 01/23/17, .41 m/sec 2/20:  .7ms 3/26: .55 m/s 4/30: .58 m/s; 5/20: .43 m/s: 08/02/17=.63 m/sec 8/27=.577m     Time  8    Period  Weeks    Status  Partially Met      PT LONG TERM GOAL #3   Title  Pt will reduce time to complete 5x sit to stand to <30 seconds in order to demonstrate improved LE strength and decreaed levels of pain, demonstrating improvement in functional mobility.    Baseline  11/15/16: 50.38 sec, 50.54 sec:01/23/17 45.33 sec, 41.66 sec 2/20: 34.8 seconds 3/26: 30 seconds; 5/20: 17.34 seconds:    Time  8    Period  Weeks    Status  Achieved      PT LONG TERM GOAL #4  Title  Pt will improve Mod ODI to 40% or < indicating moderate level of disability in order to demonstrate self-reported improvements in pain and mobility.    Baseline  11/15/16: 72% indicating extreme disability, 52 % ODI; 44% ODI, 48% indicating moderate disability;  2/20: 44% 3/26: 44% 4/30: 38% 08/02/17 = 36% 8/27: 42%    Time  8    Period  Weeks    Status  Partially Met      PT LONG TERM GOAL #5   Title  Pt will improve LE strength to at least 4/5 in all limited planes in order to improve mobility, gait, and ADL ability.    Baseline  Gross strength assessment: 3/5 - 2/5 in L hip blex and B hip ext 2/20 : pain limited MMT; 5/20: L hip F 2/5, hip abd 4/5 on R hip abd 4-/5 on L, R hip add 4/5 on R and 4-/5 on the L 8/27: 2/5 hip flexion L R 4-/5    Time  8    Period  Weeks    Status  Partially Met    Target Date  11/27/17      PT LONG TERM GOAL #6   Title  Pt will reduce time to complete 5x sit to stand to <20 seconds in order to demonstrate improved LE strength and decreaed levels of pain, demonstrating improvement in functional mobility.    Baseline  3/26: 30 seconds 4/30: 25 seconds; 5/20: 17.34 seconds, 08/02/17 = 24.40 sec; 8/27: 25 seconds    Time  8    Period  Weeks    Status  Partially Met    Target Date  11/27/17      PT LONG TERM GOAL #7   Title  Patient will reduce timed up and go to <11 seconds to reduce fall risk and demonstrate improved transfer/gait ability    Baseline  3/26: 22 seconds 4/30: 20 seconds; 5/20: 15.33 seconds; 08/02/17 = 13.40    Time  8    Period  Weeks    Status  Partially Met    Target Date  11/27/17      PT LONG TERM GOAL #8   Title  Patient will report having "bad pain" day <2x/week demonstrating improved quality of life.     Baseline  4/30: 2x/week; 5/20: pt reports having 2x/wk "bad days"; 08/02/17 this week was less than 2 x 8/1: 2x 8/27: 2x /week     Time  8    Period  Weeks    Status  Partially Met    Target Date  11/27/17            Plan - 10/09/17 0943    Clinical Impression Statement  Pt tolerated session well with focus on core strengthening/stabilization, STS strength and balance and stretching. Plan to progress to interval format for both strenthening and cardiovascular strength using  suspended working position with increased motion and speed.     Rehab Potential  Fair    Clinical Impairments Affecting Rehab Potential  age, perception of dx, comorbidities    PT Frequency  2x / week    PT Duration  8 weeks    PT Treatment/Interventions  ADLs/Self Care Home Management;Aquatic Therapy;Cryotherapy;Moist Heat;Traction;Ultrasound;Electrical Stimulation;Gait training;Stair training;Functional mobility training;Therapeutic activities;Therapeutic exercise;Balance training;Neuromuscular re-education;Patient/family education;Manual techniques;Passive range of motion;Dry needling;Taping    PT Next Visit Plan  continue aquatic therapy    PT Home Exercise Plan  abdominal bracing in hooklying     Consulted and Agree with  Plan of Care  Patient       Patient will benefit from skilled therapeutic intervention in order to improve the following deficits and impairments:  Abnormal gait, Decreased activity tolerance, Decreased endurance, Decreased mobility, Decreased range of motion, Decreased strength, Difficulty walking, Hypomobility, Increased muscle spasms, Pain, Impaired flexibility, Improper body mechanics, Postural dysfunction, Decreased balance  Visit Diagnosis: Difficulty in walking, not elsewhere classified  Muscle weakness (generalized)  Chronic right-sided low back pain, with sciatica presence unspecified  Chronic midline low back pain, with sciatica presence unspecified     Problem List Patient Active Problem List   Diagnosis Date Noted  . Deep vein thrombosis (DVT) ([place]) 04/08/2015  . May-Thurner syndrome 04/08/2015  . Overdose 02/09/2015  . Ache in joint 11/21/2013  . Arteriovenous fistula ([place]) 06/20/2013  . Absolute anemia 10/19/2012  . Recurrent major depressive disorder, in partial remission ([place]) 07/13/2012  . Chronic pain 01/22/2012  . Clinical depression 01/22/2012  . Disease of lung 01/22/2012  . Fibromyalgia 01/22/2012  . LBP (low back pain) 01/22/2012   . Cannot sleep 01/22/2012  . Emotional neurotic disorder 01/22/2012  . Cannabis abuse, continuous 01/22/2012  . Pulmonary embolism with infarction ([place]) 01/22/2012  . Esophagitis, reflux 01/22/2012  . Current tobacco use 01/22/2012  . Narcotic drug use 11/02/2010  . Post-phlebitic syndrome 07/05/2010  . Borderline personality disorder ([place]) 06/15/2010  . Chronic deep vein thrombosis (DVT) of lower extremity ([place]) 06/15/2010  . Hereditary and idiopathic neuropathy 06/15/2010  . Neurosis, posttraumatic 06/15/2010    Larae Grooms 10/09/2017, 11:00 AM  [place] MAIN [facility] SERVICES [address] [place], [place], [address] Phone: [phone]   Fax:  [phone]  Name: Lindsey Willis MRN: [id number] Date of Birth: 1976-01-04

## 2017-10-18 ENCOUNTER — Ambulatory Visit: Payer: PPO

## 2017-10-18 DIAGNOSIS — G8929 Other chronic pain: Secondary | ICD-10-CM

## 2017-10-18 DIAGNOSIS — R262 Difficulty in walking, not elsewhere classified: Secondary | ICD-10-CM

## 2017-10-18 DIAGNOSIS — M545 Low back pain: Secondary | ICD-10-CM

## 2017-10-18 DIAGNOSIS — M6281 Muscle weakness (generalized): Secondary | ICD-10-CM

## 2017-10-18 NOTE — Therapy (Signed)
[place] MAIN [facility] SERVICES [address] [place], [place], [address] Phone: [phone]   Fax:  [phone]  Physical Therapy Treatment  Patient Details  Name: Lindsey Willis MRN: [id number] Date of Birth: 09-23-1975 Referring Provider: Fabio Bering   Encounter Date: 10/18/2017  PT End of Session - 10/18/17 0859    Visit Number  43    Number of Visits  57    Date for PT Re-Evaluation  11/27/17    Authorization Type  3/10    PT Start Time  0815    PT Stop Time  0905    PT Time Calculation (min)  50 min    Activity Tolerance  Patient tolerated treatment well    Behavior During Therapy  [facility] for tasks assessed/performed       Past Medical History:  Diagnosis Date  . Anemia   . Asthma   . Borderline personality disorder ([place])   . Depression   . DVT of leg (deep venous thrombosis) ([place])   . Fibromyalgia   . May-Thurner syndrome   . PTSD (post-traumatic stress disorder)     Past Surgical History:  Procedure Laterality Date  . BREAST CYST ASPIRATION Right    neg  . CHOLECYSTECTOMY    . IVC FILTER PLACEMENT ([facility] HX)    . LEG SURGERY    . TUBAL LIGATION      There were no vitals filed for this visit.  Subjective Assessment - 10/18/17 0912    Subjective  Pt reports tolerating last pool session well; today continues at 6/10 baseline pain in R hip, but notes much better days than bad days overall with therapy, Pt reports mild R knee pain with steps and feels it is time for knee injections, which alleviates her pain. Pt also happy she has lost 10 pounds since being able to me more active    Pertinent History  Pt has had chronic low back pain for 10+ years.  Pt has hx of anxiety, asthma, chronic LBP, major depressive disorder, fibromyalgia, May-Thurner Syndrome, RLS and PTSD.  Pt has had a hx of LE DVT's and pulmonary embolism with multiple stene placements and thrombolysis in B LE's with last stent placement occuring in 2012.   Pt is currently on anticoagulants. Pt has had 2 nerve blocks for leg pain in the past resulting in decreased sensation and function of L LE.  Pt also has decreased sensation of B feet.      Ambulation, warm up  4 L fwd  4 L side  Squats, red dumbbells, 30x  Partial step up on L only, 20x  Cardio set in suspended position, 1 min ea (5 min total), no resistive arms  Jog  Jack  Ski  X jack  [employer]  2 recovery laps with backward arm drag  Core, red dumbbells  Flexion, 30x  Lateral flexion (dumbbell), 30x  Side lunges, red dumbbells, 20x ea B  Cardiovascular set at above with recovery laps   Independent stretching in hot tub                           PT Education - 10/18/17 0842    Education provided  Yes    Education Details  Re education on suspended work, adding buoyant resist to LE exercises    Person(s) Educated  Patient    Methods  Explanation;Demonstration    Comprehension  Verbalized understanding;Returned demonstration  PT Short Term Goals - 10/02/17 1440      PT SHORT TERM GOAL #1   Title  Pt will demonstrate improved pain levels with activity to 6/10 or <.    Baseline  Worst: 9/10, Best: 6/10, 8/10 right hip pain 2/20: worst pain 8/10; 326: worst 7/10 4/30: 7/10 8/1: : 9/10 worst pain, average pain: 7/10 ; pain worsens with weightbearing. 8/27: 8/10    Time  4    Period  Weeks    Status  Partially Met    Target Date  10/16/17      PT SHORT TERM GOAL #2   Title  Pt will improve Mod ODI to 60% or less indicating severe disability in order to demonstrate self-reported improvements of pain and mobility.    Baseline  11/15/16: 72% indicating extreme disability, 52 % 2/20: 44%    Time  8    Period  Weeks    Status  Achieved        PT Long Term Goals - 10/02/17 1441      PT LONG TERM GOAL #1   Title  Pt will demonstrate independence with HEP in order to manage pain and other symptoms.    Baseline  Patient is in aquatic therapy  and is doing her exercises outside of the pool; 5/20: pt reports she is completing her land exercises every day    Time  8    Period  Weeks    Status  Achieved      PT LONG TERM GOAL #2   Title  Pt will improve gait speed to at least 1.0 m/s in order to demonstrate improvements in LE strength and decreaed pain levels in order to ambulate within limited community ambulator status.    Baseline  11/15/16: 0.5 m/s, .36 m/sec; .45 sec 01/23/17, .41 m/sec 2/20:  .68ms 3/26: .55 m/s 4/30: .58 m/s; 5/20: .43 m/s: 08/02/17=.63 m/sec 8/27=.565m     Time  8    Period  Weeks    Status  Partially Met      PT LONG TERM GOAL #3   Title  Pt will reduce time to complete 5x sit to stand to <30 seconds in order to demonstrate improved LE strength and decreaed levels of pain, demonstrating improvement in functional mobility.    Baseline  11/15/16: 50.38 sec, 50.54 sec:01/23/17 45.33 sec, 41.66 sec 2/20: 34.8 seconds 3/26: 30 seconds; 5/20: 17.34 seconds:    Time  8    Period  Weeks    Status  Achieved      PT LONG TERM GOAL #4   Title  Pt will improve Mod ODI to 40% or < indicating moderate level of disability in order to demonstrate self-reported improvements in pain and mobility.    Baseline  11/15/16: 72% indicating extreme disability, 52 % ODI; 44% ODI, 48% indicating moderate disability; 2/20: 44% 3/26: 44% 4/30: 38% 08/02/17 = 36% 8/27: 42%    Time  8    Period  Weeks    Status  Partially Met      PT LONG TERM GOAL #5   Title  Pt will improve LE strength to at least 4/5 in all limited planes in order to improve mobility, gait, and ADL ability.    Baseline  Gross strength assessment: 3/5 - 2/5 in L hip blex and B hip ext 2/20 : pain limited MMT; 5/20: L hip F 2/5, hip abd 4/5 on R hip abd 4-/5 on L, R hip add  4/5 on R and 4-/5 on the L 8/27: 2/5 hip flexion L R 4-/5    Time  8    Period  Weeks    Status  Partially Met    Target Date  11/27/17      PT LONG TERM GOAL #6   Title  Pt will reduce time  to complete 5x sit to stand to <20 seconds in order to demonstrate improved LE strength and decreaed levels of pain, demonstrating improvement in functional mobility.    Baseline  3/26: 30 seconds 4/30: 25 seconds; 5/20: 17.34 seconds, 08/02/17 = 24.40 sec; 8/27: 25 seconds    Time  8    Period  Weeks    Status  Partially Met    Target Date  11/27/17      PT LONG TERM GOAL #7   Title  Patient will reduce timed up and go to <11 seconds to reduce fall risk and demonstrate improved transfer/gait ability    Baseline  3/26: 22 seconds 4/30: 20 seconds; 5/20: 15.33 seconds; 08/02/17 = 13.40    Time  8    Period  Weeks    Status  Partially Met    Target Date  11/27/17      PT LONG TERM GOAL #8   Title  Patient will report having "bad pain" day <2x/week demonstrating improved quality of life.     Baseline  4/30: 2x/week; 5/20: pt reports having 2x/wk "bad days"; 08/02/17 this week was less than 2 x 8/1: 2x 8/27: 2x /week     Time  8    Period  Weeks    Status  Partially Met    Target Date  11/27/17            Plan - 10/18/17 0902    Clinical Impression Statement  Pt tolerated interval template of strength circuits with 2 cardiovascular rounds. Able to tolerate addition of light buoyancy dumbbells to LE and core exercises. Continue to progress strength and speed work    Rehab Potential  Fair    Clinical Impairments Affecting Rehab Potential  age, perception of dx, comorbidities    PT Frequency  2x / week    PT Duration  8 weeks    PT Treatment/Interventions  ADLs/Self Care Home Management;Aquatic Therapy;Cryotherapy;Moist Heat;Traction;Ultrasound;Electrical Stimulation;Gait training;Stair training;Functional mobility training;Therapeutic activities;Therapeutic exercise;Balance training;Neuromuscular re-education;Patient/family education;Manual techniques;Passive range of motion;Dry needling;Taping    PT Next Visit Plan  continue aquatic therapy    PT Home Exercise Plan  abdominal bracing in  hooklying     Consulted and Agree with Plan of Care  Patient       Patient will benefit from skilled therapeutic intervention in order to improve the following deficits and impairments:  Abnormal gait, Decreased activity tolerance, Decreased endurance, Decreased mobility, Decreased range of motion, Decreased strength, Difficulty walking, Hypomobility, Increased muscle spasms, Pain, Impaired flexibility, Improper body mechanics, Postural dysfunction, Decreased balance  Visit Diagnosis: Difficulty in walking, not elsewhere classified  Muscle weakness (generalized)  Chronic right-sided low back pain, with sciatica presence unspecified  Chronic midline low back pain, with sciatica presence unspecified     Problem List Patient Active Problem List   Diagnosis Date Noted  . Deep vein thrombosis (DVT) ([place]) 04/08/2015  . May-Thurner syndrome 04/08/2015  . Overdose 02/09/2015  . Ache in joint 11/21/2013  . Arteriovenous fistula ([place]) 06/20/2013  . Absolute anemia 10/19/2012  . Recurrent major depressive disorder, in partial remission ([place]) 07/13/2012  . Chronic pain 01/22/2012  .  Clinical depression 01/22/2012  . Disease of lung 01/22/2012  . Fibromyalgia 01/22/2012  . LBP (low back pain) 01/22/2012  . Cannot sleep 01/22/2012  . Emotional neurotic disorder 01/22/2012  . Cannabis abuse, continuous 01/22/2012  . Pulmonary embolism with infarction ([place]) 01/22/2012  . Esophagitis, reflux 01/22/2012  . Current tobacco use 01/22/2012  . Narcotic drug use 11/02/2010  . Post-phlebitic syndrome 07/05/2010  . Borderline personality disorder ([place]) 06/15/2010  . Chronic deep vein thrombosis (DVT) of lower extremity ([place]) 06/15/2010  . Hereditary and idiopathic neuropathy 06/15/2010  . Neurosis, posttraumatic 06/15/2010    Larae Grooms 10/18/2017, 9:16 AM  [place] MAIN [facility] SERVICES [address] [place], [place], [address] Phone: [phone]    Fax:  [phone]  Name: BONNEE ZERTUCHE MRN: [id number] Date of Birth: [birth year]

## 2017-10-22 DIAGNOSIS — M2391 Unspecified internal derangement of right knee: Secondary | ICD-10-CM | POA: Diagnosis not present

## 2017-10-22 DIAGNOSIS — G8929 Other chronic pain: Secondary | ICD-10-CM | POA: Diagnosis not present

## 2017-10-22 DIAGNOSIS — M25561 Pain in right knee: Secondary | ICD-10-CM | POA: Diagnosis not present

## 2017-10-22 DIAGNOSIS — M1711 Unilateral primary osteoarthritis, right knee: Secondary | ICD-10-CM | POA: Diagnosis not present

## 2017-10-25 ENCOUNTER — Ambulatory Visit: Payer: PPO

## 2017-10-31 ENCOUNTER — Ambulatory Visit: Payer: PPO

## 2017-11-01 ENCOUNTER — Ambulatory Visit: Payer: PPO

## 2017-11-01 DIAGNOSIS — R262 Difficulty in walking, not elsewhere classified: Secondary | ICD-10-CM | POA: Diagnosis not present

## 2017-11-01 DIAGNOSIS — G8929 Other chronic pain: Secondary | ICD-10-CM

## 2017-11-01 DIAGNOSIS — M6281 Muscle weakness (generalized): Secondary | ICD-10-CM

## 2017-11-01 DIAGNOSIS — M545 Low back pain: Secondary | ICD-10-CM

## 2017-11-01 NOTE — Therapy (Signed)
[place] MAIN [facility] SERVICES [address] [place], [place], [address] Phone: [phone]   Fax:  [phone]  Physical Therapy Treatment Physical Therapy Progress Note   Dates of reporting period  10/02/17   to   11/01/17   Patient Details  Name: Lindsey Willis MRN: [id number] Date of Birth: Mar 16, 1975 Referring Provider (PT): Fabio Bering   Encounter Date: 11/01/2017  PT End of Session - 11/01/17 1442    Visit Number  44    Number of Visits  60    Date for PT Re-Evaluation  12/27/17    Authorization Type  4/10; next 1/10 PN start 9/26    PT Start Time  1352    PT Stop Time  1430    PT Time Calculation (min)  38 min    Activity Tolerance  Patient tolerated treatment well    Behavior During Therapy  [facility] for tasks assessed/performed       Past Medical History:  Diagnosis Date  . Anemia   . Asthma   . Borderline personality disorder ([place])   . Depression   . DVT of leg (deep venous thrombosis) ([place])   . Fibromyalgia   . May-Thurner syndrome   . PTSD (post-traumatic stress disorder)     Past Surgical History:  Procedure Laterality Date  . BREAST CYST ASPIRATION Right    neg  . CHOLECYSTECTOMY    . IVC FILTER PLACEMENT ([facility] HX)    . LEG SURGERY    . TUBAL LIGATION      There were no vitals filed for this visit.  Subjective Assessment - 11/01/17 1437    Subjective  Patient reports her car motor has broken so has been having some transportation issues. Patient arrived late to session due to difficulty with transportation. Went to the gym this morning with her sister. Has been compliant with HEP and been active between sessions. Has been completing her stretching frequently which is helping with her back. Has enrolled her granddaughter in cheerleading and is now more active that her granddaughter lives with her.     Pertinent History  Pt has had chronic low back pain for 10+ years.  Pt has hx of anxiety, asthma, chronic  LBP, major depressive disorder, fibromyalgia, May-Thurner Syndrome, RLS and PTSD.  Pt has had a hx of LE DVT's and pulmonary embolism with multiple stene placements and thrombolysis in B LE's with last stent placement occuring in 2012.  Pt is currently on anticoagulants. Pt has had 2 nerve blocks for leg pain in the past resulting in decreased sensation and function of L LE.  Pt also has decreased sensation of B feet.    Limitations  Sitting;Walking;Writing;Lifting;House hold activities;Standing    Patient Stated Goals  continue reducing pain and move better.     Currently in Pain?  Yes    Pain Score  6     Pain Location  Back    Pain Orientation  Lower    Pain Descriptors / Indicators  Aching    Pain Type  Chronic pain    Pain Onset  More than a month ago    Pain Frequency  Intermittent      VAS: patient reports pain from fall is better now, has it occasionally when first wakes up but better throughout the day.  Current VAS 6/10. Went to the gym this morning with sister. Worst VAS: 7/10  10 MWT  13 seconds= .77 m/s MODI= 50% LE strength  TUG : 14 seconds 5xSTS: 22 seconds Days of bad pain : 1x/ week    Treat: Supine:   Hamstring stretch 60 seconds each leg, popliteal angle 60 seconds each leg  Short arc distraction with belt 5x15 second inferior holds, 5x15 seconds lateral holds, bilateral LE  Long arc distraction with belt, 5x 15 second holds each LE  Patient educated on home program, need for continued stretching, how to stretch hamstring with concurrent education on anatomy of hamstring and connection of hamstring with back pain.     Patient's condition has the potential to improve in response to therapy. Maximum improvement is yet to be obtained. The anticipated improvement is attainable and reasonable in a generally predictable time.  Patient reports her pain is not as severe and not as frequent anymore. She is able to perform more tasks.   Patient wants to eventually transition  to independent home/pool program, feels like she is getting closer but not quite there yet.                    PT Education - 11/01/17 1439    Education provided  Yes    Education Details  goal progression, manual, hamstring lengthening importance    Person(s) Educated  Patient    Methods  Explanation;Demonstration;Verbal cues    Comprehension  Verbalized understanding;Returned demonstration       PT Short Term Goals - 11/01/17 1401      PT SHORT TERM GOAL #1   Title  Pt will demonstrate improved pain levels with activity to 6/10 or <.    Baseline  Worst: 9/10, Best: 6/10, 8/10 right hip pain 2/20: worst pain 8/10; 326: worst 7/10 4/30: 7/10 8/1: : 9/10 worst pain, average pain: 7/10 ; pain worsens with weightbearing. 8/27: 8/10 9/26: 7/10     Time  4    Period  Weeks    Status  Partially Met      PT SHORT TERM GOAL #2   Title  Pt will improve Mod ODI to 60% or less indicating severe disability in order to demonstrate self-reported improvements of pain and mobility.    Baseline  11/15/16: 72% indicating extreme disability, 52 % 2/20: 44%    Time  8    Period  Weeks    Status  Achieved        PT Long Term Goals - 11/01/17 1403      PT LONG TERM GOAL #1   Title  Pt will demonstrate independence with HEP in order to manage pain and other symptoms.    Baseline  Patient is in aquatic therapy and is doing her exercises outside of the pool; 5/20: pt reports she is completing her land exercises every day    Time  8    Period  Weeks    Status  Achieved      PT LONG TERM GOAL #2   Title  Pt will improve gait speed to at least 1.0 m/s in order to demonstrate improvements in LE strength and decreaed pain levels in order to ambulate within limited community ambulator status.    Baseline  11/15/16: 0.5 m/s, .36 m/sec; .45 sec 01/23/17, .41 m/sec 2/20:  .52ms 3/26: .55 m/s 4/30: .58 m/s; 5/20: .43 m/s: 08/02/17=.63 m/sec 8/27=.541m 9/26: .77 m/s     Time  8    Period   Weeks    Status  Partially Met    Target Date  12/27/17      PT  LONG TERM GOAL #3   Title  Pt will reduce time to complete 5x sit to stand to <30 seconds in order to demonstrate improved LE strength and decreaed levels of pain, demonstrating improvement in functional mobility.    Baseline  11/15/16: 50.38 sec, 50.54 sec:01/23/17 45.33 sec, 41.66 sec 2/20: 34.8 seconds 3/26: 30 seconds; 5/20: 17.34 seconds:    Time  8    Period  Weeks    Status  Achieved      PT LONG TERM GOAL #4   Title  Pt will improve Mod ODI to 40% or < indicating moderate level of disability in order to demonstrate self-reported improvements in pain and mobility.    Baseline  11/15/16: 72% indicating extreme disability, 52 % ODI; 44% ODI, 48% indicating moderate disability; 2/20: 44% 3/26: 44% 4/30: 38% 08/02/17 = 36% 8/27: 42% 9/26: 50%    Time  8    Period  Weeks    Status  Partially Met    Target Date  12/27/17      PT LONG TERM GOAL #5   Title  Pt will improve LE strength to at least 4/5 in all limited planes in order to improve mobility, gait, and ADL ability.    Baseline  Gross strength assessment: 3/5 - 2/5 in L hip blex and B hip ext 2/20 : pain limited MMT; 5/20: L hip F 2/5, hip abd 4/5 on R hip abd 4-/5 on L, R hip add 4/5 on R and 4-/5 on the L 8/27: 2/5 hip flexion L R 4-/5    Time  8    Period  Weeks    Status  Partially Met    Target Date  12/27/17      PT LONG TERM GOAL #6   Title  Pt will reduce time to complete 5x sit to stand to <20 seconds in order to demonstrate improved LE strength and decreaed levels of pain, demonstrating improvement in functional mobility.    Baseline  3/26: 30 seconds 4/30: 25 seconds; 5/20: 17.34 seconds, 08/02/17 = 24.40 sec; 8/27: 25 seconds 9/26: 22 seconds     Time  8    Period  Weeks    Status  Partially Met    Target Date  12/27/17      PT LONG TERM GOAL #7   Title  Patient will reduce timed up and go to <11 seconds to reduce fall risk and demonstrate improved  transfer/gait ability    Baseline  3/26: 22 seconds 4/30: 20 seconds; 5/20: 15.33 seconds; 08/02/17 = 13.40 9/26: 14 seconds    Time  8    Period  Weeks    Status  Partially Met    Target Date  12/27/17      PT LONG TERM GOAL #8   Title  Patient will report having "bad pain" day <2x/week demonstrating improved quality of life.     Baseline  4/30: 2x/week; 5/20: pt reports having 2x/wk "bad days"; 08/02/17 this week was less than 2 x 8/1: 2x 8/27: 2x /week  9/26: 1x/week    Time  8    Period  Weeks    Status  Achieved            Plan - 11/01/17 1445    Clinical Impression Statement  Patient's left leg continues to be limited body brain connection with limited ability to raise it upon command.  Patient wants to eventually transition to independent home/pool program, feels like she is getting  closer but not quite there yet. Patient is progressing with increased velocity of ambulation to .62ms and 5x STS 22 seconds with decreased days of bad pain (1x/week now). Patient's condition has the potential to improve in response to therapy. Maximum improvement is yet to be obtained. The anticipated improvement is attainable and reasonable in a generally predictable time.  Patient will continue to benefit from aquatic therapy for gravity eliminated and pain free strengthening, stabilization, and mobility to allow patient to improve quality of life.    Rehab Potential  Fair    Clinical Impairments Affecting Rehab Potential  age, perception of dx, comorbidities    PT Frequency  2x / week    PT Duration  8 weeks    PT Treatment/Interventions  ADLs/Self Care Home Management;Aquatic Therapy;Cryotherapy;Moist Heat;Traction;Ultrasound;Electrical Stimulation;Gait training;Stair training;Functional mobility training;Therapeutic activities;Therapeutic exercise;Balance training;Neuromuscular re-education;Patient/family education;Manual techniques;Passive range of motion;Dry needling;Taping    PT Next Visit Plan   continue aquatic therapy    PT Home Exercise Plan  abdominal bracing in hooklying     Consulted and Agree with Plan of Care  Patient       Patient will benefit from skilled therapeutic intervention in order to improve the following deficits and impairments:  Abnormal gait, Decreased activity tolerance, Decreased endurance, Decreased mobility, Decreased range of motion, Decreased strength, Difficulty walking, Hypomobility, Increased muscle spasms, Pain, Impaired flexibility, Improper body mechanics, Postural dysfunction, Decreased balance  Visit Diagnosis: Difficulty in walking, not elsewhere classified - Plan: PT plan of care cert/re-cert  Muscle weakness (generalized) - Plan: PT plan of care cert/re-cert  Chronic right-sided low back pain, with sciatica presence unspecified - Plan: PT plan of care cert/re-cert  Chronic midline low back pain, with sciatica presence unspecified - Plan: PT plan of care cert/re-cert     Problem List Patient Active Problem List   Diagnosis Date Noted  . [place] (DVT) ([place] 04/08/2015  . May-Thurner syndrome 04/08/2015  . Overdose 02/09/2015  . Ache in joint 11/21/2013  . Arteriovenous fistula ([place] 06/20/2013  . Absolute anemia 10/19/2012  . Recurrent major depressive disorder, in partial remission ([place] 07/13/2012  . Chronic pain 01/22/2012  . Clinical depression 01/22/2012  . Disease of lung 01/22/2012  . Fibromyalgia 01/22/2012  . LBP (low back pain) 01/22/2012  . Cannot sleep 01/22/2012  . Emotional neurotic disorder 01/22/2012  . Cannabis abuse, continuous 01/22/2012  . Pulmonary embolism with infarction ([place] 01/22/2012  . Esophagitis, reflux 01/22/2012  . Current tobacco use 01/22/2012  . Narcotic drug use 11/02/2010  . Post-phlebitic syndrome 07/05/2010  . Borderline personality disorder ([place] 06/15/2010  . Chronic deep vein thrombosis (DVT) of lower extremity ([place] 06/15/2010  . Hereditary and idiopathic neuropathy  06/15/2010  . Neurosis, posttraumatic 06/15/2010   MJanna Arch PT, DPT   11/01/2017, 2:51 PM  [place] [facility] [address] [address]Phone: [phone]  Fax:  [phone] Name: Lindsey STOFFELMRN: [id number] of Birth: 618-May-1977

## 2017-11-08 ENCOUNTER — Other Ambulatory Visit: Payer: Self-pay

## 2017-11-08 ENCOUNTER — Ambulatory Visit: Payer: PPO

## 2017-11-08 DIAGNOSIS — M6281 Muscle weakness (generalized): Secondary | ICD-10-CM | POA: Diagnosis not present

## 2017-11-08 DIAGNOSIS — R262 Difficulty in walking, not elsewhere classified: Secondary | ICD-10-CM

## 2017-11-08 NOTE — Therapy (Signed)
[place] MAIN [facility] SERVICES [address] [place], [place], [address] Phone: [phone]   Fax:  [phone]  Physical Therapy Treatment  Patient Details  Name: Lindsey Willis MRN: [id number] Date of Birth: 02-21-1975 Referring Provider (PT): Fabio Bering   Encounter Date: 11/08/2017  PT End of Session - 11/08/17 1228    Visit Number  45    Number of Visits  60    Date for PT Re-Evaluation  12/27/17    Authorization Type  5/10; next PN start 9/26    PT Start Time  1020    PT Stop Time  1120    PT Time Calculation (min)  60 min    Activity Tolerance  Patient tolerated treatment well    Behavior During Therapy  [facility] for tasks assessed/performed       Past Medical History:  Diagnosis Date  . Anemia   . Asthma   . Borderline personality disorder ([place])   . Depression   . DVT of leg (deep venous thrombosis) ([place])   . Fibromyalgia   . May-Thurner syndrome   . PTSD (post-traumatic stress disorder)     Past Surgical History:  Procedure Laterality Date  . BREAST CYST ASPIRATION Right    neg  . CHOLECYSTECTOMY    . IVC FILTER PLACEMENT ([facility] HX)    . LEG SURGERY    . TUBAL LIGATION      There were no vitals filed for this visit.  Subjective Assessment - 11/08/17 1225    Subjective  No new voiced complaints; pain in R hip/back remains at a baseline of 6. Pt notes she is tolerating a lot more activity and overall feels much better.     Pertinent History  Pt has had chronic low back pain for 10+ years.  Pt has hx of anxiety, asthma, chronic LBP, major depressive disorder, fibromyalgia, May-Thurner Syndrome, RLS and PTSD.  Pt has had a hx of LE DVT's and pulmonary embolism with multiple stene placements and thrombolysis in B LE's with last stent placement occuring in 2012.  Pt is currently on anticoagulants. Pt has had 2 nerve blocks for leg pain in the past resulting in decreased sensation and function of L LE.  Pt also has  decreased sensation of B feet.      Enters exits via steps   Ambulation  2 L fwd warm up speed  2 L fwd increased speed with UE slice through water  2 L side step warm up speed  2 L side step increased speed with minisquat and UE slice through water  Resisted squat with super noodle, 30x  Core with LE strength  2# B ankle wts   Single leg standing bike, B; fwd/bkwd 20x ea   Mod plank at bench    B jack, 30 sec, 15 sec rest, 30 sec mountain climber, 15 sed rest. 3 rounds   Seated at bench    B up and outs,  X 10 ea  Abdominals  Supernoodle   Fall to recover fwd and B side, 3 L ea  Mitts, 30x ea   Sh abd/add   Sh flex/ext   Sh horiz abd/add  Reviewed stretches   Independent stretching in hot tub 15 min                         PT Education - 11/08/17 1226    Education Details  Increased speed with ambulation and  use of arms, Standing resisted SL bike fwd/bkwd.Terence Lux) Educated  Patient    Methods  Explanation;Demonstration    Comprehension  Verbalized understanding;Returned demonstration;Verbal cues required       PT Short Term Goals - 11/01/17 1401      PT SHORT TERM GOAL #1   Title  Pt will demonstrate improved pain levels with activity to 6/10 or <.    Baseline  Worst: 9/10, Best: 6/10, 8/10 right hip pain 2/20: worst pain 8/10; 326: worst 7/10 4/30: 7/10 8/1: : 9/10 worst pain, average pain: 7/10 ; pain worsens with weightbearing. 8/27: 8/10 9/26: 7/10     Time  4    Period  Weeks    Status  Partially Met      PT SHORT TERM GOAL #2   Title  Pt will improve Mod ODI to 60% or less indicating severe disability in order to demonstrate self-reported improvements of pain and mobility.    Baseline  11/15/16: 72% indicating extreme disability, 52 % 2/20: 44%    Time  8    Period  Weeks    Status  Achieved        PT Long Term Goals - 11/01/17 1403      PT LONG TERM GOAL #1   Title  Pt will demonstrate independence with HEP in order  to manage pain and other symptoms.    Baseline  Patient is in aquatic therapy and is doing her exercises outside of the pool; 5/20: pt reports she is completing her land exercises every day    Time  8    Period  Weeks    Status  Achieved      PT LONG TERM GOAL #2   Title  Pt will improve gait speed to at least 1.0 m/s in order to demonstrate improvements in LE strength and decreaed pain levels in order to ambulate within limited community ambulator status.    Baseline  11/15/16: 0.5 m/s, .36 m/sec; .45 sec 01/23/17, .41 m/sec 2/20:  .96ms 3/26: .55 m/s 4/30: .58 m/s; 5/20: .43 m/s: 08/02/17=.63 m/sec 8/27=.548m 9/26: .77 m/s     Time  8    Period  Weeks    Status  Partially Met    Target Date  12/27/17      PT LONG TERM GOAL #3   Title  Pt will reduce time to complete 5x sit to stand to <30 seconds in order to demonstrate improved LE strength and decreaed levels of pain, demonstrating improvement in functional mobility.    Baseline  11/15/16: 50.38 sec, 50.54 sec:01/23/17 45.33 sec, 41.66 sec 2/20: 34.8 seconds 3/26: 30 seconds; 5/20: 17.34 seconds:    Time  8    Period  Weeks    Status  Achieved      PT LONG TERM GOAL #4   Title  Pt will improve Mod ODI to 40% or < indicating moderate level of disability in order to demonstrate self-reported improvements in pain and mobility.    Baseline  11/15/16: 72% indicating extreme disability, 52 % ODI; 44% ODI, 48% indicating moderate disability; 2/20: 44% 3/26: 44% 4/30: 38% 08/02/17 = 36% 8/27: 42% 9/26: 50%    Time  8    Period  Weeks    Status  Partially Met    Target Date  12/27/17      PT LONG TERM GOAL #5   Title  Pt will improve LE strength to at least 4/5 in all  limited planes in order to improve mobility, gait, and ADL ability.    Baseline  Gross strength assessment: 3/5 - 2/5 in L hip blex and B hip ext 2/20 : pain limited MMT; 5/20: L hip F 2/5, hip abd 4/5 on R hip abd 4-/5 on L, R hip add 4/5 on R and 4-/5 on the L 8/27: 2/5 hip  flexion L R 4-/5    Time  8    Period  Weeks    Status  Partially Met    Target Date  12/27/17      PT LONG TERM GOAL #6   Title  Pt will reduce time to complete 5x sit to stand to <20 seconds in order to demonstrate improved LE strength and decreaed levels of pain, demonstrating improvement in functional mobility.    Baseline  3/26: 30 seconds 4/30: 25 seconds; 5/20: 17.34 seconds, 08/02/17 = 24.40 sec; 8/27: 25 seconds 9/26: 22 seconds     Time  8    Period  Weeks    Status  Partially Met    Target Date  12/27/17      PT LONG TERM GOAL #7   Title  Patient will reduce timed up and go to <11 seconds to reduce fall risk and demonstrate improved transfer/gait ability    Baseline  3/26: 22 seconds 4/30: 20 seconds; 5/20: 15.33 seconds; 08/02/17 = 13.40 9/26: 14 seconds    Time  8    Period  Weeks    Status  Partially Met    Target Date  12/27/17      PT LONG TERM GOAL #8   Title  Patient will report having "bad pain" day <2x/week demonstrating improved quality of life.     Baseline  4/30: 2x/week; 5/20: pt reports having 2x/wk "bad days"; 08/02/17 this week was less than 2 x 8/1: 2x 8/27: 2x /week  9/26: 1x/week    Time  8    Period  Weeks    Status  Achieved            Plan - 11/08/17 1229    Clinical Impression Statement  Pt tolerated increases throughout session with increased speed and UE use with ambulation. Weighted LE and core work in seated, stand and Mod high plank. Increaesd repetitions with fall to recover abdominal work. Minimal rest throughout session. Encouraged some additional stretching today/tomorrow. Pt does require additional verbal cueing with standing SL bicycling for improved stabilization through core and hip to protect back. Continue to progress aquatic activitiy for higher level, total body strengthening activities and increased speed work.     Rehab Potential  Fair    Clinical Impairments Affecting Rehab Potential  age, perception of dx, comorbidities    PT  Frequency  2x / week    PT Duration  8 weeks    PT Treatment/Interventions  ADLs/Self Care Home Management;Aquatic Therapy;Cryotherapy;Moist Heat;Traction;Ultrasound;Electrical Stimulation;Gait training;Stair training;Functional mobility training;Therapeutic activities;Therapeutic exercise;Balance training;Neuromuscular re-education;Patient/family education;Manual techniques;Passive range of motion;Dry needling;Taping    PT Next Visit Plan  continue aquatic therapy    PT Home Exercise Plan  abdominal bracing in hooklying     Consulted and Agree with Plan of Care  Patient       Patient will benefit from skilled therapeutic intervention in order to improve the following deficits and impairments:  Abnormal gait, Decreased activity tolerance, Decreased endurance, Decreased mobility, Decreased range of motion, Decreased strength, Difficulty walking, Hypomobility, Increased muscle spasms, Pain, Impaired flexibility, Improper body mechanics, Postural dysfunction,  Decreased balance  Visit Diagnosis: Difficulty in walking, not elsewhere classified  Muscle weakness (generalized)     Problem List Patient Active Problem List   Diagnosis Date Noted  . Deep vein thrombosis (DVT) ([place]) 04/08/2015  . May-Thurner syndrome 04/08/2015  . Overdose 02/09/2015  . Ache in joint 11/21/2013  . Arteriovenous fistula ([place]) 06/20/2013  . Absolute anemia 10/19/2012  . Recurrent major depressive disorder, in partial remission ([place]) 07/13/2012  . Chronic pain 01/22/2012  . Clinical depression 01/22/2012  . Disease of lung 01/22/2012  . Fibromyalgia 01/22/2012  . LBP (low back pain) 01/22/2012  . Cannot sleep 01/22/2012  . Emotional neurotic disorder 01/22/2012  . Cannabis abuse, continuous 01/22/2012  . Pulmonary embolism with infarction ([place]) 01/22/2012  . Esophagitis, reflux 01/22/2012  . Current tobacco use 01/22/2012  . Narcotic drug use 11/02/2010  . Post-phlebitic syndrome 07/05/2010  . Borderline  personality disorder ([place]) 06/15/2010  . Chronic deep vein thrombosis (DVT) of lower extremity ([place]) 06/15/2010  . Hereditary and idiopathic neuropathy 06/15/2010  . Neurosis, posttraumatic 06/15/2010    Larae Grooms 11/08/2017, 12:34 PM  [place] MAIN [facility] SERVICES [address] [place], [place], [address] Phone: [phone]   Fax:  [phone]  Name: Lindsey Willis MRN: [id number] Date of Birth: 22-Oct-1975

## 2017-11-15 ENCOUNTER — Ambulatory Visit: Payer: PPO | Attending: Psychiatry

## 2017-11-22 ENCOUNTER — Ambulatory Visit: Payer: PPO

## 2017-11-29 ENCOUNTER — Ambulatory Visit: Payer: PPO

## 2017-12-04 ENCOUNTER — Ambulatory Visit: Payer: PPO

## 2017-12-11 ENCOUNTER — Ambulatory Visit: Payer: PPO

## 2017-12-18 ENCOUNTER — Ambulatory Visit: Payer: PPO

## 2018-01-13 DIAGNOSIS — Z9049 Acquired absence of other specified parts of digestive tract: Secondary | ICD-10-CM | POA: Diagnosis not present

## 2018-01-13 DIAGNOSIS — G8929 Other chronic pain: Secondary | ICD-10-CM | POA: Diagnosis not present

## 2018-01-13 DIAGNOSIS — Z79899 Other long term (current) drug therapy: Secondary | ICD-10-CM | POA: Diagnosis not present

## 2018-01-13 DIAGNOSIS — M25559 Pain in unspecified hip: Secondary | ICD-10-CM | POA: Diagnosis not present

## 2018-01-13 DIAGNOSIS — E340 Carcinoid syndrome: Secondary | ICD-10-CM | POA: Diagnosis not present

## 2018-01-13 DIAGNOSIS — F4310 Post-traumatic stress disorder, unspecified: Secondary | ICD-10-CM | POA: Diagnosis not present

## 2018-01-13 DIAGNOSIS — G629 Polyneuropathy, unspecified: Secondary | ICD-10-CM | POA: Diagnosis not present

## 2018-01-13 DIAGNOSIS — M5431 Sciatica, right side: Secondary | ICD-10-CM | POA: Diagnosis not present

## 2018-01-13 DIAGNOSIS — F17210 Nicotine dependence, cigarettes, uncomplicated: Secondary | ICD-10-CM | POA: Diagnosis not present

## 2018-01-13 DIAGNOSIS — M797 Fibromyalgia: Secondary | ICD-10-CM | POA: Diagnosis not present

## 2018-01-13 DIAGNOSIS — M160 Bilateral primary osteoarthritis of hip: Secondary | ICD-10-CM | POA: Diagnosis not present

## 2018-01-13 DIAGNOSIS — I82402 Acute embolism and thrombosis of unspecified deep veins of left lower extremity: Secondary | ICD-10-CM | POA: Diagnosis not present

## 2018-01-13 DIAGNOSIS — Z7901 Long term (current) use of anticoagulants: Secondary | ICD-10-CM | POA: Diagnosis not present

## 2018-01-13 DIAGNOSIS — M25552 Pain in left hip: Secondary | ICD-10-CM | POA: Diagnosis not present

## 2018-01-13 DIAGNOSIS — F329 Major depressive disorder, single episode, unspecified: Secondary | ICD-10-CM | POA: Diagnosis not present

## 2018-06-24 DIAGNOSIS — I87009 Postthrombotic syndrome without complications of unspecified extremity: Secondary | ICD-10-CM | POA: Diagnosis not present

## 2018-06-24 DIAGNOSIS — Z720 Tobacco use: Secondary | ICD-10-CM | POA: Diagnosis not present

## 2018-06-24 DIAGNOSIS — I82220 Acute embolism and thrombosis of inferior vena cava: Secondary | ICD-10-CM | POA: Diagnosis not present

## 2018-06-24 DIAGNOSIS — Z7901 Long term (current) use of anticoagulants: Secondary | ICD-10-CM | POA: Diagnosis not present

## 2018-06-24 DIAGNOSIS — I872 Venous insufficiency (chronic) (peripheral): Secondary | ICD-10-CM | POA: Diagnosis not present

## 2018-08-06 DIAGNOSIS — J302 Other seasonal allergic rhinitis: Secondary | ICD-10-CM | POA: Diagnosis not present

## 2018-08-06 DIAGNOSIS — I2699 Other pulmonary embolism without acute cor pulmonale: Secondary | ICD-10-CM | POA: Diagnosis not present

## 2018-08-06 DIAGNOSIS — Z0000 Encounter for general adult medical examination without abnormal findings: Secondary | ICD-10-CM | POA: Diagnosis not present

## 2018-08-06 DIAGNOSIS — M5442 Lumbago with sciatica, left side: Secondary | ICD-10-CM | POA: Diagnosis not present

## 2018-08-06 DIAGNOSIS — G2581 Restless legs syndrome: Secondary | ICD-10-CM | POA: Diagnosis not present

## 2018-08-06 DIAGNOSIS — Z1159 Encounter for screening for other viral diseases: Secondary | ICD-10-CM | POA: Diagnosis not present

## 2018-08-06 DIAGNOSIS — Z1239 Encounter for other screening for malignant neoplasm of breast: Secondary | ICD-10-CM | POA: Diagnosis not present

## 2018-08-06 DIAGNOSIS — F418 Other specified anxiety disorders: Secondary | ICD-10-CM | POA: Diagnosis not present

## 2018-08-06 DIAGNOSIS — Z01419 Encounter for gynecological examination (general) (routine) without abnormal findings: Secondary | ICD-10-CM | POA: Diagnosis not present

## 2018-08-06 DIAGNOSIS — J4540 Moderate persistent asthma, uncomplicated: Secondary | ICD-10-CM | POA: Diagnosis not present

## 2018-08-06 DIAGNOSIS — M79672 Pain in left foot: Secondary | ICD-10-CM | POA: Diagnosis not present

## 2018-08-06 DIAGNOSIS — G8929 Other chronic pain: Secondary | ICD-10-CM | POA: Diagnosis not present

## 2018-08-06 DIAGNOSIS — Z7901 Long term (current) use of anticoagulants: Secondary | ICD-10-CM | POA: Diagnosis not present

## 2018-08-06 NOTE — Therapy (Signed)
[place] MAIN [facility] SERVICES [address] [place], [place], [address] Phone: [phone]   Fax:  [phone]  Patient Details  Name: Lindsey Willis MRN: [id number] Date of Birth: 1975-12-02 Referring Provider:  Thayer Headings, PMHNP  Encounter Date: 04/12/2017   Chart opened in error by Lindsey Willis, Lindsey Willis.  Note ran to remove chart from open chart list.    Gavino Fouch 08/06/2018, 9:06 AM  [place], [place], [address] Phone: [phone]   Fax:  [phone]

## 2018-08-08 ENCOUNTER — Other Ambulatory Visit: Payer: Self-pay | Admitting: Family Medicine

## 2018-08-08 DIAGNOSIS — Z1231 Encounter for screening mammogram for malignant neoplasm of breast: Secondary | ICD-10-CM

## 2018-08-14 DIAGNOSIS — M5442 Lumbago with sciatica, left side: Secondary | ICD-10-CM | POA: Diagnosis not present

## 2018-08-14 DIAGNOSIS — G8929 Other chronic pain: Secondary | ICD-10-CM | POA: Diagnosis not present

## 2018-08-14 DIAGNOSIS — F419 Anxiety disorder, unspecified: Secondary | ICD-10-CM | POA: Diagnosis not present

## 2018-08-14 DIAGNOSIS — J4540 Moderate persistent asthma, uncomplicated: Secondary | ICD-10-CM | POA: Diagnosis not present

## 2018-08-14 DIAGNOSIS — L720 Epidermal cyst: Secondary | ICD-10-CM | POA: Diagnosis not present

## 2018-08-14 DIAGNOSIS — F418 Other specified anxiety disorders: Secondary | ICD-10-CM | POA: Diagnosis not present

## 2018-08-16 DIAGNOSIS — S90122A Contusion of left lesser toe(s) without damage to nail, initial encounter: Secondary | ICD-10-CM | POA: Diagnosis not present

## 2018-08-16 DIAGNOSIS — M79672 Pain in left foot: Secondary | ICD-10-CM | POA: Diagnosis not present

## 2018-08-16 DIAGNOSIS — T85848A Pain due to other internal prosthetic devices, implants and grafts, initial encounter: Secondary | ICD-10-CM | POA: Diagnosis not present

## 2018-08-16 DIAGNOSIS — S9032XA Contusion of left foot, initial encounter: Secondary | ICD-10-CM | POA: Diagnosis not present

## 2018-08-26 DIAGNOSIS — L720 Epidermal cyst: Secondary | ICD-10-CM | POA: Diagnosis not present

## 2018-08-29 DIAGNOSIS — T85848A Pain due to other internal prosthetic devices, implants and grafts, initial encounter: Secondary | ICD-10-CM | POA: Diagnosis not present

## 2018-09-11 ENCOUNTER — Other Ambulatory Visit: Payer: Self-pay | Admitting: Orthopedic Surgery

## 2018-09-11 DIAGNOSIS — G8929 Other chronic pain: Secondary | ICD-10-CM

## 2018-09-11 DIAGNOSIS — M238X1 Other internal derangements of right knee: Secondary | ICD-10-CM | POA: Diagnosis not present

## 2018-09-11 DIAGNOSIS — M25561 Pain in right knee: Secondary | ICD-10-CM | POA: Diagnosis not present

## 2018-09-23 ENCOUNTER — Other Ambulatory Visit: Payer: Self-pay

## 2018-09-23 ENCOUNTER — Ambulatory Visit
Admission: RE | Admit: 2018-09-23 | Discharge: 2018-09-23 | Disposition: A | Discharge status: Home / Self Care | Payer: Medicare HMO | Source: Ambulatory Visit | Attending: Orthopedic Surgery | Admitting: Orthopedic Surgery

## 2018-09-23 DIAGNOSIS — M25561 Pain in right knee: Secondary | ICD-10-CM | POA: Insufficient documentation

## 2018-09-23 DIAGNOSIS — G8929 Other chronic pain: Secondary | ICD-10-CM | POA: Diagnosis not present

## 2018-09-30 ENCOUNTER — Other Ambulatory Visit: Payer: Self-pay

## 2018-09-30 ENCOUNTER — Ambulatory Visit
Admission: RE | Admit: 2018-09-30 | Discharge: 2018-09-30 | Disposition: A | Discharge status: Home / Self Care | Payer: Medicare HMO | Source: Ambulatory Visit | Attending: Family Medicine | Admitting: Family Medicine

## 2018-09-30 DIAGNOSIS — Z1231 Encounter for screening mammogram for malignant neoplasm of breast: Secondary | ICD-10-CM | POA: Diagnosis not present

## 2018-10-01 DIAGNOSIS — M238X1 Other internal derangements of right knee: Secondary | ICD-10-CM | POA: Diagnosis not present

## 2018-10-02 ENCOUNTER — Encounter: Payer: Self-pay | Admitting: *Deleted

## 2018-10-02 ENCOUNTER — Other Ambulatory Visit: Payer: Self-pay

## 2018-10-04 ENCOUNTER — Other Ambulatory Visit: Payer: Self-pay

## 2018-10-04 ENCOUNTER — Other Ambulatory Visit
Admission: RE | Admit: 2018-10-04 | Discharge: 2018-10-04 | Disposition: A | Discharge status: Home / Self Care | Payer: Medicare HMO | Source: Ambulatory Visit | Attending: Orthopedic Surgery | Admitting: Orthopedic Surgery

## 2018-10-04 DIAGNOSIS — Z01812 Encounter for preprocedural laboratory examination: Secondary | ICD-10-CM | POA: Diagnosis not present

## 2018-10-04 DIAGNOSIS — Z20828 Contact with and (suspected) exposure to other viral communicable diseases: Secondary | ICD-10-CM | POA: Diagnosis not present

## 2018-10-05 LAB — SARS CORONAVIRUS 2 (TAT 6-24 HRS): SARS Coronavirus 2: NEGATIVE

## 2018-10-08 ENCOUNTER — Ambulatory Visit
Admission: RE | Admit: 2018-10-08 | Discharge: 2018-10-08 | Disposition: A | Discharge status: Home / Self Care | Payer: Medicare HMO | Attending: Orthopedic Surgery | Admitting: Orthopedic Surgery

## 2018-10-08 ENCOUNTER — Ambulatory Visit: Payer: Medicare HMO | Admitting: Anesthesiology

## 2018-10-08 ENCOUNTER — Encounter
Admission: RE | Disposition: A | Discharge status: Home / Self Care | Payer: Self-pay | Source: Home / Self Care | Attending: Orthopedic Surgery

## 2018-10-08 ENCOUNTER — Other Ambulatory Visit: Payer: Self-pay

## 2018-10-08 DIAGNOSIS — G2581 Restless legs syndrome: Secondary | ICD-10-CM | POA: Diagnosis not present

## 2018-10-08 DIAGNOSIS — M93261 Osteochondritis dissecans, right knee: Secondary | ICD-10-CM | POA: Diagnosis not present

## 2018-10-08 DIAGNOSIS — Z7951 Long term (current) use of inhaled steroids: Secondary | ICD-10-CM | POA: Diagnosis not present

## 2018-10-08 DIAGNOSIS — Z7901 Long term (current) use of anticoagulants: Secondary | ICD-10-CM | POA: Insufficient documentation

## 2018-10-08 DIAGNOSIS — M238X1 Other internal derangements of right knee: Secondary | ICD-10-CM | POA: Diagnosis not present

## 2018-10-08 DIAGNOSIS — M25561 Pain in right knee: Secondary | ICD-10-CM | POA: Diagnosis not present

## 2018-10-08 DIAGNOSIS — Z79899 Other long term (current) drug therapy: Secondary | ICD-10-CM | POA: Diagnosis not present

## 2018-10-08 DIAGNOSIS — G8918 Other acute postprocedural pain: Secondary | ICD-10-CM | POA: Diagnosis not present

## 2018-10-08 DIAGNOSIS — Z86718 Personal history of other venous thrombosis and embolism: Secondary | ICD-10-CM | POA: Diagnosis not present

## 2018-10-08 DIAGNOSIS — J45909 Unspecified asthma, uncomplicated: Secondary | ICD-10-CM | POA: Diagnosis not present

## 2018-10-08 DIAGNOSIS — F329 Major depressive disorder, single episode, unspecified: Secondary | ICD-10-CM | POA: Insufficient documentation

## 2018-10-08 DIAGNOSIS — F17210 Nicotine dependence, cigarettes, uncomplicated: Secondary | ICD-10-CM | POA: Diagnosis not present

## 2018-10-08 DIAGNOSIS — G609 Hereditary and idiopathic neuropathy, unspecified: Secondary | ICD-10-CM | POA: Diagnosis not present

## 2018-10-08 DIAGNOSIS — M2341 Loose body in knee, right knee: Secondary | ICD-10-CM | POA: Diagnosis not present

## 2018-10-08 DIAGNOSIS — M228X1 Other disorders of patella, right knee: Secondary | ICD-10-CM | POA: Diagnosis not present

## 2018-10-08 DIAGNOSIS — M1711 Unilateral primary osteoarthritis, right knee: Secondary | ICD-10-CM | POA: Diagnosis not present

## 2018-10-08 DIAGNOSIS — M794 Hypertrophy of (infrapatellar) fat pad: Secondary | ICD-10-CM | POA: Diagnosis not present

## 2018-10-08 DIAGNOSIS — M67361 Transient synovitis, right knee: Secondary | ICD-10-CM | POA: Diagnosis not present

## 2018-10-08 HISTORY — PX: KNEE ARTHROSCOPY WITH OSTEOCHONDRAL DEFECT REPAIR: SHX6579

## 2018-10-08 HISTORY — DX: Presence of dental prosthetic device (complete) (partial): Z97.2

## 2018-10-08 HISTORY — DX: Presence of other vascular implants and grafts: Z95.828

## 2018-10-08 HISTORY — DX: Family history of other specified conditions: Z84.89

## 2018-10-08 HISTORY — DX: Motion sickness, initial encounter: T75.3XXA

## 2018-10-08 LAB — POCT PREGNANCY, URINE: Preg Test, Ur: NEGATIVE

## 2018-10-08 SURGERY — ARTHROSCOPY, KNEE, WITH OSTEOCHONDRAL DEFECT REPAIR
Anesthesia: General | Site: Knee | Laterality: Right

## 2018-10-08 MED ORDER — KETOROLAC TROMETHAMINE 30 MG/ML IJ SOLN
INTRAMUSCULAR | Status: DC | PRN
  Administered 2018-10-08: 30 mg via INTRAVENOUS

## 2018-10-08 MED ORDER — HYDROMORPHONE HCL 1 MG/ML IJ SOLN
0.2500 mg | INTRAMUSCULAR | Status: DC | PRN
  Administered 2018-10-08: 0.5 mg via INTRAVENOUS

## 2018-10-08 MED ORDER — GLYCOPYRROLATE 0.2 MG/ML IJ SOLN
INTRAMUSCULAR | Status: DC | PRN
  Administered 2018-10-08: 0.1 mg via INTRAVENOUS

## 2018-10-08 MED ORDER — ONDANSETRON HCL 4 MG/2ML IJ SOLN
INTRAMUSCULAR | Status: DC | PRN
  Administered 2018-10-08: 4 mg via INTRAVENOUS

## 2018-10-08 MED ORDER — PROPOFOL 10 MG/ML IV BOLUS
INTRAVENOUS | Status: DC | PRN
  Administered 2018-10-08: 200 mg via INTRAVENOUS

## 2018-10-08 MED ORDER — LACTATED RINGERS IV SOLN
100.0000 mL/h | INTRAVENOUS | Status: DC
  Administered 2018-10-08: 12:00:00 via INTRAVENOUS

## 2018-10-08 MED ORDER — LIDOCAINE HCL (CARDIAC) PF 100 MG/5ML IV SOSY
PREFILLED_SYRINGE | INTRAVENOUS | Status: DC | PRN
  Administered 2018-10-08: 30 mg via INTRATRACHEAL

## 2018-10-08 MED ORDER — MEPERIDINE HCL 25 MG/ML IJ SOLN: 6.2500 mg | INTRAMUSCULAR | Status: DC | PRN

## 2018-10-08 MED ORDER — ROPIVACAINE HCL 5 MG/ML IJ SOLN
INTRAMUSCULAR | Status: DC | PRN
  Administered 2018-10-08: 20 mL via EPIDURAL

## 2018-10-08 MED ORDER — LIDOCAINE-EPINEPHRINE 1 %-1:100000 IJ SOLN: INTRAMUSCULAR | Status: DC | PRN

## 2018-10-08 MED ORDER — ACETAMINOPHEN 10 MG/ML IV SOLN
INTRAVENOUS | Status: DC | PRN
  Administered 2018-10-08: 1000 mg via INTRAVENOUS

## 2018-10-08 MED ORDER — DEXTROSE 5 % IV SOLN
600.0000 mg | Freq: Once | INTRAVENOUS | Status: AC
  Administered 2018-10-08: 600 mg via INTRAVENOUS

## 2018-10-08 MED ORDER — ONDANSETRON 4 MG PO TBDP: 4.0000 mg | ORAL_TABLET | Freq: Three times a day (TID) | ORAL | 0 refills | Status: DC | PRN

## 2018-10-08 MED ORDER — KETAMINE HCL 10 MG/ML IJ SOLN
INTRAMUSCULAR | Status: DC | PRN
  Administered 2018-10-08: 25 mg via INTRAVENOUS

## 2018-10-08 MED ORDER — LIDOCAINE-EPINEPHRINE 1 %-1:100000 IJ SOLN
INTRAMUSCULAR | Status: DC | PRN
  Administered 2018-10-08: 15 mL
  Administered 2018-10-08: 5 mL

## 2018-10-08 MED ORDER — FENTANYL CITRATE (PF) 100 MCG/2ML IJ SOLN
INTRAMUSCULAR | Status: DC | PRN
  Administered 2018-10-08 (×3): 50 ug via INTRAVENOUS
  Administered 2018-10-08: 100 ug via INTRAVENOUS
  Administered 2018-10-08: 50 ug via INTRAVENOUS

## 2018-10-08 MED ORDER — HYDROCODONE-ACETAMINOPHEN 5-325 MG PO TABS: 1.0000 | ORAL_TABLET | ORAL | 0 refills | Status: DC | PRN

## 2018-10-08 MED ORDER — MIDAZOLAM HCL 5 MG/5ML IJ SOLN
INTRAMUSCULAR | Status: DC | PRN
  Administered 2018-10-08: 2 mg via INTRAVENOUS

## 2018-10-08 MED ORDER — PROMETHAZINE HCL 25 MG/ML IJ SOLN: 6.2500 mg | INTRAMUSCULAR | Status: DC | PRN

## 2018-10-08 MED ORDER — ACETAMINOPHEN 500 MG PO TABS: 1000.0000 mg | ORAL_TABLET | Freq: Three times a day (TID) | ORAL | 2 refills | Status: AC

## 2018-10-08 MED ORDER — DEXAMETHASONE SODIUM PHOSPHATE 4 MG/ML IJ SOLN
INTRAMUSCULAR | Status: DC | PRN
  Administered 2018-10-08: 4 mg via INTRAVENOUS

## 2018-10-08 SURGICAL SUPPLY — 43 items
ADAPTER IRRIG TUBE 2 SPIKE SOL (ADAPTER) ×6 IMPLANT
BLADE SURG 15 STRL LF DISP TIS (BLADE) IMPLANT
BLADE SURG 15 STRL SS (BLADE)
BLADE SURG SZ11 CARB STEEL (BLADE) ×3 IMPLANT
BNDG COHESIVE 4X5 TAN STRL (GAUZE/BANDAGES/DRESSINGS) ×3 IMPLANT
BNDG ESMARK 6X12 TAN STRL LF (GAUZE/BANDAGES/DRESSINGS) ×3 IMPLANT
BUR RADIUS 3.5 (BURR) ×3 IMPLANT
BUR RADIUS 4.0X18.5 (BURR) IMPLANT
CHLORAPREP W/TINT 26 (MISCELLANEOUS) ×3 IMPLANT
COOLER POLAR GLACIER W/PUMP (MISCELLANEOUS) ×3 IMPLANT
COVER LIGHT HANDLE UNIVERSAL (MISCELLANEOUS) ×6 IMPLANT
CUFF TOURN SGL QUICK 30 (TOURNIQUET CUFF) ×2
CUFF TRNQT CYL 30X4X21-28X (TOURNIQUET CUFF) ×1 IMPLANT
DECANTER SPIKE VIAL GLASS SM (MISCELLANEOUS) ×3 IMPLANT
DRAPE IMP U-DRAPE 54X76 (DRAPES) ×3 IMPLANT
GAUZE SPONGE 4X4 12PLY STRL (GAUZE/BANDAGES/DRESSINGS) ×3 IMPLANT
GLOVE BIO SURGEON STRL SZ7.5 (GLOVE) ×3 IMPLANT
GLOVE BIOGEL PI IND STRL 8 (GLOVE) ×1 IMPLANT
GLOVE BIOGEL PI INDICATOR 8 (GLOVE) ×2
GOWN STRL REIN 2XL XLG LVL4 (GOWN DISPOSABLE) ×3 IMPLANT
GOWN STRL REUS W/ TWL LRG LVL3 (GOWN DISPOSABLE) ×1 IMPLANT
GOWN STRL REUS W/TWL LRG LVL3 (GOWN DISPOSABLE) ×2
IV LACTATED RINGER IRRG 3000ML (IV SOLUTION) ×24
IV LR IRRIG 3000ML ARTHROMATIC (IV SOLUTION) ×12 IMPLANT
KIT TURNOVER KIT A (KITS) ×3 IMPLANT
MAT ABSORB  FLUID 56X50 GRAY (MISCELLANEOUS) ×2
MAT ABSORB FLUID 56X50 GRAY (MISCELLANEOUS) ×1 IMPLANT
NEEDLE HYPO 21X1.5 SAFETY (NEEDLE) ×3 IMPLANT
NEPTUNE MANIFOLD (MISCELLANEOUS) ×3 IMPLANT
PACK ARTHROSCOPY KNEE (MISCELLANEOUS) ×3 IMPLANT
PAD ABD DERMACEA PRESS 5X9 (GAUZE/BANDAGES/DRESSINGS) ×3 IMPLANT
PAD WRAPON POLAR KNEE (MISCELLANEOUS) ×1 IMPLANT
PADDING CAST BLEND 6X4 STRL (MISCELLANEOUS) ×1 IMPLANT
PADDING STRL CAST 6IN (MISCELLANEOUS) ×2
PICK POWER XL 45DEG (MISCELLANEOUS) ×3 IMPLANT
SET TUBE SUCT SHAVER OUTFL 24K (TUBING) ×3 IMPLANT
SET TUBE TIP INTRA-ARTICULAR (MISCELLANEOUS) ×3 IMPLANT
SUT ETHILON 3-0 FS-10 30 BLK (SUTURE) ×3
SUTURE EHLN 3-0 FS-10 30 BLK (SUTURE) ×1 IMPLANT
TOWEL OR 17X26 4PK STRL BLUE (TOWEL DISPOSABLE) ×3 IMPLANT
TUBING ARTHRO INFLOW-ONLY STRL (TUBING) ×3 IMPLANT
WAND WEREWOLF FLOW 90D (MISCELLANEOUS) ×3 IMPLANT
WRAPON POLAR PAD KNEE (MISCELLANEOUS) ×3

## 2018-10-08 NOTE — Anesthesia Preprocedure Evaluation (Signed)
Anesthesia Evaluation  Patient identified by MRN, date of birth, ID band Patient awake    Reviewed: Allergy & Precautions, NPO status , Patient's Chart, lab work & pertinent test results  History of Anesthesia Complications Negative for: history of anesthetic complications  Airway Mallampati: II  TM Distance: >3 FB Neck ROM: Full    Dental  (+) Upper Dentures, Lower Dentures   Pulmonary asthma , Current Smoker and Patient abstained from smoking.,    breath sounds clear to auscultation       Cardiovascular (-) angina(-) DOE  Rhythm:Regular Rate:Normal     Neuro/Psych PSYCHIATRIC DISORDERS (PTSD, borderline personality disorder) Anxiety Depression  Neuromuscular disease (Neuropathy)    GI/Hepatic neg GERD  ,  Endo/Other    Renal/GU      Musculoskeletal  (+) Fibromyalgia -  Abdominal   Peds  Hematology  (+) Blood dyscrasia (H/o DVT s/p IVC filter, also on Xarelto), anemia ,   Anesthesia Other Findings  May-Thurner syndrome  Reproductive/Obstetrics                             Anesthesia Physical Anesthesia Plan  ASA: III  Anesthesia Plan: General   Post-op Pain Management:    Induction: Intravenous  PONV Risk Score and Plan: 2 and Ondansetron and Dexamethasone  Airway Management Planned: LMA  Additional Equipment:   Intra-op Plan:   Post-operative Plan: Extubation in OR  Informed Consent: I have reviewed the patients History and Physical, chart, labs and discussed the procedure including the risks, benefits and alternatives for the proposed anesthesia with the patient or authorized representative who has indicated his/her understanding and acceptance.       Plan Discussed with: CRNA and Anesthesiologist  Anesthesia Plan Comments:         Anesthesia Quick Evaluation

## 2018-10-08 NOTE — Discharge Instructions (Signed)
Arthroscopic Knee Surgery - Patella Microfracture   Post-Op Instructions   1. Bracing or crutches: Crutches will be provided at the time of discharge from the surgery center. Keep brace locked in extension at all times except as directed by physical therapy.    2. Ice: You may be provided with a device [facility](P[employer]) that allows you to ice the affected area effectively. Otherwise you can ice manually.    3. Driving:  Plan on not driving for at least 2-4 weeks. Please note that you are advised NOT to drive while taking narcotic pain medications as you may be impaired and unsafe to drive.   4. Activity: Ankle pumps several times an hour while awake to prevent blood clots. Weight bearing: Weight Bearing as Tolerated with brace locked in extension. Limit knee flexion to 30 degrees until directed by physical therapy.  Use crutches for at least 1-2 weeks.  Elevate knee above heart level as much as possible for one week. Avoid standing more than 5 minutes (consecutively) for the first week. No exercise involving the knee until cleared by the surgeon or physical therapist.  Avoid long distance travel for 4 weeks.  CPM Machine: Week 1: 0-40 degrees Weeks 2-4: Advance by 5 degrees daily as tolerated until reaching a maximum of 0-90 degrees. Weeks 4 onward: Advance as tolerated to full knee range of motion  Perform 6-8 hours/day   5. Medications:  - You have been provided a prescription for narcotic pain medicine. After surgery, take 1-2 narcotic tablets every 4 hours if needed for severe pain. If it has tylenol (acetaminophen), please do not take a total of more than 3000mg /day of tylenol.  - A prescription for anti-nausea medication will be provided in case the narcotic medicine causes nausea - take 1 tablet every 6 hours only if nauseated.  - Resume home Xarelto the day after surgery.  -Take tylenol 1000 every 8 hours for pain.  May stop tylenol 3 days after surgery or when you are having minimal pain.  If your narcotic has tylenol (acetaminophen), please do not take a total of more than 3000mg /day of tylenol.    If you are taking prescription medication for anxiety, depression, insomnia, muscle spasm, chronic pain, or for attention deficit disorder you are advised that you are at a higher risk of adverse effects with use of narcotics post-op, including narcotic addiction/dependence, depressed breathing, death. If you use non-prescribed substances: alcohol, marijuana, cocaine, heroin, methamphetamines, etc., you are at a higher risk of adverse effects with use of narcotics post-op, including narcotic addiction/dependence, depressed breathing, death. You are advised that taking > 50 morphine milligram equivalents (MME) of narcotic pain medication per day results in twice the risk of overdose or death. For your prescription provided: oxycodone 5 mg - taking more than 6 tablets per day. Be advised that we will prescribe narcotics short-term, for acute post-operative pain only - 1 week for minor operations such as knee arthroscopy for meniscus tear resection, and 3 weeks for major operations such as knee repair/reconstruction surgeries.   6. Bandages: The physical therapist should change the bandages at the first post-op appointment. If needed, the dressing supplies have been provided to you. You may shower after this with waterproof bandaids covering the incisions.    7. Physical Therapy: 2 times per week for the first 4 weeks, then 1-2 times per week from weeks 4-8 post-op. Therapy typically starts on post operative Day 3 or 4. You have been provided an order for physical  therapy. The therapist will provide home exercises.   8. Work: May return to full work when off of crutches. May do light duty/desk job in approximately 1-2 weeks when off of narcotics, pain is well-controlled, and swelling has decreased.   9. Post-Op Appointments: Your first post-op appointment will be with Dr. Posey Pronto in approximately 2  weeks time.    If you find that they have not been scheduled please call the Orthopaedic Appointment front desk at [phone].     General Anesthesia, Adult, Care After This sheet gives you information about how to care for yourself after your procedure. Your health care provider may also give you more specific instructions. If you have problems or questions, contact your health care provider. What can I expect after the procedure? After the procedure, the following side effects are common:  Pain or discomfort at the IV site.  Nausea.  Vomiting.  Sore throat.  Trouble concentrating.  Feeling cold or chills.  Weak or tired.  Sleepiness and fatigue.  Soreness and body aches. These side effects can affect parts of the body that were not involved in surgery. Follow these instructions at home:  For at least 24 hours after the procedure:  Have a responsible adult stay with you. It is important to have someone help care for you until you are awake and alert.  Rest as needed.  Do not: ? Participate in activities in which you could fall or become injured. ? Drive. ? Use heavy machinery. ? Drink alcohol. ? Take sleeping pills or medicines that cause drowsiness. ? Make important decisions or sign legal documents. ? Take care of children on your own. Eating and drinking  Follow any instructions from your health care provider about eating or drinking restrictions.  When you feel hungry, start by eating small amounts of foods that are soft and easy to digest (bland), such as toast. Gradually return to your regular diet.  Drink enough fluid to keep your urine pale yellow.  If you vomit, rehydrate by drinking water, juice, or clear broth. General instructions  If you have sleep apnea, surgery and certain medicines can increase your risk for breathing problems. Follow instructions from your health care provider about wearing your sleep device: ? Anytime you are sleeping,  including during daytime naps. ? While taking prescription pain medicines, sleeping medicines, or medicines that make you drowsy.  Return to your normal activities as told by your health care provider. Ask your health care provider what activities are safe for you.  Take over-the-counter and prescription medicines only as told by your health care provider.  If you smoke, do not smoke without supervision.  Keep all follow-up visits as told by your health care provider. This is important. Contact a health care provider if:  You have nausea or vomiting that does not get better with medicine.  You cannot eat or drink without vomiting.  You have pain that does not get better with medicine.  You are unable to pass urine.  You develop a skin rash.  You have a fever.  You have redness around your IV site that gets worse. Get help right away if:  You have difficulty breathing.  You have chest pain.  You have blood in your urine or stool, or you vomit blood. Summary  After the procedure, it is common to have a sore throat or nausea. It is also common to feel tired.  Have a responsible adult stay with you for the first 24 hours after  general anesthesia. It is important to have someone help care for you until you are awake and alert.  When you feel hungry, start by eating small amounts of foods that are soft and easy to digest (bland), such as toast. Gradually return to your regular diet.  Drink enough fluid to keep your urine pale yellow.  Return to your normal activities as told by your health care provider. Ask your health care provider what activities are safe for you. This information is not intended to replace advice given to you by your health care provider. Make sure you discuss any questions you have with your health care provider. Document Released: 05/01/2000 Document Revised: 01/26/2017 Document Reviewed: 09/08/2016 Elsevier Patient Education  2020 [employer].

## 2018-10-08 NOTE — Anesthesia Postprocedure Evaluation (Signed)
Anesthesia Post Note  Patient: Lindsey Willis  Procedure(s) Performed: KNEE ARTHROSCOPY WITH MICROFRACTURE OF THE PATELLA, CHONDROPLASTY AND MACI BIOPSY (Right Knee)  Patient location during evaluation: PACU Anesthesia Type: General Level of consciousness: awake and alert Pain management: pain level controlled Vital Signs Assessment: post-procedure vital signs reviewed and stable Respiratory status: spontaneous breathing, nonlabored ventilation, respiratory function stable and patient connected to nasal cannula oxygen Cardiovascular status: blood pressure returned to baseline and stable Postop Assessment: no apparent nausea or vomiting Anesthetic complications: no    Ladarryl Wrage A  Sandor Arboleda

## 2018-10-08 NOTE — Anesthesia Procedure Notes (Signed)
Anesthesia Regional Block: Adductor canal block   Pre-Anesthetic Checklist: ,, timeout performed, Correct Patient, Correct Site, Correct Laterality, Correct Procedure, Correct Position, site marked, Risks and benefits discussed,  Surgical consent,  Pre-op evaluation,  At surgeon's request and post-op pain management  Laterality: Right  Prep: chloraprep       Needles:  Injection technique: Single-shot  Needle Type: Echogenic Needle     Needle Length: 9cm  Needle Gauge: 21     Additional Needles:   Procedures:,,,, ultrasound used (permanent image in chart),,,,  Narrative:  Start time: 10/08/2018 1:40 PM End time: 10/08/2018 1:44 PM Injection made incrementally with aspirations every 5 mL.  Performed by: Personally  Anesthesiologist: Zaryan Yakubov, Fredric Dine, MD  Additional Notes: Functioning IV was confirmed and monitors applied. Ultrasound guidance: relevant anatomy identified, needle position confirmed, local anesthetic spread visualized around nerve(s)., vascular puncture avoided.  Image printed for medical record.  Negative aspiration and no paresthesias; incremental administration of local anesthetic. The patient tolerated the procedure well. Vitals signes recorded in RN notes.

## 2018-10-08 NOTE — Anesthesia Procedure Notes (Signed)
Procedure Name: LMA Insertion Date/Time: 10/08/2018 2:42 PM Performed by: Georga Bora, CRNA Pre-anesthesia Checklist: Patient identified, Emergency Drugs available, Suction available, Timeout performed and Patient being monitored Patient Re-evaluated:Patient Re-evaluated prior to induction Oxygen Delivery Method: Circle system utilized Preoxygenation: Pre-oxygenation with 100% oxygen Induction Type: IV induction LMA: LMA inserted LMA Size: 4.0 Number of attempts: 1 Placement Confirmation: positive ETCO2 and breath sounds checked- equal and bilateral Tube secured with: Tape

## 2018-10-08 NOTE — H&P (Signed)
Paper H&P to be scanned into permanent record. H&P reviewed. No significant changes noted.  

## 2018-10-08 NOTE — Transfer of Care (Signed)
Immediate Anesthesia Transfer of Care Note  Patient: Lindsey Willis  Procedure(s) Performed: KNEE ARTHROSCOPY WITH MICROFRACTURE OF THE PATELLA, CHONDROPLASTY AND MACI BIOPSY (Right Knee)  Patient Location: PACU  Anesthesia Type: General  Level of Consciousness: awake, alert  and patient cooperative  Airway and Oxygen Therapy: Patient Spontanous Breathing and Patient connected to supplemental oxygen  Post-op Assessment: Post-op Vital signs reviewed, Patient's Cardiovascular Status Stable, Respiratory Function Stable, Patent Airway and No signs of Nausea or vomiting  Post-op Vital Signs: Reviewed and stable  Complications: No apparent anesthesia complications

## 2018-10-08 NOTE — Op Note (Addendum)
OPERATIVE NOTE   SURGERY DATE: 10/08/2018    PRE-OP DIAGNOSIS:  1. Right patella osteochondral defect   POST-OP DIAGNOSIS:  1. Right patella osteochondral defect 2. Right knee medial compartment loose body  PROCEDURES:  1. Right knee arthroscopic patella microfracture 2. Right knee arthroscopic loose body removal 3. Right knee articular cartilage biopsy 4. Right knee partial synovectomy with debridement of Hoffa's fat pad  SURGEON: Rosealee AlbeeSunny H. Cyntia Staley, MD   ANESTHESIA: Gen   TOTAL IV FLUIDS: see anesthesia record   ESTIMATED BLOOD LOSS: minimal   TOURNIQUET TIME: 78 min   SPECIMENS: none   IMPLANTS: none   COMPLICATIONS: None apparent.   INDICATIONS: Lindsey Willis is a 43 y.o. female with a history of knee pain.  The patient is currently unable to do day-to-day activities such as walking, running, squatting, and/or climbing stairs without significant pain.  Nonoperative management such as activity modification and medications as well as corticosteroid injection have not improved symptoms.  Clinical exam and MRI were concerning for patellar chondral defect with underlying subchondral edema.  We had a lengthy discussion regarding optimal surgical treatment with options including microfracture, MACI, and osteochondral allograft.  The patient wished to proceed with an arthroscopic surgery in a single stage fashion so we elected to proceed with planned microfracture with concurrent articular cartilage biopsy in case MACI becomes necessary in the future.  She understands that the results of microfracture could be worse than with the other more involved procedures.  After discussion of risks, benefits, and alternatives to surgery, the patient elected to proceed.    OPERATIVE FINDINGS:    Examination under anesthesia: A careful examination under anesthesia was performed.  Passive range of motion was: Hyperextension: 2.  Extension: 0.  Flexion: 140.  Lachman: normal. Pivot Shift: normal.   Posterior drawer: normal.  Varus stability in full extension: normal.  Varus stability in 30 degrees of flexion: normal.  Valgus stability in full extension: normal.  Valgus stability in 30 degrees of flexion: normal. Patella: 2 quadrants lateral mobility with patella self-reducing, 2 quadrants medial mobility, normal tracking, no lateral tilt.     Intra-operative findings: A thorough arthroscopic examination of the knee was performed.  The findings are: 1. Suprapatellar pouch: normal 2. Undersurface of median ridge: Grade 4 chondral defect extending almost to the distal pole with defect measuring approximately 11 x 10 mm 3. Medial patellar facet: Grade 1-2 degenerative changes with additional proximal grade 4 lesion measuring approximately 5 x 8 mm 4. Lateral patellar facet: Diffuse grade 1-2 degenerative changes (proximal-distal) x 10mm (medial-lateral).  5. Trochlea: Normal 6. Lateral gutter/popliteus tendon: Normal 7. Hoffa's fat pad: Inflamed  and thickened 8. Medial gutter/plica: Normal 9. ACL: Normal 10. PCL: Normal 11. Medial meniscus: Normal 12. Medial compartment cartilage:  Grade 1 softening of the tibial plateau with approximately 6 x 5 mm loose body about the posterior horn of the medial meniscus 13. Lateral meniscus: Normal 14. Lateral compartment cartilage: Grade 1 softening of tibial plateau   DETAILS OF PROCEDURE: The patient was identified in the preoperative holding area and informed consent was obtained. The patient was brought to the operating room and placed supine on the OR table.  A tourniquet was placed on the thigh. The operative extremity was prescrubbed with alcohol, prepped with ChloraPrep, and draped in the usual sterile fashion. The patient was given preoperative IV antibiotics and a surgical time-out confirming patient identity, procedure, and laterality was performed.    A standard anterolateral portal was made  with an 11 blade. A standard anteromedial portal was  made using needle localization. A diagnostic arthroscopy was performed with the above findings.   An arthroscopic grasper was used to remove the loose body in the medial compartment.  A partial synovectomy was performed by using a combination of an oscillating shaver and ArthroCare wand to debride the significantly inflamed and thickened Hoffa's fat pad.  An articular cartilage biopsy was used by using an arthroscopic osteotome to obtain samples of articular cartilage from the lateral aspect of the intercondylar notch.  Next, an accessory superomedial portal was made with needle localization.    Shaver and curette were used to create vertical walls around both grade 4 patellar defects with sizes and locations as described above.  Base of defects was appropriately debrided with a curette.   Using needle localization, a separate inferomedial portal was made with the knee in extension to allow for a better angle for the microfracture. An Arthrex PowerPick was used to microfracture the patella in a standard fashion.  Tourniquet was released and egress of marrow elements and blood was noted from the microfracture sites.  Fluid was evacuated from the knee joint.   Portal sites were closed with 3-0 Nylon. Xeroform was applied to portal sites. Leg was wrapped in cotton and bias wrap.  Polar Care and hinged knee brace locked at 0 degrees were applied.  The patient was brought to PACU in stable condition.  Additionally, this case had increased complexity compared to the presence of 2 separate patellar grade 4 lesions.  Addressing both lesions necessitated the use of an accessory portal and increased surgical time by approximately 30 minutes.    DISPOSITION: PACU - hemodynamically stable.   POSTOPERATIVE PLAN: The patient will be discharged home today. The patient will be WBAT with hinged knee brace locked in extension.  Brace to be unlocked only to 30 degrees for 6 weeks for any active range of motion such that the  patellar lesion does not engage in the trochlea. Physical therapy to begin post-op day 2-3 for knee range of motion, patellar mobilization and scar mobilization.  CPM machine to be delivered and use ~6-8 hours/day. Start at 0-40 for week 1. Resume home Xarelto on POD#1. Follow up as outpatient in ~2 weeks.

## 2018-10-09 ENCOUNTER — Encounter: Payer: Self-pay | Admitting: Orthopedic Surgery

## 2018-10-10 DIAGNOSIS — M6281 Muscle weakness (generalized): Secondary | ICD-10-CM | POA: Diagnosis not present

## 2018-10-10 DIAGNOSIS — M238X1 Other internal derangements of right knee: Secondary | ICD-10-CM | POA: Diagnosis not present

## 2018-10-10 DIAGNOSIS — M25661 Stiffness of right knee, not elsewhere classified: Secondary | ICD-10-CM | POA: Diagnosis not present

## 2018-10-10 DIAGNOSIS — M25561 Pain in right knee: Secondary | ICD-10-CM | POA: Diagnosis not present

## 2018-10-15 DIAGNOSIS — M6281 Muscle weakness (generalized): Secondary | ICD-10-CM | POA: Diagnosis not present

## 2018-10-15 DIAGNOSIS — M238X1 Other internal derangements of right knee: Secondary | ICD-10-CM | POA: Diagnosis not present

## 2018-10-15 DIAGNOSIS — M25561 Pain in right knee: Secondary | ICD-10-CM | POA: Diagnosis not present

## 2018-10-15 DIAGNOSIS — M25661 Stiffness of right knee, not elsewhere classified: Secondary | ICD-10-CM | POA: Diagnosis not present

## 2018-10-17 DIAGNOSIS — M238X1 Other internal derangements of right knee: Secondary | ICD-10-CM | POA: Diagnosis not present

## 2018-10-17 DIAGNOSIS — M6281 Muscle weakness (generalized): Secondary | ICD-10-CM | POA: Diagnosis not present

## 2018-10-17 DIAGNOSIS — M25561 Pain in right knee: Secondary | ICD-10-CM | POA: Diagnosis not present

## 2018-10-17 DIAGNOSIS — M25661 Stiffness of right knee, not elsewhere classified: Secondary | ICD-10-CM | POA: Diagnosis not present

## 2018-10-24 DIAGNOSIS — M238X1 Other internal derangements of right knee: Secondary | ICD-10-CM | POA: Diagnosis not present

## 2018-10-24 DIAGNOSIS — M25661 Stiffness of right knee, not elsewhere classified: Secondary | ICD-10-CM | POA: Diagnosis not present

## 2018-10-24 DIAGNOSIS — M6281 Muscle weakness (generalized): Secondary | ICD-10-CM | POA: Diagnosis not present

## 2018-10-24 DIAGNOSIS — M25561 Pain in right knee: Secondary | ICD-10-CM | POA: Diagnosis not present

## 2018-10-29 DIAGNOSIS — M6281 Muscle weakness (generalized): Secondary | ICD-10-CM | POA: Diagnosis not present

## 2018-10-29 DIAGNOSIS — M25661 Stiffness of right knee, not elsewhere classified: Secondary | ICD-10-CM | POA: Diagnosis not present

## 2018-10-29 DIAGNOSIS — M238X1 Other internal derangements of right knee: Secondary | ICD-10-CM | POA: Diagnosis not present

## 2018-10-29 DIAGNOSIS — M25561 Pain in right knee: Secondary | ICD-10-CM | POA: Diagnosis not present

## 2018-11-14 DIAGNOSIS — M6281 Muscle weakness (generalized): Secondary | ICD-10-CM | POA: Diagnosis not present

## 2018-11-14 DIAGNOSIS — M238X1 Other internal derangements of right knee: Secondary | ICD-10-CM | POA: Diagnosis not present

## 2018-11-14 DIAGNOSIS — M25561 Pain in right knee: Secondary | ICD-10-CM | POA: Diagnosis not present

## 2018-11-14 DIAGNOSIS — M25661 Stiffness of right knee, not elsewhere classified: Secondary | ICD-10-CM | POA: Diagnosis not present

## 2018-11-18 DIAGNOSIS — M25661 Stiffness of right knee, not elsewhere classified: Secondary | ICD-10-CM | POA: Diagnosis not present

## 2018-11-18 DIAGNOSIS — M6281 Muscle weakness (generalized): Secondary | ICD-10-CM | POA: Diagnosis not present

## 2018-11-18 DIAGNOSIS — M238X1 Other internal derangements of right knee: Secondary | ICD-10-CM | POA: Diagnosis not present

## 2018-11-18 DIAGNOSIS — M25561 Pain in right knee: Secondary | ICD-10-CM | POA: Diagnosis not present

## 2018-11-19 DIAGNOSIS — Z23 Encounter for immunization: Secondary | ICD-10-CM | POA: Diagnosis not present

## 2018-11-21 DIAGNOSIS — M25661 Stiffness of right knee, not elsewhere classified: Secondary | ICD-10-CM | POA: Diagnosis not present

## 2018-11-21 DIAGNOSIS — M25561 Pain in right knee: Secondary | ICD-10-CM | POA: Diagnosis not present

## 2018-11-21 DIAGNOSIS — M238X1 Other internal derangements of right knee: Secondary | ICD-10-CM | POA: Diagnosis not present

## 2018-11-21 DIAGNOSIS — M6281 Muscle weakness (generalized): Secondary | ICD-10-CM | POA: Diagnosis not present

## 2018-11-26 DIAGNOSIS — M25661 Stiffness of right knee, not elsewhere classified: Secondary | ICD-10-CM | POA: Diagnosis not present

## 2018-11-26 DIAGNOSIS — M238X1 Other internal derangements of right knee: Secondary | ICD-10-CM | POA: Diagnosis not present

## 2018-11-26 DIAGNOSIS — M6281 Muscle weakness (generalized): Secondary | ICD-10-CM | POA: Diagnosis not present

## 2018-11-26 DIAGNOSIS — M25561 Pain in right knee: Secondary | ICD-10-CM | POA: Diagnosis not present

## 2018-11-27 DIAGNOSIS — Z885 Allergy status to narcotic agent status: Secondary | ICD-10-CM | POA: Diagnosis not present

## 2018-11-27 DIAGNOSIS — Z86718 Personal history of other venous thrombosis and embolism: Secondary | ICD-10-CM | POA: Diagnosis not present

## 2018-11-27 DIAGNOSIS — F17210 Nicotine dependence, cigarettes, uncomplicated: Secondary | ICD-10-CM | POA: Diagnosis not present

## 2018-11-27 DIAGNOSIS — Z7901 Long term (current) use of anticoagulants: Secondary | ICD-10-CM | POA: Diagnosis not present

## 2018-11-27 DIAGNOSIS — G90521 Complex regional pain syndrome I of right lower limb: Secondary | ICD-10-CM | POA: Diagnosis not present

## 2018-11-27 DIAGNOSIS — M797 Fibromyalgia: Secondary | ICD-10-CM | POA: Diagnosis not present

## 2018-11-27 DIAGNOSIS — F329 Major depressive disorder, single episode, unspecified: Secondary | ICD-10-CM | POA: Diagnosis not present

## 2018-11-27 DIAGNOSIS — F4310 Post-traumatic stress disorder, unspecified: Secondary | ICD-10-CM | POA: Diagnosis not present

## 2018-11-27 DIAGNOSIS — Z79899 Other long term (current) drug therapy: Secondary | ICD-10-CM | POA: Diagnosis not present

## 2018-11-27 DIAGNOSIS — M25561 Pain in right knee: Secondary | ICD-10-CM | POA: Diagnosis not present

## 2018-12-03 DIAGNOSIS — M25661 Stiffness of right knee, not elsewhere classified: Secondary | ICD-10-CM | POA: Diagnosis not present

## 2018-12-03 DIAGNOSIS — M25561 Pain in right knee: Secondary | ICD-10-CM | POA: Diagnosis not present

## 2018-12-03 DIAGNOSIS — M238X1 Other internal derangements of right knee: Secondary | ICD-10-CM | POA: Diagnosis not present

## 2018-12-03 DIAGNOSIS — M6281 Muscle weakness (generalized): Secondary | ICD-10-CM | POA: Diagnosis not present

## 2018-12-12 DIAGNOSIS — M25561 Pain in right knee: Secondary | ICD-10-CM | POA: Diagnosis not present

## 2018-12-12 DIAGNOSIS — M6281 Muscle weakness (generalized): Secondary | ICD-10-CM | POA: Diagnosis not present

## 2018-12-12 DIAGNOSIS — M25661 Stiffness of right knee, not elsewhere classified: Secondary | ICD-10-CM | POA: Diagnosis not present

## 2018-12-12 DIAGNOSIS — M238X1 Other internal derangements of right knee: Secondary | ICD-10-CM | POA: Diagnosis not present

## 2018-12-21 DIAGNOSIS — M25561 Pain in right knee: Secondary | ICD-10-CM | POA: Diagnosis not present

## 2018-12-21 DIAGNOSIS — M7989 Other specified soft tissue disorders: Secondary | ICD-10-CM | POA: Diagnosis not present

## 2019-01-07 DIAGNOSIS — M238X1 Other internal derangements of right knee: Secondary | ICD-10-CM | POA: Diagnosis not present

## 2019-01-13 DIAGNOSIS — M25561 Pain in right knee: Secondary | ICD-10-CM | POA: Diagnosis not present

## 2019-01-13 DIAGNOSIS — M25661 Stiffness of right knee, not elsewhere classified: Secondary | ICD-10-CM | POA: Diagnosis not present

## 2019-01-13 DIAGNOSIS — M238X1 Other internal derangements of right knee: Secondary | ICD-10-CM | POA: Diagnosis not present

## 2019-01-13 DIAGNOSIS — M6281 Muscle weakness (generalized): Secondary | ICD-10-CM | POA: Diagnosis not present

## 2019-01-15 DIAGNOSIS — M25661 Stiffness of right knee, not elsewhere classified: Secondary | ICD-10-CM | POA: Diagnosis not present

## 2019-01-15 DIAGNOSIS — M6281 Muscle weakness (generalized): Secondary | ICD-10-CM | POA: Diagnosis not present

## 2019-01-15 DIAGNOSIS — M238X1 Other internal derangements of right knee: Secondary | ICD-10-CM | POA: Diagnosis not present

## 2019-01-15 DIAGNOSIS — M25561 Pain in right knee: Secondary | ICD-10-CM | POA: Diagnosis not present

## 2019-01-21 DIAGNOSIS — M6281 Muscle weakness (generalized): Secondary | ICD-10-CM | POA: Diagnosis not present

## 2019-01-21 DIAGNOSIS — M25561 Pain in right knee: Secondary | ICD-10-CM | POA: Diagnosis not present

## 2019-01-21 DIAGNOSIS — M25661 Stiffness of right knee, not elsewhere classified: Secondary | ICD-10-CM | POA: Diagnosis not present

## 2019-01-21 DIAGNOSIS — M238X1 Other internal derangements of right knee: Secondary | ICD-10-CM | POA: Diagnosis not present

## 2019-01-23 DIAGNOSIS — M25661 Stiffness of right knee, not elsewhere classified: Secondary | ICD-10-CM | POA: Diagnosis not present

## 2019-01-23 DIAGNOSIS — M6281 Muscle weakness (generalized): Secondary | ICD-10-CM | POA: Diagnosis not present

## 2019-01-23 DIAGNOSIS — M25561 Pain in right knee: Secondary | ICD-10-CM | POA: Diagnosis not present

## 2019-01-23 DIAGNOSIS — M238X1 Other internal derangements of right knee: Secondary | ICD-10-CM | POA: Diagnosis not present

## 2019-01-28 DIAGNOSIS — M6281 Muscle weakness (generalized): Secondary | ICD-10-CM | POA: Diagnosis not present

## 2019-01-28 DIAGNOSIS — M238X1 Other internal derangements of right knee: Secondary | ICD-10-CM | POA: Diagnosis not present

## 2019-01-28 DIAGNOSIS — M25561 Pain in right knee: Secondary | ICD-10-CM | POA: Diagnosis not present

## 2019-01-28 DIAGNOSIS — M25661 Stiffness of right knee, not elsewhere classified: Secondary | ICD-10-CM | POA: Diagnosis not present

## 2019-02-13 DIAGNOSIS — M25561 Pain in right knee: Secondary | ICD-10-CM | POA: Diagnosis not present

## 2019-02-13 DIAGNOSIS — M6281 Muscle weakness (generalized): Secondary | ICD-10-CM | POA: Diagnosis not present

## 2019-02-13 DIAGNOSIS — M238X1 Other internal derangements of right knee: Secondary | ICD-10-CM | POA: Diagnosis not present

## 2019-02-13 DIAGNOSIS — M25661 Stiffness of right knee, not elsewhere classified: Secondary | ICD-10-CM | POA: Diagnosis not present

## 2019-02-18 DIAGNOSIS — M25561 Pain in right knee: Secondary | ICD-10-CM | POA: Diagnosis not present

## 2019-02-18 DIAGNOSIS — M6281 Muscle weakness (generalized): Secondary | ICD-10-CM | POA: Diagnosis not present

## 2019-02-18 DIAGNOSIS — M238X1 Other internal derangements of right knee: Secondary | ICD-10-CM | POA: Diagnosis not present

## 2019-02-18 DIAGNOSIS — M25661 Stiffness of right knee, not elsewhere classified: Secondary | ICD-10-CM | POA: Diagnosis not present

## 2019-02-20 DIAGNOSIS — M25661 Stiffness of right knee, not elsewhere classified: Secondary | ICD-10-CM | POA: Diagnosis not present

## 2019-02-20 DIAGNOSIS — M6281 Muscle weakness (generalized): Secondary | ICD-10-CM | POA: Diagnosis not present

## 2019-02-20 DIAGNOSIS — M25561 Pain in right knee: Secondary | ICD-10-CM | POA: Diagnosis not present

## 2019-02-20 DIAGNOSIS — M238X1 Other internal derangements of right knee: Secondary | ICD-10-CM | POA: Diagnosis not present

## 2019-02-25 DIAGNOSIS — M238X1 Other internal derangements of right knee: Secondary | ICD-10-CM | POA: Diagnosis not present

## 2019-02-25 DIAGNOSIS — M25661 Stiffness of right knee, not elsewhere classified: Secondary | ICD-10-CM | POA: Diagnosis not present

## 2019-02-25 DIAGNOSIS — M6281 Muscle weakness (generalized): Secondary | ICD-10-CM | POA: Diagnosis not present

## 2019-02-25 DIAGNOSIS — M25561 Pain in right knee: Secondary | ICD-10-CM | POA: Diagnosis not present

## 2019-02-27 DIAGNOSIS — M25561 Pain in right knee: Secondary | ICD-10-CM | POA: Diagnosis not present

## 2019-02-27 DIAGNOSIS — M238X1 Other internal derangements of right knee: Secondary | ICD-10-CM | POA: Diagnosis not present

## 2019-02-27 DIAGNOSIS — M6281 Muscle weakness (generalized): Secondary | ICD-10-CM | POA: Diagnosis not present

## 2019-02-27 DIAGNOSIS — M25661 Stiffness of right knee, not elsewhere classified: Secondary | ICD-10-CM | POA: Diagnosis not present

## 2019-03-04 DIAGNOSIS — M6281 Muscle weakness (generalized): Secondary | ICD-10-CM | POA: Diagnosis not present

## 2019-03-04 DIAGNOSIS — M25561 Pain in right knee: Secondary | ICD-10-CM | POA: Diagnosis not present

## 2019-03-04 DIAGNOSIS — M25661 Stiffness of right knee, not elsewhere classified: Secondary | ICD-10-CM | POA: Diagnosis not present

## 2019-03-04 DIAGNOSIS — M238X1 Other internal derangements of right knee: Secondary | ICD-10-CM | POA: Diagnosis not present

## 2019-03-11 DIAGNOSIS — M6281 Muscle weakness (generalized): Secondary | ICD-10-CM | POA: Diagnosis not present

## 2019-03-11 DIAGNOSIS — M25661 Stiffness of right knee, not elsewhere classified: Secondary | ICD-10-CM | POA: Diagnosis not present

## 2019-03-11 DIAGNOSIS — M25561 Pain in right knee: Secondary | ICD-10-CM | POA: Diagnosis not present

## 2019-03-11 DIAGNOSIS — M238X1 Other internal derangements of right knee: Secondary | ICD-10-CM | POA: Diagnosis not present

## 2019-03-13 DIAGNOSIS — M25661 Stiffness of right knee, not elsewhere classified: Secondary | ICD-10-CM | POA: Diagnosis not present

## 2019-03-13 DIAGNOSIS — M6281 Muscle weakness (generalized): Secondary | ICD-10-CM | POA: Diagnosis not present

## 2019-03-13 DIAGNOSIS — M25561 Pain in right knee: Secondary | ICD-10-CM | POA: Diagnosis not present

## 2019-03-13 DIAGNOSIS — M238X1 Other internal derangements of right knee: Secondary | ICD-10-CM | POA: Diagnosis not present

## 2019-03-18 DIAGNOSIS — M6281 Muscle weakness (generalized): Secondary | ICD-10-CM | POA: Diagnosis not present

## 2019-03-18 DIAGNOSIS — M25561 Pain in right knee: Secondary | ICD-10-CM | POA: Diagnosis not present

## 2019-03-18 DIAGNOSIS — M25661 Stiffness of right knee, not elsewhere classified: Secondary | ICD-10-CM | POA: Diagnosis not present

## 2019-03-18 DIAGNOSIS — M238X1 Other internal derangements of right knee: Secondary | ICD-10-CM | POA: Diagnosis not present

## 2019-04-01 DIAGNOSIS — M238X1 Other internal derangements of right knee: Secondary | ICD-10-CM | POA: Diagnosis not present

## 2019-04-01 DIAGNOSIS — M25561 Pain in right knee: Secondary | ICD-10-CM | POA: Diagnosis not present

## 2019-04-01 DIAGNOSIS — M6281 Muscle weakness (generalized): Secondary | ICD-10-CM | POA: Diagnosis not present

## 2019-04-01 DIAGNOSIS — M25661 Stiffness of right knee, not elsewhere classified: Secondary | ICD-10-CM | POA: Diagnosis not present

## 2019-04-03 DIAGNOSIS — M25661 Stiffness of right knee, not elsewhere classified: Secondary | ICD-10-CM | POA: Diagnosis not present

## 2019-04-03 DIAGNOSIS — M6281 Muscle weakness (generalized): Secondary | ICD-10-CM | POA: Diagnosis not present

## 2019-04-03 DIAGNOSIS — M25561 Pain in right knee: Secondary | ICD-10-CM | POA: Diagnosis not present

## 2019-04-03 DIAGNOSIS — M238X1 Other internal derangements of right knee: Secondary | ICD-10-CM | POA: Diagnosis not present

## 2019-04-06 ENCOUNTER — Emergency Department
Admission: EM | Admit: 2019-04-06 | Discharge: 2019-04-06 | Disposition: A | Discharge status: Home / Self Care | Payer: Medicare HMO | Attending: Emergency Medicine | Admitting: Emergency Medicine

## 2019-04-06 ENCOUNTER — Other Ambulatory Visit: Payer: Self-pay

## 2019-04-06 DIAGNOSIS — Z7901 Long term (current) use of anticoagulants: Secondary | ICD-10-CM | POA: Diagnosis not present

## 2019-04-06 DIAGNOSIS — F17210 Nicotine dependence, cigarettes, uncomplicated: Secondary | ICD-10-CM | POA: Insufficient documentation

## 2019-04-06 DIAGNOSIS — R110 Nausea: Secondary | ICD-10-CM | POA: Diagnosis not present

## 2019-04-06 DIAGNOSIS — J45909 Unspecified asthma, uncomplicated: Secondary | ICD-10-CM | POA: Diagnosis not present

## 2019-04-06 DIAGNOSIS — R112 Nausea with vomiting, unspecified: Secondary | ICD-10-CM | POA: Insufficient documentation

## 2019-04-06 DIAGNOSIS — R1111 Vomiting without nausea: Secondary | ICD-10-CM | POA: Diagnosis not present

## 2019-04-06 DIAGNOSIS — F69 Unspecified disorder of adult personality and behavior: Secondary | ICD-10-CM | POA: Diagnosis not present

## 2019-04-06 LAB — COMPREHENSIVE METABOLIC PANEL
ALT: 13 U/L (ref 0–44)
AST: 20 U/L (ref 15–41)
Albumin: 4.3 g/dL (ref 3.5–5.0)
Alkaline Phosphatase: 48 U/L (ref 38–126)
Anion gap: 11 (ref 5–15)
BUN: 6 mg/dL (ref 6–20)
CO2: 20 mmol/L — ABNORMAL LOW (ref 22–32)
Calcium: 9.5 mg/dL (ref 8.9–10.3)
Chloride: 106 mmol/L (ref 98–111)
Creatinine, Ser: 0.84 mg/dL (ref 0.44–1.00)
GFR calc Af Amer: >60 mL/min (ref >60)
GFR calc non Af Amer: >60 mL/min (ref >60)
Glucose, Bld: 131 mg/dL — ABNORMAL HIGH (ref 70–99)
Potassium: 3.4 mmol/L — ABNORMAL LOW (ref 3.5–5.1)
Sodium: 137 mmol/L (ref 135–145)
Total Bilirubin: 0.6 mg/dL (ref 0.3–1.2)
Total Protein: 6.9 g/dL (ref 6.5–8.1)

## 2019-04-06 LAB — URINALYSIS, COMPLETE (UACMP) WITH MICROSCOPIC
Bilirubin Urine: NEGATIVE
Glucose, UA: NEGATIVE mg/dL
Hgb urine dipstick: NEGATIVE
Ketones, ur: NEGATIVE mg/dL
Leukocytes,Ua: NEGATIVE
Nitrite: NEGATIVE
Protein, ur: NEGATIVE mg/dL
Specific Gravity, Urine: 1.004 — ABNORMAL LOW (ref 1.005–1.030)
pH: 8 (ref 5.0–8.0)

## 2019-04-06 LAB — CBC
HCT: 38.9 % (ref 36.0–46.0)
Hemoglobin: 13.6 g/dL (ref 12.0–15.0)
MCH: 32 pg (ref 26.0–34.0)
MCHC: 35 g/dL (ref 30.0–36.0)
MCV: 91.5 fL (ref 80.0–100.0)
Platelets: 326 10*3/uL (ref 150–400)
RBC: 4.25 MIL/uL (ref 3.87–5.11)
RDW: 12.7 % (ref 11.5–15.5)
WBC: 10.5 10*3/uL (ref 4.0–10.5)
nRBC: 0 % (ref 0.0–0.2)

## 2019-04-06 LAB — LIPASE, BLOOD: Lipase: 22 U/L (ref 11–51)

## 2019-04-06 LAB — POCT PREGNANCY, URINE: Preg Test, Ur: NEGATIVE

## 2019-04-06 MED ORDER — ONDANSETRON 4 MG PO TBDP
4.0000 mg | ORAL_TABLET | Freq: Once | ORAL | Status: AC
  Administered 2019-04-06: 17:00:00 4 mg via ORAL
  Filled 2019-04-06: qty 1

## 2019-04-06 NOTE — ED Triage Notes (Signed)
Pt states that she thinks she ate something that wasn't cooked and since Thursday night pt has been vomiting. Pt has vomited 3 times today.

## 2019-04-06 NOTE — ED Notes (Signed)
Pt left prior to review of discharge instructions. Signature or vital signs could not be obtained prior to leaving.

## 2019-04-06 NOTE — ED Provider Notes (Signed)
[facility] Emergency Department Provider Note  ____________________________________________  Time seen: Approximately 4:34 PM  I have reviewed the triage vital signs and the nursing notes.   HISTORY  Chief Complaint Nausea and Vomiting    HPI Lindsey Willis is a 44 y.o. female that presents to the emergency department for evaluation of nausea and vomiting for 2 days.  Patient states that symptoms started after eating some possibly uncooked bacon on Thursday.  Patient states that she has had 3 episodes of vomiting today.  She denies any pain.  No fevers.  No shortness of breath, chest pain, abdominal pain, diarrhea, constipation.   Past Medical History:  Diagnosis Date  . Anemia   . Asthma   . Borderline personality disorder (HCC)   . Depression   . DVT of leg (deep venous thrombosis) (HCC)   . Family history of adverse reaction to anesthesia    Sister stopped breathing with versed  . Fibromyalgia   . May-Thurner syndrome   . Motion sickness    boats, back seat of car  . Presence of IVC filter   . PTSD (post-traumatic stress disorder)   . Wears dentures    full upper and lower    Patient Active Problem List   Diagnosis Date Noted  . Deep vein thrombosis (DVT) (HCC) 04/08/2015  . May-Thurner syndrome 04/08/2015  . Overdose 02/09/2015  . Ache in joint 11/21/2013  . Arteriovenous fistula (HCC) 06/20/2013  . Absolute anemia 10/19/2012  . Recurrent major depressive disorder, in partial remission (HCC) 07/13/2012  . Chronic pain 01/22/2012  . Clinical depression 01/22/2012  . Disease of lung 01/22/2012  . Fibromyalgia 01/22/2012  . LBP (low back pain) 01/22/2012  . Cannot sleep 01/22/2012  . Emotional neurotic disorder 01/22/2012  . Cannabis abuse, continuous 01/22/2012  . Pulmonary embolism with infarction (HCC) 01/22/2012  . Esophagitis, reflux 01/22/2012  . Current tobacco use 01/22/2012  . Narcotic drug use 11/02/2010  . Post-phlebitic  syndrome 07/05/2010  . Borderline personality disorder (HCC) 06/15/2010  . Chronic deep vein thrombosis (DVT) of lower extremity (HCC) 06/15/2010  . Hereditary and idiopathic neuropathy 06/15/2010  . Neurosis, posttraumatic 06/15/2010    Past Surgical History:  Procedure Laterality Date  . BREAST CYST ASPIRATION Right    neg  . CHOLECYSTECTOMY    . IVC FILTER PLACEMENT ([facility] HX)    . KNEE ARTHROSCOPY WITH OSTEOCHONDRAL DEFECT REPAIR Right 10/08/2018   Procedure: KNEE ARTHROSCOPY WITH MICROFRACTURE OF THE PATELLA, CHONDROPLASTY AND MACI BIOPSY;  Surgeon: Signa Kell, MD;  Location: [facility];  Service: Orthopedics;  Laterality: Right;  . LEG SURGERY    . TUBAL LIGATION      Prior to Admission medications   Medication Sig Start Date End Date Taking? Authorizing Provider  acetaminophen (TYLENOL) 500 MG tablet Take 2 tablets (1,000 mg total) by mouth every 8 (eight) hours. 10/08/18 10/08/19  Signa Kell, MD  albuterol (PROVENTIL HFA;VENTOLIN HFA) 108 (90 Base) MCG/ACT inhaler Inhale 1-2 puffs into the lungs every 6 (six) hours as needed for wheezing or shortness of breath (Patient does not use but is active prescription).    [provider]  clonazePAM (KLONOPIN) 0.5 MG tablet Take 0.5 mg by mouth 2 (two) times daily.    [provider]  cyclobenzaprine (FLEXERIL) 10 MG tablet Take 10 mg by mouth 4 (four) times daily as needed for muscle spasms.    [provider]  Fluticasone-Salmeterol (ADVAIR) 250-50 MCG/DOSE AEPB Inhale 1 puff into the  lungs 2 (two) times daily.    [provider]  gabapentin (NEURONTIN) 800 MG tablet Take 800 mg by mouth 4 (four) times daily.    [provider]  HYDROcodone-acetaminophen (NORCO) 5-325 MG tablet Take 1-2 tablets by mouth every 4 (four) hours as needed for moderate pain or severe pain. 10/08/18   Signa Kell, MD  levocetirizine (XYZAL) 5 MG tablet Take 5 mg by mouth every evening.    [provider]  mirtazapine (REMERON) 30 MG tablet Take 1 tablet (30 mg total) by mouth at bedtime. 04/08/15   Ravi, Himabindu, MD  montelukast (SINGULAIR) 10 MG tablet Take 10 mg by mouth at bedtime.    [provider]  Multiple Vitamins-Calcium (ONE-A-DAY WOMENS FORMULA PO) Take by mouth daily.    [provider]  ondansetron (ZOFRAN ODT) 4 MG disintegrating tablet Take 1 tablet (4 mg total) by mouth every 8 (eight) hours as needed for nausea or vomiting. 10/08/18   Signa Kell, MD  rivaroxaban (XARELTO) 20 MG TABS tablet Take 20 mg by mouth daily.    [provider]  rOPINIRole (REQUIP) 1 MG tablet Take 1 mg by mouth at bedtime.    [provider]  tiotropium (SPIRIVA) 18 MCG inhalation capsule Place 18 mcg into inhaler and inhale daily. Active prescription, patient just doesn't use    [provider]    Allergies Percocet [oxycodone-acetaminophen], Adhesive [tape], Amitriptyline, Hydrocodone, Vicodin [hydrocodone-acetaminophen], Fish allergy, Iodine, Keflex [cephalexin], Levaquin [levofloxacin in d5w], and Levofloxacin  Family History  Problem Relation Age of Onset  . Schizophrenia Mother   . Drug abuse Mother   . Heart defect Father   . Heart Problems Father   . Drug abuse Father   . Bipolar disorder Sister   . Bipolar disorder Maternal Uncle   . Schizophrenia Maternal Uncle   . Breast cancer Paternal Aunt   . Breast cancer Maternal Grandmother   . Breast cancer Paternal Grandmother     Social History Social History   Tobacco Use  . Smoking status: Current Every Day Smoker    Packs/day: 0.50    Years: 30.00    Pack years: 15.00    Types: Cigarettes    Start date: 04/07/1988  . Smokeless tobacco: Never Used  . Tobacco comment: since age 34  Substance Use Topics  . Alcohol use: No    Alcohol/week: 0.0 standard drinks  . Drug use: No    Types: Marijuana    Comment: last used a few months ago     Review of Systems  Constitutional: No  fever/chills ENT: No upper respiratory complaints. Cardiovascular: No chest pain. Respiratory: No cough. No SOB. Gastrointestinal: No abdominal pain.  Positive for nausea and vomiting. Musculoskeletal: Negative for musculoskeletal pain. Skin: Negative for rash, abrasions, lacerations, ecchymosis. Neurological: Negative for headaches, numbness or tingling   ____________________________________________   PHYSICAL EXAM:  VITAL SIGNS: ED Triage Vitals [04/06/19 1518]  Enc Vitals Group     BP 125/73     Pulse Rate 82     Resp 18     Temp 97.9 F (36.6 C)     Temp Source Oral     SpO2 99 %     Weight 163 lb (73.9 kg)     Height 5\' 3"  (1.6 m)     Head Circumference      Peak Flow      Pain Score 0     Pain Loc      Pain Edu?  Excl. in GC?      Constitutional: Alert and oriented. Well appearing and in no acute distress. Eyes: Conjunctivae are normal. PERRL. EOMI. Head: Atraumatic. ENT:      Ears:      Nose: No congestion/rhinnorhea.      Mouth/Throat: Mucous membranes are moist.  Neck: No stridor.   Cardiovascular: Normal rate, regular rhythm.  Good peripheral circulation. Respiratory: Normal respiratory effort without tachypnea or retractions. Lungs CTAB. Good air entry to the bases with no decreased or absent breath sounds. Gastrointestinal: Bowel sounds 4 quadrants. Soft and nontender to palpation. No guarding or rigidity. No palpable masses. No distention. Musculoskeletal: Full range of motion to all extremities. No gross deformities appreciated. Neurologic:  Normal speech and language. No gross focal neurologic deficits are appreciated.  Skin:  Skin is warm, dry and intact. No rash noted.   ____________________________________________   LABS (all labs ordered are listed, but only abnormal results are displayed)  Labs Reviewed  COMPREHENSIVE METABOLIC PANEL - Abnormal; Notable for the following components:      Result Value   Potassium 3.4 (*)    CO2 20  (*)    Glucose, Bld 131 (*)    All other components within normal limits  URINALYSIS, COMPLETE (UACMP) WITH MICROSCOPIC - Abnormal; Notable for the following components:   Color, Urine YELLOW (*)    APPearance CLEAR (*)    Specific Gravity, Urine 1.004 (*)    Bacteria, UA RARE (*)    All other components within normal limits  LIPASE, BLOOD  CBC  POC URINE PREG, ED  POCT PREGNANCY, URINE   ____________________________________________  EKG   ____________________________________________  RADIOLOGY  No results found.  ____________________________________________    PROCEDURES  Procedure(s) performed:    Procedures    Medications  ondansetron (ZOFRAN-ODT) disintegrating tablet 4 mg (4 mg Oral Given 04/06/19 1643)     ____________________________________________   INITIAL IMPRESSION / ASSESSMENT AND PLAN / ED COURSE  Pertinent labs & imaging results that were available during my care of the patient were reviewed by me and considered in my medical decision making (see chart for details).  Review of the [place] CSRS was performed in accordance of the [facility] prior to dispensing any controlled drugs.   Patient presented to the emergency department for evaluation of nausea and vomiting for 3 days.  Vital signs and exam are reassuring.  Lab work is largely unremarkable.  Patient was given a dose of Zofran in the emergency department for symptoms.  She denies any pain.`  Patient eloped the emergency department prior to reassessment.   Lindsey Willis was evaluated in Emergency Department on 04/06/2019 for the symptoms described in the history of present illness. She was evaluated in the context of the global COVID-19 pandemic, which necessitated consideration that the patient might be at risk for infection with the SARS-CoV-2 virus that causes COVID-19. Institutional protocols and algorithms that pertain to the evaluation of patients at risk for COVID-19 are in a state of rapid  change based on information released by regulatory bodies including the CDC and federal and state organizations. These policies and algorithms were followed during the patient's care in the ED.  ____________________________________________  FINAL CLINICAL IMPRESSION(S) / ED DIAGNOSES  Final diagnoses:  Nausea and vomiting, intractability of vomiting not specified, unspecified vomiting type      NEW MEDICATIONS STARTED DURING THIS VISIT:  ED Discharge Orders    None          This chart was  dictated using voice recognition software/Dragon. Despite best efforts to proofread, errors can occur which can change the meaning. Any change was purely unintentional.    Laban Emperor, PA-C 04/06/19 1839    Lavonia Drafts, MD 04/06/19 [phone]

## 2019-04-10 DIAGNOSIS — M6281 Muscle weakness (generalized): Secondary | ICD-10-CM | POA: Diagnosis not present

## 2019-04-10 DIAGNOSIS — M25561 Pain in right knee: Secondary | ICD-10-CM | POA: Diagnosis not present

## 2019-04-10 DIAGNOSIS — M25661 Stiffness of right knee, not elsewhere classified: Secondary | ICD-10-CM | POA: Diagnosis not present

## 2019-04-10 DIAGNOSIS — M238X1 Other internal derangements of right knee: Secondary | ICD-10-CM | POA: Diagnosis not present

## 2019-04-15 DIAGNOSIS — M6281 Muscle weakness (generalized): Secondary | ICD-10-CM | POA: Diagnosis not present

## 2019-04-15 DIAGNOSIS — M25561 Pain in right knee: Secondary | ICD-10-CM | POA: Diagnosis not present

## 2019-04-15 DIAGNOSIS — M25661 Stiffness of right knee, not elsewhere classified: Secondary | ICD-10-CM | POA: Diagnosis not present

## 2019-04-15 DIAGNOSIS — M238X1 Other internal derangements of right knee: Secondary | ICD-10-CM | POA: Diagnosis not present

## 2019-04-22 DIAGNOSIS — M25561 Pain in right knee: Secondary | ICD-10-CM | POA: Diagnosis not present

## 2019-04-22 DIAGNOSIS — M238X1 Other internal derangements of right knee: Secondary | ICD-10-CM | POA: Diagnosis not present

## 2019-04-22 DIAGNOSIS — M25661 Stiffness of right knee, not elsewhere classified: Secondary | ICD-10-CM | POA: Diagnosis not present

## 2019-04-22 DIAGNOSIS — M6281 Muscle weakness (generalized): Secondary | ICD-10-CM | POA: Diagnosis not present

## 2019-04-24 DIAGNOSIS — M25561 Pain in right knee: Secondary | ICD-10-CM | POA: Diagnosis not present

## 2019-04-24 DIAGNOSIS — M25661 Stiffness of right knee, not elsewhere classified: Secondary | ICD-10-CM | POA: Diagnosis not present

## 2019-04-24 DIAGNOSIS — M238X1 Other internal derangements of right knee: Secondary | ICD-10-CM | POA: Diagnosis not present

## 2019-04-24 DIAGNOSIS — M6281 Muscle weakness (generalized): Secondary | ICD-10-CM | POA: Diagnosis not present

## 2019-05-01 DIAGNOSIS — M25661 Stiffness of right knee, not elsewhere classified: Secondary | ICD-10-CM | POA: Diagnosis not present

## 2019-05-01 DIAGNOSIS — M6281 Muscle weakness (generalized): Secondary | ICD-10-CM | POA: Diagnosis not present

## 2019-05-01 DIAGNOSIS — M238X1 Other internal derangements of right knee: Secondary | ICD-10-CM | POA: Diagnosis not present

## 2019-05-01 DIAGNOSIS — M25561 Pain in right knee: Secondary | ICD-10-CM | POA: Diagnosis not present

## 2019-05-21 ENCOUNTER — Ambulatory Visit
Admission: EM | Admit: 2019-05-21 | Discharge: 2019-05-21 | Disposition: A | Discharge status: Home / Self Care | Payer: Medicare HMO | Attending: Family Medicine | Admitting: Family Medicine

## 2019-05-21 ENCOUNTER — Ambulatory Visit (INDEPENDENT_AMBULATORY_CARE_PROVIDER_SITE_OTHER): Payer: Medicare HMO

## 2019-05-21 ENCOUNTER — Other Ambulatory Visit: Payer: Self-pay

## 2019-05-21 ENCOUNTER — Encounter: Payer: Self-pay | Admitting: Emergency Medicine

## 2019-05-21 DIAGNOSIS — Y92009 Unspecified place in unspecified non-institutional (private) residence as the place of occurrence of the external cause: Secondary | ICD-10-CM | POA: Diagnosis not present

## 2019-05-21 DIAGNOSIS — W19XXXA Unspecified fall, initial encounter: Secondary | ICD-10-CM | POA: Diagnosis not present

## 2019-05-21 DIAGNOSIS — S6991XA Unspecified injury of right wrist, hand and finger(s), initial encounter: Secondary | ICD-10-CM | POA: Diagnosis not present

## 2019-05-21 DIAGNOSIS — M79641 Pain in right hand: Secondary | ICD-10-CM

## 2019-05-21 DIAGNOSIS — M25531 Pain in right wrist: Secondary | ICD-10-CM

## 2019-05-21 NOTE — ED Provider Notes (Signed)
[facility]    CSN: [id number] Arrival date & time: 05/21/19  1027      History   Chief Complaint Chief Complaint  Patient presents with  . Hand Injury  . Wrist Injury  . Knee Pain   HPI   44 year old female presents with the above complaints.  Patient states that she suffered a fall last night.  She states that she is unclear of exactly how and why she fell.  She states that she injured her right hand and wrist.  She also injured her left knee.  She reports mild bruising and mild swelling over the anterior knee.  She rates her pain as 7/10 in severity.  She is most concerned about her wrist and hand.  Decreased range of motion.  No relieving factors.  No other associated symptoms.  No other complaints.  Past Medical History:  Diagnosis Date  . Anemia   . Asthma   . Borderline personality disorder (HCC)   . Depression   . DVT of leg (deep venous thrombosis) (HCC)   . Family history of adverse reaction to anesthesia    Sister stopped breathing with versed  . Fibromyalgia   . May-Thurner syndrome   . Motion sickness    boats, back seat of car  . Presence of IVC filter   . PTSD (post-traumatic stress disorder)   . Wears dentures    full upper and lower    Patient Active Problem List   Diagnosis Date Noted  . Deep vein thrombosis (DVT) (HCC) 04/08/2015  . May-Thurner syndrome 04/08/2015  . Overdose 02/09/2015  . Ache in joint 11/21/2013  . Arteriovenous fistula (HCC) 06/20/2013  . Absolute anemia 10/19/2012  . Recurrent major depressive disorder, in partial remission (HCC) 07/13/2012  . Chronic pain 01/22/2012  . Clinical depression 01/22/2012  . Disease of lung 01/22/2012  . Fibromyalgia 01/22/2012  . LBP (low back pain) 01/22/2012  . Cannot sleep 01/22/2012  . Emotional neurotic disorder 01/22/2012  . Cannabis abuse, continuous 01/22/2012  . Pulmonary embolism with infarction (HCC) 01/22/2012  . Esophagitis, reflux 01/22/2012  . Current tobacco  use 01/22/2012  . Narcotic drug use 11/02/2010  . Post-phlebitic syndrome 07/05/2010  . Borderline personality disorder (HCC) 06/15/2010  . Chronic deep vein thrombosis (DVT) of lower extremity (HCC) 06/15/2010  . Hereditary and idiopathic neuropathy 06/15/2010  . Neurosis, posttraumatic 06/15/2010    Past Surgical History:  Procedure Laterality Date  . BREAST CYST ASPIRATION Right    neg  . CHOLECYSTECTOMY    . IVC FILTER PLACEMENT ([facility] HX)    . KNEE ARTHROSCOPY WITH OSTEOCHONDRAL DEFECT REPAIR Right 10/08/2018   Procedure: KNEE ARTHROSCOPY WITH MICROFRACTURE OF THE PATELLA, CHONDROPLASTY AND MACI BIOPSY;  Surgeon: Signa Kell, MD;  Location: [facility];  Service: Orthopedics;  Laterality: Right;  . LEG SURGERY    . TUBAL LIGATION      OB History    Gravida  3   Para  3   Term  2   Preterm  1   AB  0   Living  3     SAB  0   TAB  0   Ectopic  0   Multiple  0   Live Births               Home Medications    Prior to Admission medications   Medication Sig Start Date End Date Taking? Authorizing Provider  acetaminophen (TYLENOL) 500 MG tablet Take 2 tablets (1,000  mg total) by mouth every 8 (eight) hours. 10/08/18 10/08/19 Yes Signa Kell, MD  albuterol (PROVENTIL HFA;VENTOLIN HFA) 108 (90 Base) MCG/ACT inhaler Inhale 1-2 puffs into the lungs every 6 (six) hours as needed for wheezing or shortness of breath (Patient does not use but is active prescription).   Yes [provider]  clonazePAM (KLONOPIN) 0.5 MG tablet Take 0.5 mg by mouth 2 (two) times daily.   Yes [provider]  cyclobenzaprine (FLEXERIL) 10 MG tablet Take 10 mg by mouth 4 (four) times daily as needed for muscle spasms.   Yes [provider]  Fluticasone-Salmeterol (ADVAIR) 250-50 MCG/DOSE AEPB Inhale 1 puff into the lungs 2 (two) times daily.   Yes [provider]  gabapentin (NEURONTIN) 800 MG tablet Take 800 mg by mouth 4 (four) times daily.   Yes  [provider]  levocetirizine (XYZAL) 5 MG tablet Take 5 mg by mouth every evening.   Yes [provider]  mirtazapine (REMERON) 30 MG tablet Take 1 tablet (30 mg total) by mouth at bedtime. 04/08/15  Yes Ravi, Himabindu, MD  montelukast (SINGULAIR) 10 MG tablet Take 10 mg by mouth at bedtime.   Yes [provider]  Multiple Vitamins-Calcium (ONE-A-DAY WOMENS FORMULA PO) Take by mouth daily.   Yes [provider]  rivaroxaban (XARELTO) 20 MG TABS tablet Take 20 mg by mouth daily.   Yes [provider]  rOPINIRole (REQUIP) 1 MG tablet Take 1 mg by mouth at bedtime.   Yes [provider]  tiotropium (SPIRIVA) 18 MCG inhalation capsule Place 18 mcg into inhaler and inhale daily. Active prescription, patient just doesn't use   Yes [provider]    Family History Family History  Problem Relation Age of Onset  . Schizophrenia Mother   . Drug abuse Mother   . Heart defect Father   . Heart Problems Father   . Drug abuse Father   . Bipolar disorder Sister   . Bipolar disorder Maternal Uncle   . Schizophrenia Maternal Uncle   . Breast cancer Paternal Aunt   . Breast cancer Maternal Grandmother   . Breast cancer Paternal Grandmother     Social History Social History   Tobacco Use  . Smoking status: Current Every Day Smoker    Packs/day: 0.50    Years: 30.00    Pack years: 15.00    Types: Cigarettes    Start date: 04/07/1988  . Smokeless tobacco: Never Used  . Tobacco comment: since age 27  Substance Use Topics  . Alcohol use: No    Alcohol/week: 0.0 standard drinks  . Drug use: No    Types: Marijuana    Comment: last used a few months ago     Allergies   Percocet [oxycodone-acetaminophen], Adhesive [tape], Amitriptyline, Hydrocodone, Vicodin [hydrocodone-acetaminophen], Fish allergy, Iodine, Keflex [cephalexin], Levaquin [levofloxacin in d5w], and Levofloxacin   Review of Systems Review of Systems   Constitutional: Negative.   Musculoskeletal:       Right hand and wrist pain. Left knee pain.   Physical Exam Triage Vital Signs ED Triage Vitals  Enc Vitals Group     BP 05/21/19 1108 93/79     Pulse Rate 05/21/19 1108 71     Resp 05/21/19 1108 18     Temp 05/21/19 1108 98.2 F (36.8 C)     Temp Source 05/21/19 1108 Oral     SpO2 05/21/19 1108 100 %     Weight 05/21/19 1106 159 lb (72.1 kg)  Height 05/21/19 1106 5\' 3"  (1.6 m)     Head Circumference --      Peak Flow --      Pain Score 05/21/19 1105 7     Pain Loc --      Pain Edu? --      Excl. in [place]? --    Updated Vital Signs BP 93/79 (BP Location: Right Arm)   Pulse 71   Temp 98.2 F (36.8 C) (Oral)   Resp 18   Ht 5\' 3"  (1.6 m)   Wt 72.1 kg   SpO2 100%   BMI 28.17 kg/m   Visual Acuity Right Eye Distance:   Left Eye Distance:   Bilateral Distance:    Right Eye Near:   Left Eye Near:    Bilateral Near:     Physical Exam Vitals and nursing note reviewed.  Constitutional:      General: She is not in acute distress.    Appearance: Normal appearance. She is not ill-appearing.  HENT:     Head: Normocephalic and atraumatic.  Eyes:     General:        Right eye: No discharge.        Left eye: No discharge.     Conjunctiva/sclera: Conjunctivae normal.  Cardiovascular:     Rate and Rhythm: Normal rate and regular rhythm.     Heart sounds: No murmur.  Pulmonary:     Effort: Pulmonary effort is normal.     Breath sounds: Normal breath sounds. No wheezing, rhonchi or rales.  Musculoskeletal:     Comments: Left knee -small area of bruising and mild swelling anteriorly.  Right hand and wrist -patient with tenderness over the dorsum of the right wrist and proximal hand.  Decreased range of motion secondary to pain.  Neurological:     Mental Status: She is alert.  Psychiatric:        Mood and Affect: Mood normal.        Behavior: Behavior normal.    UC Treatments / Results  Labs (all labs ordered are  listed, but only abnormal results are displayed) Labs Reviewed - No data to display  EKG   Radiology DG Wrist Complete Right  Result Date: 05/21/2019 CLINICAL DATA:  Pain following fall EXAM: RIGHT WRIST - COMPLETE 3+ VIEW COMPARISON:  None. FINDINGS: Frontal, oblique, lateral, and ulnar deviation scaphoid images were obtained. No fracture or dislocation. Joint spaces appear normal. No erosive change. IMPRESSION: No fracture or dislocation.  No evident arthropathy. Electronically Signed   By: Lowella Grip III M.D.   On: 05/21/2019 11:40   DG Hand Complete Right  Result Date: 05/21/2019 CLINICAL DATA:  Pain following fall EXAM: RIGHT HAND - COMPLETE 3+ VIEW COMPARISON:  None. FINDINGS: Frontal, oblique, and lateral views were obtained. No fracture or dislocation. Joint spaces appear normal. No erosive change. IMPRESSION: No fracture or dislocation.  No evident arthropathy. Electronically Signed   By: Lowella Grip III M.D.   On: 05/21/2019 11:41    Procedures Procedures (including critical care time)  Medications Ordered in UC Medications - No data to display  Initial Impression / Assessment and Plan / UC Course  I have reviewed the triage vital signs and the nursing notes.  Pertinent labs & imaging results that were available during my care of the patient were reviewed by me and considered in my medical decision making (see chart for details).    44 year old female presents with right hand and right wrist pain  after suffering a fall.  Patient also endorsing left knee pain.  X-rays obtained of the right wrist and hand and were negative.  No x-ray needed of the left knee as it is simply soft tissue contusion.  No NSAIDs were prescribed today due to Xarelto use.  Tylenol as directed.  Rest, ice, elevation.  Supportive care.  Final Clinical Impressions(s) / UC Diagnoses   Final diagnoses:  Pain of right hand  Right wrist pain  Fall in home, initial encounter     Discharge  Instructions     Rest, ice, elevation.  Tylenol 1000 mg three times daily as needed.  Take care  Dr. Adriana Simas    ED Prescriptions    None     PDMP not reviewed this encounter.   Tommie Sams, DO 05/21/19 1210

## 2019-05-21 NOTE — ED Triage Notes (Addendum)
Patient states she fell last night injuring her right hand and wrist area. She is also c/o left knee pain, bruising and swelling.

## 2019-05-21 NOTE — Discharge Instructions (Signed)
Rest, ice, elevation.  Tylenol 1000 mg three times daily as needed.  Take care  Dr. Dmya Long  

## 2019-08-07 ENCOUNTER — Other Ambulatory Visit: Payer: Self-pay | Admitting: Orthopedic Surgery

## 2019-08-07 DIAGNOSIS — M25561 Pain in right knee: Secondary | ICD-10-CM

## 2019-10-10 ENCOUNTER — Other Ambulatory Visit: Payer: Self-pay | Admitting: Orthopedic Surgery

## 2019-10-10 DIAGNOSIS — G8929 Other chronic pain: Secondary | ICD-10-CM

## 2019-10-21 ENCOUNTER — Other Ambulatory Visit: Payer: Self-pay | Admitting: Family Medicine

## 2019-10-21 ENCOUNTER — Emergency Department
Admission: EM | Admit: 2019-10-21 | Discharge: 2019-10-22 | Disposition: A | Discharge status: Home / Self Care | Payer: Medicare HMO | Attending: Emergency Medicine | Admitting: Emergency Medicine

## 2019-10-21 ENCOUNTER — Other Ambulatory Visit: Payer: Self-pay

## 2019-10-21 DIAGNOSIS — Z1231 Encounter for screening mammogram for malignant neoplasm of breast: Secondary | ICD-10-CM

## 2019-10-21 DIAGNOSIS — X118XXA Contact with other hot tap-water, initial encounter: Secondary | ICD-10-CM | POA: Insufficient documentation

## 2019-10-21 DIAGNOSIS — F17210 Nicotine dependence, cigarettes, uncomplicated: Secondary | ICD-10-CM | POA: Diagnosis not present

## 2019-10-21 DIAGNOSIS — Y9389 Activity, other specified: Secondary | ICD-10-CM | POA: Insufficient documentation

## 2019-10-21 DIAGNOSIS — Z79899 Other long term (current) drug therapy: Secondary | ICD-10-CM | POA: Insufficient documentation

## 2019-10-21 DIAGNOSIS — Y998 Other external cause status: Secondary | ICD-10-CM | POA: Diagnosis not present

## 2019-10-21 DIAGNOSIS — Z7901 Long term (current) use of anticoagulants: Secondary | ICD-10-CM | POA: Insufficient documentation

## 2019-10-21 DIAGNOSIS — T24212A Burn of second degree of left thigh, initial encounter: Secondary | ICD-10-CM | POA: Diagnosis not present

## 2019-10-21 DIAGNOSIS — T2125XA Burn of second degree of buttock, initial encounter: Secondary | ICD-10-CM | POA: Diagnosis not present

## 2019-10-21 DIAGNOSIS — T22211A Burn of second degree of right forearm, initial encounter: Secondary | ICD-10-CM | POA: Insufficient documentation

## 2019-10-21 DIAGNOSIS — Y9289 Other specified places as the place of occurrence of the external cause: Secondary | ICD-10-CM | POA: Diagnosis not present

## 2019-10-21 DIAGNOSIS — J45909 Unspecified asthma, uncomplicated: Secondary | ICD-10-CM | POA: Diagnosis not present

## 2019-10-21 DIAGNOSIS — T2220XA Burn of second degree of shoulder and upper limb, except wrist and hand, unspecified site, initial encounter: Secondary | ICD-10-CM

## 2019-10-21 LAB — CBC WITH DIFFERENTIAL/PLATELET
Abs Immature Granulocytes: 0.02 10*3/uL (ref 0.00–0.07)
Basophils Absolute: 0.1 10*3/uL (ref 0.0–0.1)
Basophils Relative: 1 %
Eosinophils Absolute: 0.3 10*3/uL (ref 0.0–0.5)
Eosinophils Relative: 4 %
HCT: 35.9 % — ABNORMAL LOW (ref 36.0–46.0)
Hemoglobin: 12.5 g/dL (ref 12.0–15.0)
Immature Granulocytes: 0 %
Lymphocytes Relative: 50 %
Lymphs Abs: 4.2 10*3/uL — ABNORMAL HIGH (ref 0.7–4.0)
MCH: 32.6 pg (ref 26.0–34.0)
MCHC: 34.8 g/dL (ref 30.0–36.0)
MCV: 93.5 fL (ref 80.0–100.0)
Monocytes Absolute: 0.4 10*3/uL (ref 0.1–1.0)
Monocytes Relative: 5 %
Neutro Abs: 3.3 10*3/uL (ref 1.7–7.7)
Neutrophils Relative %: 40 %
Platelets: 319 10*3/uL (ref 150–400)
RBC: 3.84 MIL/uL — ABNORMAL LOW (ref 3.87–5.11)
RDW: 12.9 % (ref 11.5–15.5)
Smear Review: NORMAL
WBC: 8.3 10*3/uL (ref 4.0–10.5)
nRBC: 0 % (ref 0.0–0.2)

## 2019-10-21 LAB — COMPREHENSIVE METABOLIC PANEL
ALT: 15 U/L (ref 0–44)
AST: 25 U/L (ref 15–41)
Albumin: 4.3 g/dL (ref 3.5–5.0)
Alkaline Phosphatase: 55 U/L (ref 38–126)
Anion gap: 10 (ref 5–15)
BUN: 7 mg/dL (ref 6–20)
CO2: 25 mmol/L (ref 22–32)
Calcium: 9.2 mg/dL (ref 8.9–10.3)
Chloride: 100 mmol/L (ref 98–111)
Creatinine, Ser: 0.93 mg/dL (ref 0.44–1.00)
GFR calc Af Amer: >60 mL/min (ref >60)
GFR calc non Af Amer: >60 mL/min (ref >60)
Glucose, Bld: 105 mg/dL — ABNORMAL HIGH (ref 70–99)
Potassium: 4 mmol/L (ref 3.5–5.1)
Sodium: 135 mmol/L (ref 135–145)
Total Bilirubin: 0.5 mg/dL (ref 0.3–1.2)
Total Protein: 6.7 g/dL (ref 6.5–8.1)

## 2019-10-21 MED ORDER — HYDROMORPHONE HCL 2 MG PO TABS: 2.0000 mg | ORAL_TABLET | Freq: Two times a day (BID) | ORAL | 0 refills | Status: AC | PRN

## 2019-10-21 MED ORDER — HYDROMORPHONE HCL 1 MG/ML IJ SOLN
0.5000 mg | Freq: Once | INTRAMUSCULAR | Status: AC
  Administered 2019-10-21: 0.5 mg via INTRAVENOUS
  Filled 2019-10-21: qty 1

## 2019-10-21 MED ORDER — SILVER SULFADIAZINE 1 % EX CREA
TOPICAL_CREAM | Freq: Once | CUTANEOUS | Status: AC
  Filled 2019-10-21 (×2): qty 85

## 2019-10-21 MED ORDER — ONDANSETRON HCL 4 MG/2ML IJ SOLN
4.0000 mg | Freq: Once | INTRAMUSCULAR | Status: AC
  Administered 2019-10-21: 4 mg via INTRAVENOUS
  Filled 2019-10-21: qty 2
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
## 2019-10-21 NOTE — Discharge Instructions (Addendum)
Return tomorrow for recheck.  Use the Dilaudid 1 pill twice a day as needed for pain.  Return also for increasing pain redness or swelling or any other problems like fever.
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
## 2019-10-22 DIAGNOSIS — T22211A Burn of second degree of right forearm, initial encounter: Secondary | ICD-10-CM | POA: Diagnosis not present

## 2019-10-22 NOTE — ED Notes (Signed)
Silvadene applied with vaseline gauze, dry gauze, and foam tape applied to pt's bottom. Pt fiance taking pt home. AO x4.

## 2019-11-03 ENCOUNTER — Other Ambulatory Visit: Payer: Self-pay

## 2019-11-03 ENCOUNTER — Ambulatory Visit
Admission: RE | Admit: 2019-11-03 | Discharge: 2019-11-03 | Disposition: A | Discharge status: Home / Self Care | Payer: Medicare HMO | Source: Ambulatory Visit | Attending: Family Medicine | Admitting: Family Medicine

## 2019-11-03 DIAGNOSIS — Z1231 Encounter for screening mammogram for malignant neoplasm of breast: Secondary | ICD-10-CM | POA: Insufficient documentation

## 2019-11-07 ENCOUNTER — Ambulatory Visit
Admission: RE | Admit: 2019-11-07 | Discharge: 2019-11-07 | Disposition: A | Discharge status: Home / Self Care | Payer: Medicare HMO | Source: Ambulatory Visit | Attending: Orthopedic Surgery | Admitting: Orthopedic Surgery

## 2019-11-07 ENCOUNTER — Other Ambulatory Visit: Payer: Self-pay

## 2019-11-07 DIAGNOSIS — G8929 Other chronic pain: Secondary | ICD-10-CM | POA: Insufficient documentation

## 2019-11-07 DIAGNOSIS — M25561 Pain in right knee: Secondary | ICD-10-CM | POA: Insufficient documentation

## 2021-01-11 IMAGING — MG DIGITAL SCREENING BILATERAL MAMMOGRAM WITH TOMO AND CAD
6 of 12 series · 6 of 36 positions shown · non-contrast
Comparison: Previous exam(s).

CLINICAL DATA: Screening.

EXAM:
DIGITAL SCREENING BILATERAL MAMMOGRAM WITH TOMO AND CAD

[R MLO synth-2D (1 of 2)]
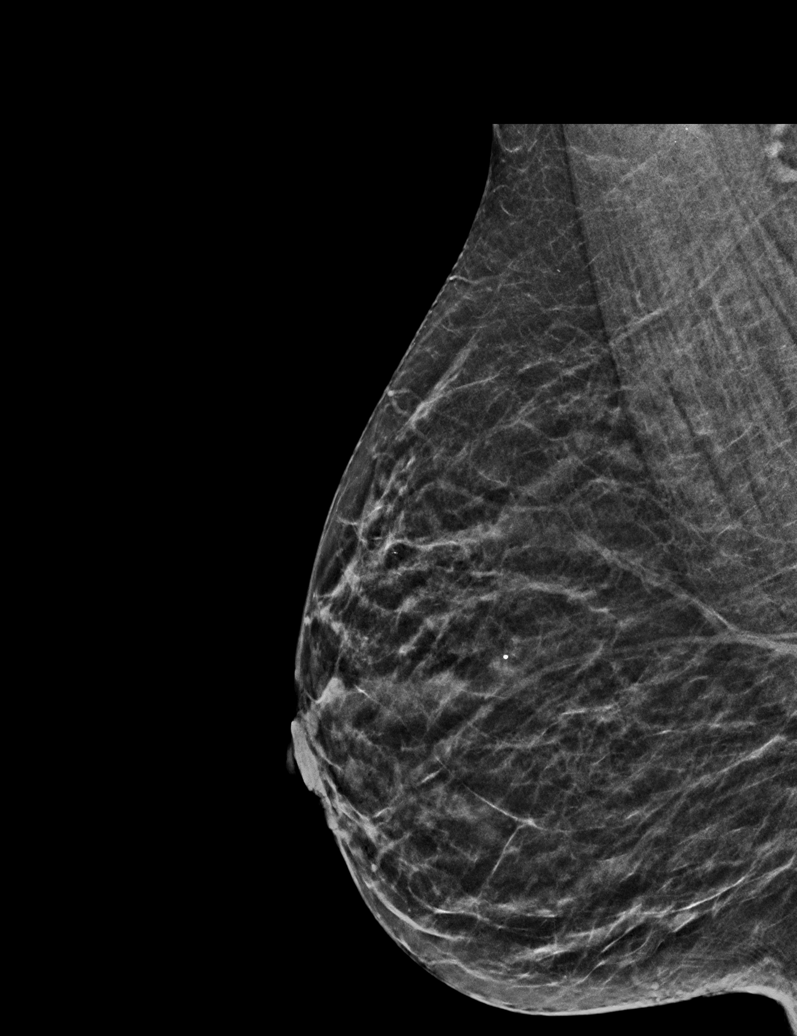

[R CC synth-2D]
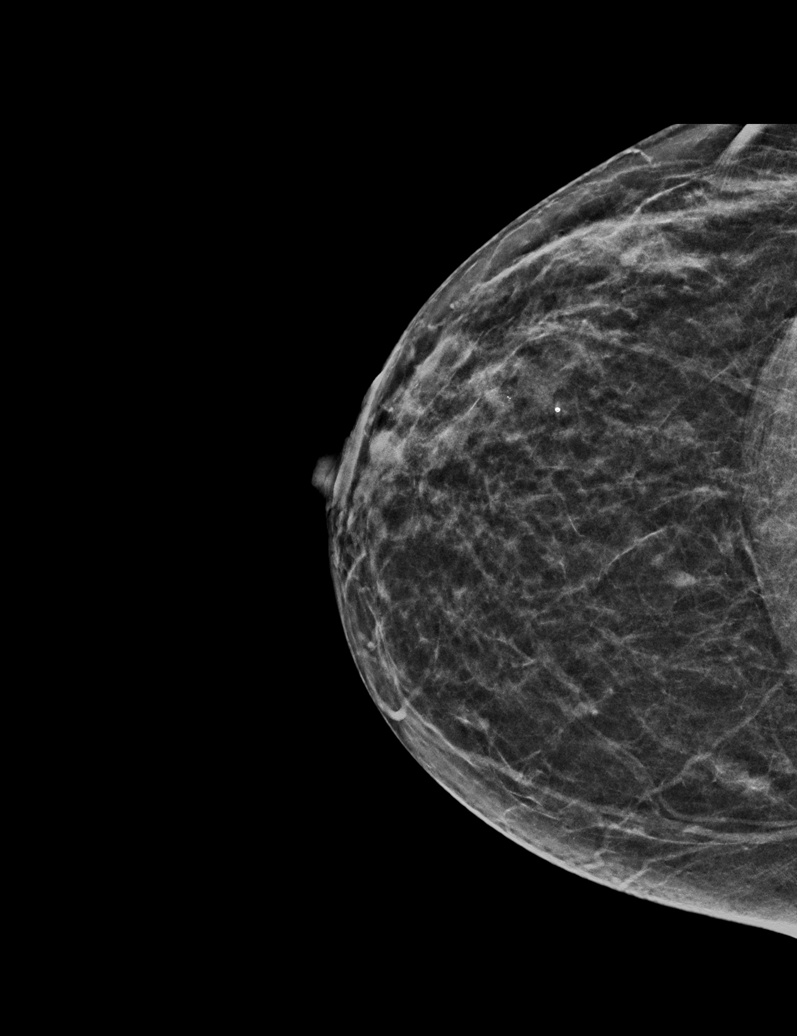

[L CC synth-2D]
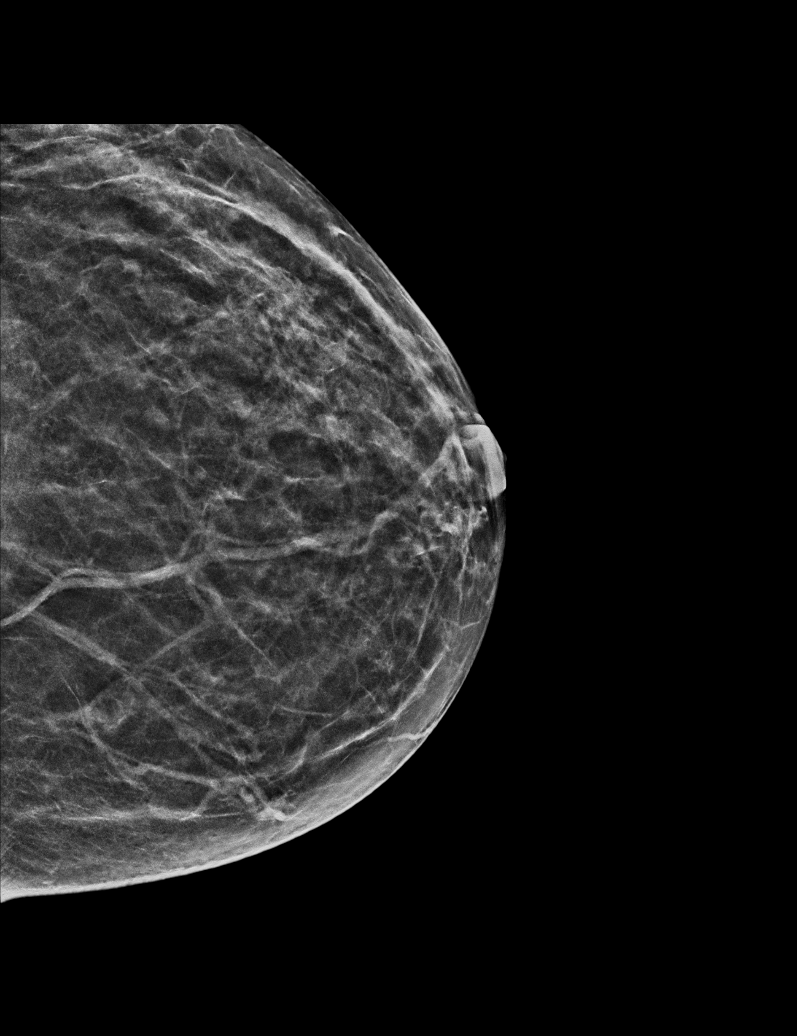

[R MLO synth-2D (2 of 2)]
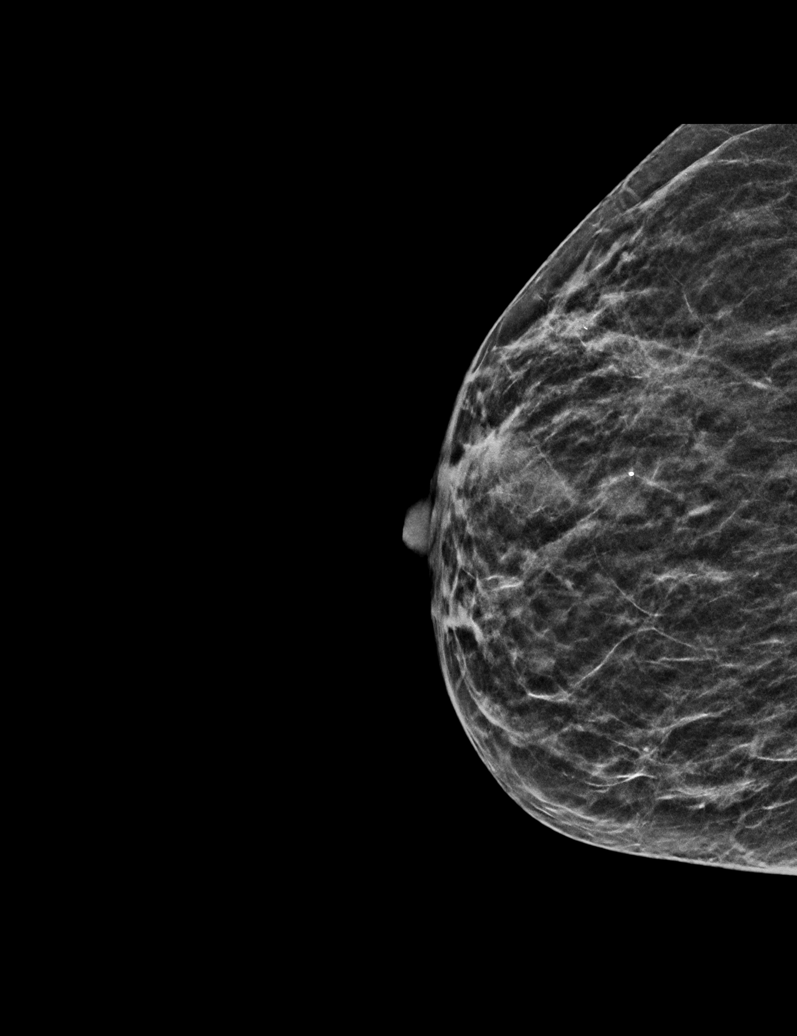

[L MLO synth-2D (1 of 2)]
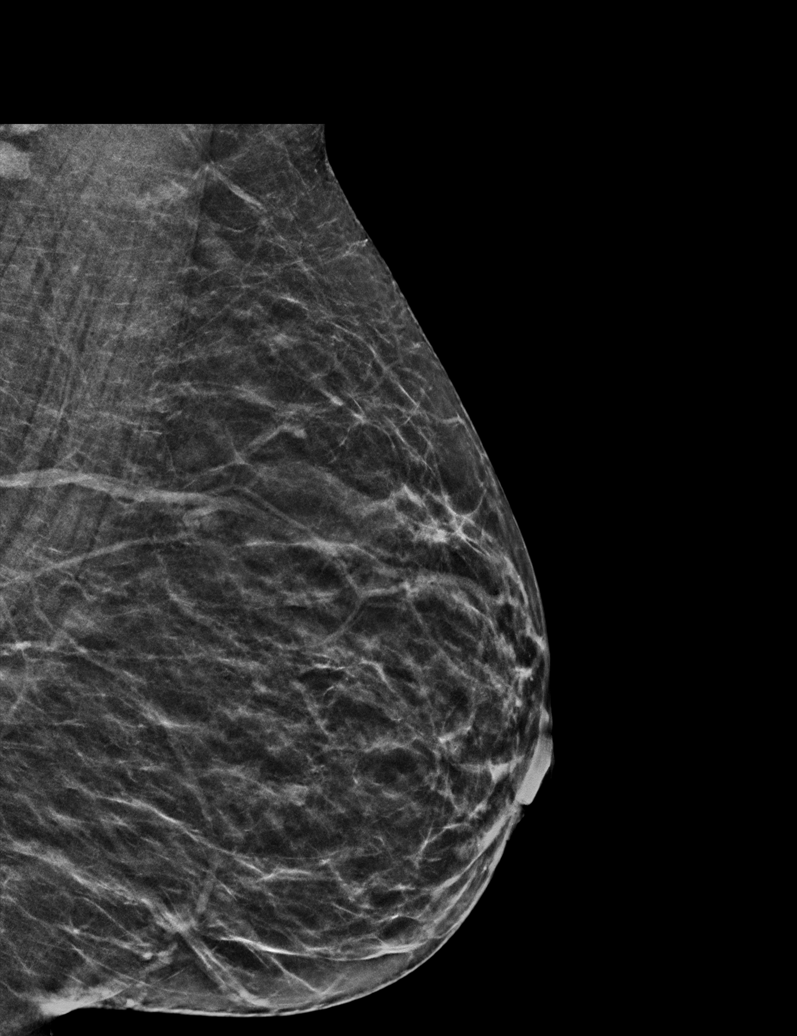

[L MLO synth-2D (2 of 2)]
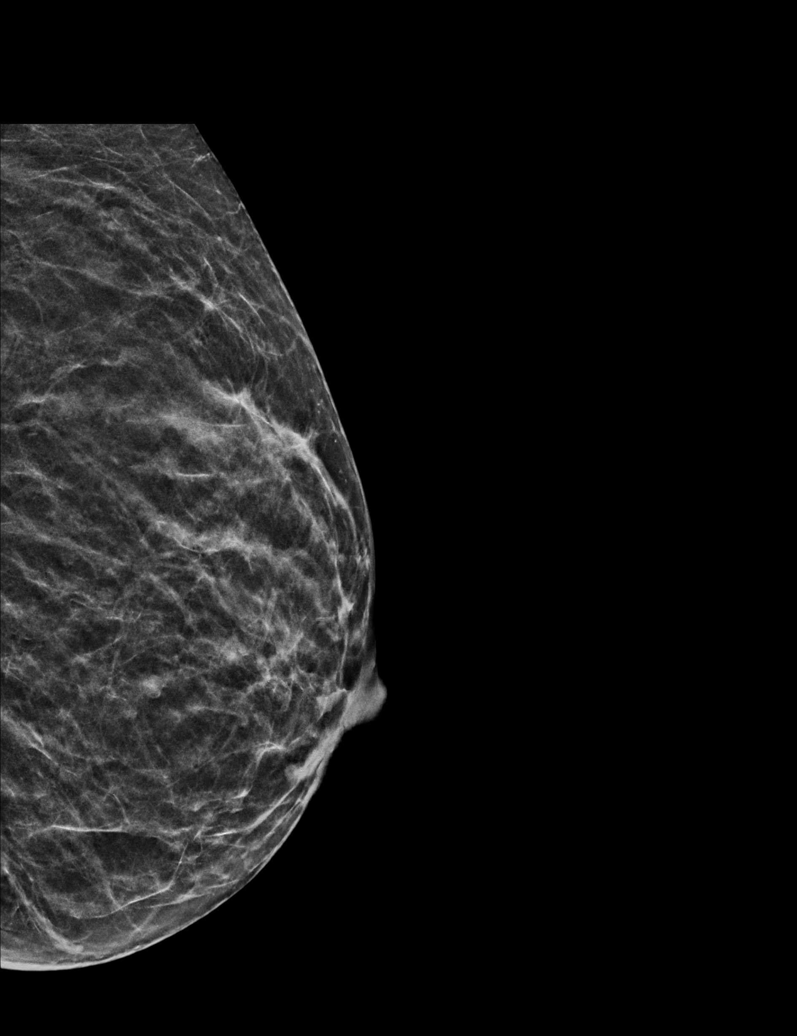

[6 of 36 positions shown; findings below may reference images not displayed]

ACR Breast Density Category b: There are scattered areas of
fibroglandular density.
FINDINGS: There are no findings suspicious for malignancy. Images were
processed with CAD.
IMPRESSION: No mammographic evidence of malignancy. A result letter of this
screening mammogram will be mailed directly to the patient.

RECOMMENDATION:
Screening mammogram in one year. (Code:CN-[id number])

BI-RADS CATEGORY  1: Negative.

## 2021-05-17 ENCOUNTER — Ambulatory Visit (LOCAL_COMMUNITY_HEALTH_CENTER): Payer: Medicare Other

## 2021-05-17 DIAGNOSIS — Z23 Encounter for immunization: Secondary | ICD-10-CM

## 2021-05-17 DIAGNOSIS — Z719 Counseling, unspecified: Secondary | ICD-10-CM

## 2021-05-17 NOTE — Progress Notes (Signed)
?  Are you feeling sick today? No ? ? ?Have you ever received a dose of COVID-19 Vaccine? [employer], [place], [place], [place], Other) Yes ? ?If yes, which vaccine and how many doses?   Moderna 05/03/21 ? ? ?Did you bring the vaccination record card or other documentation?  Yes ? ? ?Do you have a health condition or are undergoing treatment that makes you moderately or severely immunocompromised? This would include, but not be limited to: cancer, HIV, organ transplant, immunosuppressive therapy/high-dose corticosteroids, or moderate/severe primary immunodeficiency.  No ? ?Have you received COVID-19 vaccine before or during hematopoietic cell transplant (HCT) or CAR-T-cell therapies? No ? ?Have you ever had an allergic reaction to: (This would include a severe allergic reaction or a reaction that caused hives, swelling, or respiratory distress, including wheezing.) A component of a COVID-19 vaccine or a previous dose of COVID-19 vaccine? No ? ? ?Have you ever had an allergic reaction to another vaccine (other thanCOVID-19 vaccine) or an injectable medication? (This would include a severe allergic reaction or a reaction that caused hives, swelling, or respiratory distress, including wheezing.)   No ?  ?Do you have a history of any of the following: ? ?Myocarditis or Pericarditis No ?Thrombosis with thrombocytopenia syndrome (TTS) No ?Multisystem Inflammatory Syndrome (MIS-C or MIS-A)? No ?Immune-mediate syndrome defined by thrombosis and thrombocytopenia, such as heparin--induced thrombocytopenia (HIT)  No ?Guillain-Barr? Syndrome (GBS) No ?COVID-19 disease within the past 3 months? Yes ?Vaccinated with monkeypox vaccine in the last 4 weeks? No ? ?Patient needed vaccination for work.  Explained reason for Pfizer as 2nd primary dose versus Moderna (unavailability). Explained the recommendation for 8 weeks between vaccinations and also waiting discussed waiting 3 months after COVID.  Patient had COVID in 03/2021.  Patient  wanted vaccine today due to work. Provided copy of Pfizer information sheet.  [facility] updated and copy provided to patient. COVID card updated for patient. Patient waited for approximately 10 minutes after injection before leaving.No reaction and tolerated vaccination well.  ?  ?

## 2021-08-17 ENCOUNTER — Emergency Department
Admission: EM | Admit: 2021-08-17 | Discharge: 2021-08-17 | Disposition: A | Discharge status: Home / Self Care | Payer: Medicare Other | Attending: Emergency Medicine | Admitting: Emergency Medicine

## 2021-08-17 ENCOUNTER — Encounter: Payer: Self-pay | Admitting: Intensive Care

## 2021-08-17 ENCOUNTER — Emergency Department: Payer: Medicare Other

## 2021-08-17 ENCOUNTER — Other Ambulatory Visit: Payer: Self-pay

## 2021-08-17 DIAGNOSIS — Z7901 Long term (current) use of anticoagulants: Secondary | ICD-10-CM | POA: Insufficient documentation

## 2021-08-17 DIAGNOSIS — M545 Low back pain, unspecified: Secondary | ICD-10-CM | POA: Diagnosis not present

## 2021-08-17 DIAGNOSIS — J45909 Unspecified asthma, uncomplicated: Secondary | ICD-10-CM | POA: Diagnosis not present

## 2021-08-17 DIAGNOSIS — M549 Dorsalgia, unspecified: Secondary | ICD-10-CM | POA: Diagnosis present

## 2021-08-17 MED ORDER — IBUPROFEN 400 MG PO TABS
400.0000 mg | ORAL_TABLET | Freq: Once | ORAL | Status: DC
  Filled 2021-08-17: qty 1

## 2021-08-17 MED ORDER — LIDOCAINE 5 % EX PTCH
1.0000 | MEDICATED_PATCH | CUTANEOUS | Status: DC
  Administered 2021-08-17: 1 via TRANSDERMAL
  Filled 2021-08-17: qty 1

## 2021-08-17 MED ORDER — LIDOCAINE 5 % EX PTCH: 1.0000 | MEDICATED_PATCH | CUTANEOUS | 0 refills | Status: AC

## 2021-08-17 MED ORDER — OXYCODONE-ACETAMINOPHEN 5-325 MG PO TABS
1.0000 | ORAL_TABLET | Freq: Once | ORAL | Status: AC
  Administered 2021-08-17: 1 via ORAL
  Filled 2021-08-17: qty 1

## 2021-08-17 NOTE — ED Provider Notes (Signed)
[facility] Provider Note    Event Date/Time   First MD Initiated Contact with Patient 08/17/21 1239     (approximate)   History   Fall   HPI  Lindsey Willis is a 46 y.o. female   with pmh of asthma, DVT on Eliquis, borderline personality disorder, PTSD, chronic regional pain syndrome who presents with back pain.  About 2 weeks ago patient was sitting in her wheelchair when she fell backwards onto her butt.  Did not hit her head.  Has had pain right above her gluteal cleft since that time.  She feels like because of the injury her chronic regional pain syndrome has moved to that area.  She takes Flexeril for pain     Past Medical History:  Diagnosis Date   Anemia    Asthma    Borderline personality disorder (HCC)    Depression    DVT of leg (deep venous thrombosis) (HCC)    Family history of adverse reaction to anesthesia    Sister stopped breathing with versed   Fibromyalgia    May-Thurner syndrome    Motion sickness    boats, back seat of car   Presence of IVC filter    PTSD (post-traumatic stress disorder)    Wears dentures    full upper and lower    Patient Active Problem List   Diagnosis Date Noted   Deep vein thrombosis (DVT) (HCC) 04/08/2015   May-Thurner syndrome 04/08/2015   Overdose 02/09/2015   Ache in joint 11/21/2013   Arteriovenous fistula (HCC) 06/20/2013   Absolute anemia 10/19/2012   Recurrent major depressive disorder, in partial remission (HCC) 07/13/2012   Chronic pain 01/22/2012   Clinical depression 01/22/2012   Disease of lung 01/22/2012   Fibromyalgia 01/22/2012   LBP (low back pain) 01/22/2012   Cannot sleep 01/22/2012   Emotional neurotic disorder 01/22/2012   Cannabis abuse, continuous 01/22/2012   Pulmonary embolism with infarction (HCC) 01/22/2012   Esophagitis, reflux 01/22/2012   Current tobacco use 01/22/2012   Narcotic drug use 11/02/2010   Post-phlebitic syndrome 07/05/2010   Borderline  personality disorder (HCC) 06/15/2010   Chronic deep vein thrombosis (DVT) of lower extremity (HCC) 06/15/2010   Hereditary and idiopathic neuropathy 06/15/2010   Neurosis, posttraumatic 06/15/2010     Physical Exam  Triage Vital Signs: ED Triage Vitals  Enc Vitals Group     BP 08/17/21 1137 111/68     Pulse Rate 08/17/21 1137 76     Resp 08/17/21 1137 17     Temp 08/17/21 1137 98.4 F (36.9 C)     Temp Source 08/17/21 1137 Oral     SpO2 08/17/21 1137 100 %     Weight 08/17/21 1140 179 lb (81.2 kg)     Height 08/17/21 1140 5\' 3"  (1.6 m)     Head Circumference --      Peak Flow --      Pain Score 08/17/21 1140 8     Pain Loc --      Pain Edu? --      Excl. in GC? --     Most recent vital signs: Vitals:   08/17/21 1137  BP: 111/68  Pulse: 76  Resp: 17  Temp: 98.4 F (36.9 C)  SpO2: 100%     General: Awake, no distress.  CV:  Good peripheral perfusion.  Resp:  Normal effort.  Abd:  No distention.  Neuro:  Awake, Alert, Oriented x 3  Other:  Patient is presently tender to just light touch over the gluteal cleft there is no overlying erythema or fluctuance no step-offs Normal range of motion of the hips 5 out of 5 strength with plantarflexion dorsiflexion   ED Results / Procedures / Treatments  Labs (all labs ordered are listed, but only abnormal results are displayed) Labs Reviewed - No data to display   EKG     RADIOLOGY X-ray of the sacrum/coccyx interpreted myself negative for fracture   PROCEDURES:  Critical Care performed: No  Procedures   MEDICATIONS ORDERED IN ED: Medications  lidocaine (LIDODERM) 5 % 1 patch (1 patch Transdermal Patch Applied 08/17/21 1343)  ibuprofen (ADVIL) tablet 400 mg (400 mg Oral Patient Refused/Not Given 08/17/21 1347)  oxyCODONE-acetaminophen (PERCOCET/ROXICET) 5-325 MG per tablet 1 tablet (1 tablet Oral Given 08/17/21 1343)     IMPRESSION / MDM / ASSESSMENT AND PLAN / ED COURSE  I reviewed the triage  vital signs and the nursing notes.                              Patient's presentation is most consistent with acute complicated illness / injury requiring diagnostic workup.  Differential diagnosis includes, but is not limited to, contusion, exacerbation of complex regional pain syndrome, sacral/coccyx fracture  Patient is a 46 year old female with past medical history of complex regional pain syndrome presenting with pain over the tailbone after a fall 2 weeks ago.  Has been able to ambulate no numbness tingling weakness bowel bladder incontinence or red flag symptoms for cauda equina syndrome.  On exam she is quite tender for me just touching the skin.  There is really no focal tenderness swelling erythema.  Suspect an exacerbation of her pain syndrome.  She has full strength in her extremities.  X-ray of the coccyx was obtained which does not show any fracture.  She was given Lidoderm patch and Percocet and was sleeping comfortably after this.  Will discharge with Lidoderm patch.  Advise she follow-up with her primary care doctor.       FINAL CLINICAL IMPRESSION(S) / ED DIAGNOSES   Final diagnoses:  Acute low back pain, unspecified back pain laterality, unspecified whether sciatica present     Rx / DC Orders   ED Discharge Orders          Ordered    lidocaine (LIDODERM) 5 %  Every 24 hours        08/17/21 1505             Note:  This document was prepared using Dragon voice recognition software and may include unintentional dictation errors.   Georga Hacking, MD 08/17/21 [phone]

## 2021-08-17 NOTE — ED Notes (Signed)
See triage note. Pt taken to room in wheelchair. Pt states she fell two weeks ago and coccyx pain has gotten worse

## 2021-08-17 NOTE — ED Triage Notes (Signed)
Patient reports fall X2 weeks ago. Reports tailbone pain that has gotten progressively worse.

## 2021-08-17 NOTE — Discharge Instructions (Addendum)
Your x-ray did not show any broken bones.  You likely bruised the area.  You can use the Lidoderm patch.  Also recommend taking Tylenol.  Please follow-up with your primary care doctor if pain continues.

## 2023-05-01 ENCOUNTER — Other Ambulatory Visit: Payer: Self-pay | Admitting: Family Medicine

## 2023-05-01 DIAGNOSIS — Z1231 Encounter for screening mammogram for malignant neoplasm of breast: Secondary | ICD-10-CM

## 2023-09-03 NOTE — Progress Notes (Signed)
 Chief Complaint:   Chief Complaint  Patient presents with  . Visit Follow Up    Subjective  Lindsey Willis is a 48 y.o. female who presents for Visit Follow Up HPI History of Present Illness Lindsey Willis is a 48 year old female who presents for follow-up after an ultrasound for vascular issues.  Her legs still get tired easily, but she can walk further than before. She avoids pushing her limits and notes that the swelling in her legs has decreased significantly.  She is currently taking Xarelto and uses compression socks as needed. She also applies lotion to maintain her skin condition and is diligent about her medication regimen.  Review of Systems  Patient Active Problem List  Diagnosis  . May-Thurner syndrome  . Fibromyalgia  . Asthma, moderate persistent, well-controlled (HHS-HCC)  . Idiopathic peripheral neuropathy  . Anxiety  . Moderate episode of recurrent major depressive disorder (CMS/HHS-HCC)  . RLS (restless legs syndrome)  . Chronic bilateral low back pain with left-sided sciatica  . Post-phlebitic syndrome  . Inferior vena cava occlusion (CMS/HHS-HCC)  . Chronic anticoagulation  . Acute embolism and thrombosis of unspecified deep veins of unspecified lower extremity (CMS/HHS-HCC)  . Anemia, unspecified  . Arteriovenous fistula, acquired ()  . Borderline personality disorder (CMS/HHS-HCC)  . Cannabis abuse, uncomplicated  . Chronic embolism and thrombosis of unspecified deep veins of unspecified lower extremity (CMS/HHS-HCC)  . Gastro-esophageal reflux disease with esophagitis  . Insomnia, unspecified  . Major depressive disorder, recurrent, in partial remission ()  . Narcotic drug use  . Other pulmonary embolism without acute cor pulmonale (CMS/HHS-HCC)  . Overdose  . Chronic right hip pain  . Patient has active power of attorney for health care  . Post-traumatic stress disorder, unspecified  . Chronic pain  . Seasonal allergies  . Prediabetes     Outpatient Medications Prior to Visit  Medication Sig Dispense Refill  . albuterol  MDI, PROVENTIL , VENTOLIN , PROAIR , HFA 90 mcg/actuation inhaler Inhale 1 Puff into the lungs every 4 (four) hours as needed for Wheezing 1 each 11  . cyclobenzaprine (FLEXERIL) 10 MG tablet Take 1 tablet (10 mg total) by mouth at bedtime as needed for Muscle spasms 90 tablet 3  . ERGOCALCIFEROL, VITAMIN D2, (VITAMIN D3 ORAL) Take 1,000 Units by mouth once daily.    SABRA gabapentin (NEURONTIN) 800 MG tablet Take 1 tablet (800 mg total) by mouth 4 (four) times daily 360 tablet 3  . rivaroxaban (XARELTO) 20 mg tablet Take 1 tablet (20 mg total) by mouth daily with breakfast 90 tablet 3  . rOPINIRole (REQUIP) 4 MG immediate release tablet Take 1 tablet (4 mg total) by mouth at bedtime 90 tablet 3   No facility-administered medications prior to visit.      Objective  Vitals:   09/03/23 1251  BP: 97/61  Pulse: 67  Temp: 36.7 C (98 F)  TempSrc: Oral  Weight: 85.3 kg (188 lb)  Height: 160.4 cm (5' 3.15)  PainSc: 0-No pain   Body mass index is 33.14 kg/m.  Home Vitals:     Physical Exam Physical Exam GENERAL: Alert, cooperative, well developed, no acute distress HEENT: Normocephalic, normal oropharynx, moist mucous membranes CHEST: Clear to auscultation bilaterally, No wheezes, rhonchi, or crackles CARDIOVASCULAR: Normal heart rate and rhythm, S1 and S2 normal without murmurs ABDOMEN: Soft, non-tender, non-distended, without organomegaly, Normal bowel sounds EXTREMITIES: No cyanosis or edema NEUROLOGICAL: Cranial nerves grossly intact, Moves all extremities without gross motor or sensory deficit   Results RADIOLOGY  Leg ultrasound: Improved blood flow from ankles to groin, reduced fibrous tissue, good collateral circulation in pelvis and groin. VENOUS DUPLEX - Deep Vein Thrombosis (DVT) REPORT      TYPE OF EXAMINATION:  Lower Extremity Venous Duplex: Ultrasound evaluation of the deep and  superficial venous systems to rule out a deep or superficial vein thrombosis.  2-D B-Mode imaging is used to demonstrate vein compressions; pulsed-Doppler is used to assess venous flow and color-Doppler to demonstrate luminal filling.   Impression: Patent proximal IVC, mid to distal IVC, bilateral common and external iliac veins notably occluded with flow reconstitution noted at the right common femoral and left deep and superficial femoral veins. Large IVC collateral noted.    Right Lower Extremity:  Venous duplex was performed and revealed evidence of chronic, non occlusive deep vein thrombosis of the common femoral vein and recanalized thrombosis of the posterior tibial and peroneal veins, seen grossly patent.The common femoral was visualized and was partially compressible with continuous venous flow. The femoral and popliteal veins were visualized and were fully compressible with continuous venous flow.     Left Lower Extremity:   Venous duplex was performed and revealed chronic, occlusive thrombosis of the stented common femoral vein, chronic non-occlusive thrombosis of the femoral and popliteal veins and grossly patent posterior tibial and peroneal veins. The common femoral vein was visualized and was not compressible with absent venous flow. The femoral and popliteal veins were visualized and were partially compressible with spontaneous, continuous venous flow.       Patient will be seeing a member of [facility] vascular surgery medical staff immediately after today's exam.    Assessment/Plan:   Assessment & Plan Improvement in leg circulation Significant improvement in leg circulation on ultrasound compared to previous evaluation. Collaterals through pelvis and groin are functioning well, facilitating quicker blood exit from the legs. Reports reduced swelling and increased walking capacity, although legs still tire easily. No interventions required at this time. - Continue Xarelto. - Continue use  of compression socks as needed. - Advise use of emollient to maintain skin condition. - Monitor for signs of lymphedema or swelling; consider pump if symptoms develop. - Order ultrasound for both legs. - Schedule follow-up in one year with repeat ultrasound.  Use of Xarelto for venous insufficiency (IVC) occlusion  Continued use of Xarelto has contributed to improvement in leg circulation by reducing scar tissue and improving blood flow. Adheres to medication regimen consistently. - Continue Xarelto as prescribed. Diagnoses and all orders for this visit:  History of DVT (deep vein thrombosis) -     Vascular Surgery US  venous duplex bilateral; Future  Depression screening (Z13.31) -     Depression Screen -(PHQ- 2/9, BDI)  Inferior vena cava occlusion (CMS/HHS-HCC)   This visit was coded based on medical decision making (MDM).     Return in about 1 year (around 09/02/2024) for Review Ultrasound.     Future Appointments     Date/Time Provider Department Center Visit Type   07/23/2024 9:00 AM (Arrive by 8:45 AM) Johnie Damien Dragon, PA [facility] PHYSICAL   09/01/2024 11:30 AM (Arrive by 11:00 AM) BC US  3 [facility]  VASCULAR   09/01/2024 1:00 PM (Arrive by 12:30 PM) Patt Toribio Johnie, PA [facility] at [facility] CREEK RETURN VISIT       There are no Patient Instructions on file for this visit.  Attestation Statement:   I personally performed the service, non-incident to. (WP)   TORIBIO JULIANNA KERRY, PA   An after visit summary was provided for the patient either in written format (printed) or through MyChart.  This note has been created using automated tools and reviewed for accuracy by TORIBIO JULIANNA KERRY.

## 2023-10-04 ENCOUNTER — Other Ambulatory Visit: Payer: Self-pay

## 2023-10-04 ENCOUNTER — Emergency Department
Admission: EM | Admit: 2023-10-04 | Discharge: 2023-10-04 | Disposition: A | Discharge status: Home / Self Care | Attending: Emergency Medicine | Admitting: Emergency Medicine

## 2023-10-04 ENCOUNTER — Emergency Department

## 2023-10-04 ENCOUNTER — Encounter: Payer: Self-pay | Admitting: *Deleted

## 2023-10-04 DIAGNOSIS — R109 Unspecified abdominal pain: Secondary | ICD-10-CM | POA: Diagnosis present

## 2023-10-04 DIAGNOSIS — Z7901 Long term (current) use of anticoagulants: Secondary | ICD-10-CM | POA: Diagnosis not present

## 2023-10-04 LAB — URINALYSIS, COMPLETE (UACMP) WITH MICROSCOPIC
Bilirubin Urine: NEGATIVE
Glucose, UA: NEGATIVE mg/dL
Hgb urine dipstick: NEGATIVE
Ketones, ur: 20 mg/dL — AB
Leukocytes,Ua: NEGATIVE
Nitrite: NEGATIVE
Protein, ur: NEGATIVE mg/dL
Specific Gravity, Urine: 1.016 (ref 1.005–1.030)
pH: 5 (ref 5.0–8.0)

## 2023-10-04 LAB — CBC
HCT: 42.4 % (ref 36.0–46.0)
Hemoglobin: 14.7 g/dL (ref 12.0–15.0)
MCH: 32.1 pg (ref 26.0–34.0)
MCHC: 34.7 g/dL (ref 30.0–36.0)
MCV: 92.6 fL (ref 80.0–100.0)
Platelets: 321 K/uL (ref 150–400)
RBC: 4.58 MIL/uL (ref 3.87–5.11)
RDW: 13.2 % (ref 11.5–15.5)
WBC: 15.1 K/uL — ABNORMAL HIGH (ref 4.0–10.5)
nRBC: 0 % (ref 0.0–0.2)

## 2023-10-04 LAB — COMPREHENSIVE METABOLIC PANEL WITH GFR
ALT: 13 U/L (ref 0–44)
AST: 27 U/L (ref 15–41)
Albumin: 4.7 g/dL (ref 3.5–5.0)
Alkaline Phosphatase: 88 U/L (ref 38–126)
Anion gap: 12 (ref 5–15)
BUN: 17 mg/dL (ref 6–20)
CO2: 19 mmol/L — ABNORMAL LOW (ref 22–32)
Calcium: 9.6 mg/dL (ref 8.9–10.3)
Chloride: 107 mmol/L (ref 98–111)
Creatinine, Ser: 0.95 mg/dL (ref 0.44–1.00)
GFR, Estimated: >60 mL/min (ref >60)
Glucose, Bld: 135 mg/dL — ABNORMAL HIGH (ref 70–99)
Potassium: 3.9 mmol/L (ref 3.5–5.1)
Sodium: 138 mmol/L (ref 135–145)
Total Bilirubin: 0.7 mg/dL (ref 0.0–1.2)
Total Protein: 7.8 g/dL (ref 6.5–8.1)

## 2023-10-04 LAB — POC URINE PREG, ED: Preg Test, Ur: NEGATIVE

## 2023-10-04 LAB — TROPONIN I (HIGH SENSITIVITY): Troponin I (High Sensitivity): 5 ng/L (ref <18)

## 2023-10-04 LAB — LIPASE, BLOOD: Lipase: 24 U/L (ref 11–51)

## 2023-10-04 MED ORDER — DIPHENHYDRAMINE HCL 25 MG PO CAPS: 50.0000 mg | ORAL_CAPSULE | Freq: Once | ORAL | Status: AC

## 2023-10-04 MED ORDER — IOHEXOL 350 MG/ML SOLN
100.0000 mL | Freq: Once | INTRAVENOUS | Status: AC | PRN
  Administered 2023-10-04: 100 mL via INTRAVENOUS

## 2023-10-04 MED ORDER — FENTANYL CITRATE PF 50 MCG/ML IJ SOSY
100.0000 ug | PREFILLED_SYRINGE | Freq: Once | INTRAMUSCULAR | Status: AC
  Administered 2023-10-04: 100 ug via INTRAVENOUS
  Filled 2023-10-04: qty 2

## 2023-10-04 MED ORDER — DIPHENHYDRAMINE HCL 50 MG/ML IJ SOLN
50.0000 mg | Freq: Once | INTRAMUSCULAR | Status: AC
  Administered 2023-10-04: 50 mg via INTRAVENOUS
  Filled 2023-10-04: qty 1

## 2023-10-04 MED ORDER — ONDANSETRON HCL 4 MG/2ML IJ SOLN
4.0000 mg | Freq: Once | INTRAMUSCULAR | Status: AC
  Administered 2023-10-04: 4 mg via INTRAVENOUS
  Filled 2023-10-04: qty 2

## 2023-10-04 MED ORDER — SODIUM CHLORIDE 0.9 % IV BOLUS
1000.0000 mL | Freq: Once | INTRAVENOUS | Status: AC
  Administered 2023-10-04: 1000 mL via INTRAVENOUS

## 2023-10-04 MED ORDER — HYDROMORPHONE HCL 2 MG PO TABS: 2.0000 mg | ORAL_TABLET | Freq: Two times a day (BID) | ORAL | 0 refills | Status: DC | PRN

## 2023-10-04 MED ORDER — METHYLPREDNISOLONE SODIUM SUCC 40 MG IJ SOLR
40.0000 mg | Freq: Once | INTRAMUSCULAR | Status: AC
  Administered 2023-10-04: 40 mg via INTRAVENOUS
  Filled 2023-10-04: qty 1

## 2023-10-04 NOTE — Discharge Instructions (Signed)
 You were seen in the emergency department for abdominal pain/flank pain.  Workup today was reassuring.  Continue your regular medications.  Follow-up with your vascular team.  Return with any acutely worsening symptoms or any other emergency.  It was very nice meeting you and I wish you the best of luck with everything -- RETURN PRECAUTIONS & AFTERCARE: (ENGLISH) RETURN PRECAUTIONS: Return immediately to the emergency department or see/call your doctor if you feel worse, weak or have changes in speech or vision, are short of breath, have fever, vomiting, pain, bleeding or dark stool, trouble urinating or any new issues. Return here or see/call your doctor if not improving as expected for your suspected condition. FOLLOW-UP CARE: Call your doctor and/or any doctors we referred you to for more advice and to make an appointment. Do this today, tomorrow or after the weekend. Some doctors only take PPO insurance so if you have HMO insurance you may want to contact your HMO or your regular doctor for referral to a specialist within your plan. Either way tell the doctor's office that it was a referral from the emergency department so you get the soonest possible appointment.  YOUR TEST RESULTS: Take result reports of any blood or urine tests, imaging tests and EKG's to your doctor and any referral doctor. Have any abnormal tests repeated. Your doctor or a referral doctor can let you know when this should be done. Also make sure your doctor contacts this hospital to get any test results that are not currently available such as cultures or special tests for infection and final imaging reports, which are often not available at the time you leave the ER but which may list additional important findings that are not documented on the preliminary report. BLOOD PRESSURE: If your blood pressure was greater than 120/80 have your blood pressure rechecked within 1 to 2 weeks. MEDICATION SIDE EFFECTS: Do not drive, walk, bike,  take the bus, etc. if you have received or are being prescribed any sedating medications such as those for pain or anxiety or certain antihistamines like Benadryl . If you have been give one of these here get a taxi home or have a friend drive you home. Ask your pharmacist to counsel you on potential side effects of any new medication

## 2023-10-04 NOTE — ED Provider Notes (Addendum)
 [facility] Provider Note    Event Date/Time   First MD Initiated Contact with Patient 10/04/23 1145     (approximate)  History   Chief Complaint: Abdominal Pain  HPI  Lindsey Willis is a 48 y.o. female with a past medical history of anemia, prior DVT with IVC filter on Xarelto, fibromyalgia, PTSD, presents to the emergency department for right flank pain.  According to the patient approximately 1 to 2 hours ago she developed severe pain in the right flank.  On arrival patient is tearful, mild distress due to pain.  Holding her right flank.  Patient states a history of pancreatitis 20 years ago but states this feels somewhat different.  Patient is status postcholecystectomy approximately 20 years ago as well.  Patient states her pain seems to be lower in the right abdomen/mid abdomen.  Denies any history of kidney stones.  No hematuria or dysuria.  No fever.  Denies any pain in the chest or shortness of breath.  Physical Exam   Triage Vital Signs: ED Triage Vitals  Encounter Vitals Group     BP --      Girls Systolic BP Percentile --      Girls Diastolic BP Percentile --      Boys Systolic BP Percentile --      Boys Diastolic BP Percentile --      Pulse Rate 10/04/23 1145 (!) 121     Resp 10/04/23 1145 (!) 24     Temp 10/04/23 1145 98.8 F (37.1 C)     Temp Source 10/04/23 1145 Oral     SpO2 10/04/23 1145 98 %     Weight --      Height --      Head Circumference --      Peak Flow --      Pain Score 10/04/23 1146 10     Pain Loc --      Pain Education --      Exclude from Growth Chart --     Most recent vital signs: Vitals:   10/04/23 1145  Pulse: (!) 121  Resp: (!) 24  Temp: 98.8 F (37.1 C)  SpO2: 98%    General: Awake, no distress.  CV:  Good peripheral perfusion.  Regular rate and rhythm  Resp:  Normal effort.  Equal breath sounds bilaterally.  Abd:  No distention.  Soft, nontender.  No reaction to abdominal palpation.  States  it feels like the pain is deeper.  ED Results / Procedures / Treatments   EKG  EKG viewed and interpreted by myself shows sinus tachycardia at 106 bpm with a narrow QRS, normal axis, normal intervals, no concerning ST changes  RADIOLOGY  CT renal pending   MEDICATIONS ORDERED IN ED: Medications  sodium chloride  0.9 % bolus 1,000 mL (has no administration in time range)  ondansetron  (ZOFRAN ) injection 4 mg (has no administration in time range)  fentaNYL  (SUBLIMAZE ) injection 100 mcg (has no administration in time range)     IMPRESSION / MDM / ASSESSMENT AND PLAN / ED COURSE  I reviewed the triage vital signs and the nursing notes.  Patient's presentation is most consistent with acute presentation with potential threat to life or bodily function.  Patient presents emergency department for severe acute onset right flank pain starting approximately 1 to 2 hours ago.  Patient states 10/10 pain, tearful.  Patient's pain appears to be situated more in her flank but it does in the center of  her abdomen.  Largely nontender abdomen.  Patient states she takes Xarelto for blood thinners and has not missed any doses.  Given the acute onset of pain we will start with a noncontrasted renal CT scan to first evaluate for possible ureterolithiasis which could definitely explain the patient's symptoms.  Will treat pain check labs and a urine sample.  As a precaution I have added on troponin and EKG as well although patient denies any pain in the chest.   CT renal scan pending.  Patient lab work shows reassuring urinalysis reassuring chemistry.  Lactate slightly elevated at 15, lipase normal.  Troponin negative.  Pregnancy test negative.  Patient appears much improved after receiving pain medication.  Patient care signed out to oncoming provider.  CT renal scan is read as negative.  Patient continues to have pain and states that is recurring I have ordered another dose of pain medication I believe we will  need a contrasted study of the CT chest abdomen and pelvis given the patient's history.  Unfortunately she also has an IV dye allergy so we will have to do a 4-hour prep.    FINAL CLINICAL IMPRESSION(S) / ED DIAGNOSES   Right flank pain   Note:  This document was prepared using Dragon voice recognition software and may include unintentional dictation errors.   Dorothyann Drivers, MD 10/04/23 1515    Dorothyann Drivers, MD 10/04/23 [phone]

## 2023-10-04 NOTE — ED Triage Notes (Signed)
 Pt arrives from home by [facility].  Pt is in severe pain.  Pt has RUQ pain which is severe and it hurts to breathe and it hurts into her shoulder.  Pt has hx of pancreatitis but states that this feels differently.   HR 110, BP 170/124, RR 20, spo2 100% on RA.  Pain 10/10. CBG 129 EKG wnl Pt has hx of PE and is on xarelto.  Last PE was 10 years ago.

## 2023-10-05 ENCOUNTER — Telehealth: Payer: Self-pay | Admitting: Emergency Medicine

## 2023-10-05 MED ORDER — HYDROMORPHONE HCL 2 MG PO TABS: 2.0000 mg | ORAL_TABLET | Freq: Two times a day (BID) | ORAL | 0 refills | Status: AC | PRN

## 2023-10-05 NOTE — ED Provider Notes (Signed)
 Clinical Course as of 10/05/23 0036  Thu Oct 04, 2023  1503 Received signout, Right flank pain. [ ]  pending CT imaging. Pain improved.  [HD]  1716 Urinalysis, Complete w Microscopic -Urine, Clean Catch(!) Not consistent with URI [HD]  1808 Patient sleeping comfortably.  Easily awoke.  Explained that we are still awaiting CT imaging given the need for premedication [HD]  2039 CT Angio Chest/Abd/Pel for Dissection W and/or Wo Contrast No acute abnormality we will counseled the patient, patient does have bilateral iliac vein stents and you cannot totally evaluate for stent thrombosis based on this imaging however patient denies any leg pain and I do not think this is the cause of her symptoms today.  Will advise to follow-up with vascular surgery [HD]  2048 Patient reexamined and states that she has known about the stents being clotted for many years and this is not a new finding.  Repeat abdominal exam is completely benign however patient is worried if the pain should recur.  She has multiple allergies to medications but has tolerated Norco in the past.  I will send her 3 pills.  She will follow-up with her primary care physician and vascular team [HD]  2052 Unfortunately patient's pharmacy does not have Norco and so on chart review patient has tolerated p.o. Dilaudid  before.  I will send her with 24 hours of pills [HD]    Clinical Course User Index [HD] Nicholaus Rolland BRAVO, MD   At time of discharge there is no evidence of acute life, limb, vision, or fertility threat. Patient has stable vital signs, pain is well controlled, patient is ambulatory and p.o. tolerant.  Discharge instructions were completed using the [employer] system. I would refer you to those at this time. All warnings prescriptions follow-up etc. were discussed in detail with the patient. Patient indicates understanding and is agreeable with this plan. All questions answered.  Patient is made aware that they may return to the emergency  department for any worsening or new condition or for any other emergency.    Nicholaus Rolland BRAVO, MD 10/05/23 [phone]

## 2023-10-05 NOTE — Telephone Encounter (Signed)
 [facility] in [place] did not carry the hydromorphone  prescription that was written last night by Dr. Nicholaus.  I have changed this to [facility] in [place] which does [facility] the medication.  [facility] [place] has [facility].

## 2023-11-21 ENCOUNTER — Ambulatory Visit
